# Patient Record
Sex: Female | Born: 1970
Health system: Southern US, Community
[De-identification: ages and names within clinical notes are randomized; demographics above are authoritative.]

## PROBLEM LIST (undated history)

## (undated) DIAGNOSIS — Z800 Family history of malignant neoplasm of digestive organs: Secondary | ICD-10-CM

## (undated) DIAGNOSIS — R0989 Other specified symptoms and signs involving the circulatory and respiratory systems: Secondary | ICD-10-CM

## (undated) DIAGNOSIS — J302 Other seasonal allergic rhinitis: Secondary | ICD-10-CM

## (undated) DIAGNOSIS — K2960 Other gastritis without bleeding: Secondary | ICD-10-CM

## (undated) DIAGNOSIS — Z923 Personal history of irradiation: Secondary | ICD-10-CM

## (undated) DIAGNOSIS — G40909 Epilepsy, unspecified, not intractable, without status epilepticus: Secondary | ICD-10-CM

## (undated) DIAGNOSIS — Z803 Family history of malignant neoplasm of breast: Secondary | ICD-10-CM

## (undated) DIAGNOSIS — D649 Anemia, unspecified: Secondary | ICD-10-CM

## (undated) DIAGNOSIS — Z808 Family history of malignant neoplasm of other organs or systems: Secondary | ICD-10-CM

## (undated) HISTORY — DX: Epilepsy, unspecified, not intractable, without status epilepticus: G40.909

## (undated) HISTORY — DX: Other seasonal allergic rhinitis: J30.2

## (undated) HISTORY — DX: Anemia, unspecified: D64.9

## (undated) HISTORY — DX: Personal history of irradiation: Z92.3

## (undated) HISTORY — DX: Family history of malignant neoplasm of other organs or systems: Z80.8

## (undated) HISTORY — DX: Family history of malignant neoplasm of digestive organs: Z80.0

## (undated) HISTORY — DX: Other specified symptoms and signs involving the circulatory and respiratory systems: R09.89

## (undated) HISTORY — PX: HERNIA REPAIR: SHX51

## (undated) HISTORY — PX: BREAST BIOPSY: SHX20

## (undated) HISTORY — DX: Family history of malignant neoplasm of breast: Z80.3

---

## 1998-01-05 HISTORY — PX: TUBAL LIGATION: SHX77

## 1999-01-06 HISTORY — PX: HAND SURGERY: SHX662

## 2006-01-05 HISTORY — PX: DILATION AND CURETTAGE OF UTERUS: SHX78

## 2007-02-09 ENCOUNTER — Emergency Department (HOSPITAL_COMMUNITY): Admission: EM | Admit: 2007-02-09 | Discharge: 2007-02-09 | Payer: Self-pay | Admitting: Emergency Medicine

## 2007-11-14 ENCOUNTER — Emergency Department (HOSPITAL_BASED_OUTPATIENT_CLINIC_OR_DEPARTMENT_OTHER): Admission: EM | Admit: 2007-11-14 | Discharge: 2007-11-14 | Payer: Self-pay | Admitting: Emergency Medicine

## 2010-07-16 ENCOUNTER — Emergency Department (HOSPITAL_COMMUNITY)
Admission: EM | Admit: 2010-07-16 | Discharge: 2010-07-17 | Disposition: A | Discharge status: Home / Self Care | Payer: Self-pay | Attending: Emergency Medicine | Admitting: Emergency Medicine

## 2010-07-16 DIAGNOSIS — R002 Palpitations: Secondary | ICD-10-CM | POA: Insufficient documentation

## 2010-07-16 DIAGNOSIS — R42 Dizziness and giddiness: Secondary | ICD-10-CM | POA: Insufficient documentation

## 2010-07-16 DIAGNOSIS — G40909 Epilepsy, unspecified, not intractable, without status epilepticus: Secondary | ICD-10-CM | POA: Insufficient documentation

## 2010-07-16 DIAGNOSIS — I4949 Other premature depolarization: Secondary | ICD-10-CM | POA: Insufficient documentation

## 2010-07-17 ENCOUNTER — Emergency Department (HOSPITAL_COMMUNITY)
Admission: EM | Admit: 2010-07-17 | Discharge: 2010-07-17 | Disposition: A | Discharge status: Home / Self Care | Payer: 59 | Attending: Emergency Medicine | Admitting: Emergency Medicine

## 2010-07-17 ENCOUNTER — Emergency Department (HOSPITAL_COMMUNITY): Payer: 59

## 2010-07-17 DIAGNOSIS — R002 Palpitations: Secondary | ICD-10-CM | POA: Insufficient documentation

## 2010-07-17 DIAGNOSIS — R42 Dizziness and giddiness: Secondary | ICD-10-CM | POA: Insufficient documentation

## 2010-07-17 DIAGNOSIS — I4949 Other premature depolarization: Secondary | ICD-10-CM | POA: Insufficient documentation

## 2010-07-17 DIAGNOSIS — G40909 Epilepsy, unspecified, not intractable, without status epilepticus: Secondary | ICD-10-CM | POA: Insufficient documentation

## 2010-07-17 LAB — BASIC METABOLIC PANEL
BUN: 12 mg/dL (ref 6–23)
CO2: 25 mEq/L (ref 19–32)
Calcium: 9.2 mg/dL (ref 8.4–10.5)
Chloride: 103 mEq/L (ref 96–112)
Creatinine, Ser: 0.57 mg/dL (ref 0.50–1.10)
GFR calc Af Amer: >60 mL/min (ref >60)
GFR calc non Af Amer: >60 mL/min (ref >60)
Glucose, Bld: 93 mg/dL (ref 70–99)
Potassium: 3.8 mEq/L (ref 3.5–5.1)
Sodium: 138 mEq/L (ref 135–145)

## 2010-07-17 LAB — COMPREHENSIVE METABOLIC PANEL
ALT: 20 U/L (ref 0–35)
AST: 18 U/L (ref 0–37)
Albumin: 3.8 g/dL (ref 3.5–5.2)
Alkaline Phosphatase: 49 U/L (ref 39–117)
BUN: 10 mg/dL (ref 6–23)
CO2: 24 mEq/L (ref 19–32)
Calcium: 9 mg/dL (ref 8.4–10.5)
Chloride: 105 mEq/L (ref 96–112)
Creatinine, Ser: 0.62 mg/dL (ref 0.50–1.10)
GFR calc Af Amer: >60 mL/min (ref >60)
GFR calc non Af Amer: >60 mL/min (ref >60)
Glucose, Bld: 104 mg/dL — ABNORMAL HIGH (ref 70–99)
Potassium: 3.5 mEq/L (ref 3.5–5.1)
Sodium: 139 mEq/L (ref 135–145)
Total Bilirubin: 0.4 mg/dL (ref 0.3–1.2)
Total Protein: 7.1 g/dL (ref 6.0–8.3)

## 2010-07-17 LAB — MAGNESIUM: Magnesium: 1.8 mg/dL (ref 1.5–2.5)

## 2010-07-17 LAB — TROPONIN I: Troponin I: <0.3 ng/mL (ref <0.30)

## 2010-07-23 ENCOUNTER — Encounter: Payer: Self-pay | Admitting: Internal Medicine

## 2010-07-23 ENCOUNTER — Ambulatory Visit (INDEPENDENT_AMBULATORY_CARE_PROVIDER_SITE_OTHER): Payer: 59 | Admitting: Internal Medicine

## 2010-07-23 VITALS — BP 113/68 | HR 74 | Ht 66.0 in | Wt 158.0 lb

## 2010-07-23 DIAGNOSIS — I4949 Other premature depolarization: Secondary | ICD-10-CM

## 2010-07-23 DIAGNOSIS — I493 Ventricular premature depolarization: Secondary | ICD-10-CM | POA: Insufficient documentation

## 2010-07-23 MED ORDER — VERAPAMIL HCL 120 MG PO TABS: 120.0000 mg | ORAL_TABLET | Freq: Three times a day (TID) | ORAL | Status: DC

## 2010-07-23 MED ORDER — METOPROLOL TARTRATE 25 MG PO TABS: 25.0000 mg | ORAL_TABLET | Freq: Two times a day (BID) | ORAL | Status: DC

## 2010-07-23 MED ORDER — ATENOLOL 25 MG PO TABS: 25.0000 mg | ORAL_TABLET | Freq: Every day | ORAL | Status: DC

## 2010-07-23 NOTE — Assessment & Plan Note (Signed)
The patient has been having quite symptomatic PVCs relatively abruptly in onset over the last week or so. Their morphology suggests a right bundle branch appearance consistent with a left ventricular origin. Clarification of the morphology would be helpful. As they've been somewhat responsive to exertion, stress testing will be undertaken to see if we can provoke them.  We'll also undertaken echo to look at left ventricular function to see if we can identify any problems that might have given rise to PVCs from that chamber which is somewhat unusual.  The relative infrequency would make catheter ablation difficult. In the event that she is intolerant of calcium blockers and/or beta blockers I would consider antiarrhythmic therapy once we know that ventricular function is normal. I told the patient that these are likely benign. Frequently they can wax and wane. Biofeedback may be part of a therapeutic strategy.

## 2010-07-23 NOTE — Progress Notes (Signed)
HPI: Jenny Hudson is a 40 y.o. female Seen at the request of the emergency room because of PVCs.  She first noted irregular palpitations 4 or 6 weeks ago they were transient. About a week ago they started returning. They're associated with shortness of breath lightheadedness and vague chest discomfort. He would come every 10-15 minutes or so. Occasionally he became as frequent as trigeminy. Because of symptoms that she presented herself to the emergency room where elective cardioversion was obtained according isolated beat. Laboratory evaluation included over 2 visits normal metabolic profile normal magnesium normal CBC and a normal chest x-ray and negative cardiac enzymes.  She denies significant change in stress or caffeine intake. She's had no problems with exercise tolerance. Current Outpatient Prescriptions  Medication Sig Dispense Refill  . metoprolol succinate (TOPROL-XL) 12.5 mg TB24 Take 12.5 mg by mouth 2 (two) times daily.          Allergies  Allergen Reactions  . Sulfa Antibiotics     Past Medical History  Diagnosis Date  . Chest pain   . Pulse irregularity   . Epilepsy   . Seasonal allergies   . Asthma   . Anemia     Past Surgical History  Procedure Date  . Dilation and curettage of uterus 2008  . Tubal ligation 2000  . Hand surgery 2001  . Hernia repair age 40    Family History  Problem Relation Age of Onset  . Breast cancer Mother     History   Social History  . Marital Status: Divorced    Spouse Name: N/A    Number of Children: 4  . Years of Education: N/A   Occupational History  . RN [employer]   Social History Main Topics  . Smoking status: Former Games developer  . Smokeless tobacco: Never Used   Comment: quit jan 2012  . Alcohol Use: Not on file  . Drug Use: Not on file  . Sexually Active: Not on file   Other Topics Concern  . Not on file   Social History Narrative  . No narrative on file    Fourteen point review of systems  was negative except as noted in HPI and PMH   PHYSICAL EXAMINATION  Blood pressure 113/68, pulse 74, height 5\' 6"  (1.676 m), weight 158 lb (71.668 kg).   Well developed and nourished Middle-aged Caucasian female in no acute distress HENT normal Neck supple with JVP-flat Carotids brisk and full without bruits Back without scoliosis or kyphosis Clear Regular rate and rhythm, no murmurs or gallops Abd-soft with active BS without hepatomegaly or midline pulsation Femoral pulses 2+ distal pulses intact No Clubbing cyanosis edema Skin-warm and dry LN-neg submandibular and supraclavicular A & Oriented CN 3-12 normal  Grossly normal sensory and motor function Affect engaging . Electrocardiogram dated 711 demonstrated sinus rhythm at 77 with intervals of 0.14/0.11/0.41 there is an RSR prime. PVC morphology was biphasic in lead V4 V5 and V6 with an increasing amplitude of the S-wave.

## 2010-07-23 NOTE — Patient Instructions (Signed)
Your physician recommends that you schedule a follow-up appointment in: 6 weeks  Your physician has requested that you have an echocardiogram. Echocardiography is a painless test that uses sound waves to create images of your heart. It provides your doctor with information about the size and shape of your heart and how well your heart's chambers and valves are working. This procedure takes approximately one hour. There are no restrictions for this procedure.  Your physician has requested that you have an exercise tolerance test. For further information please visit [email or web address] Please also follow instruction sheet, as given. With Tereso Newcomer or Dr Graciela Husbands  Your physician has recommended you make the following change in your medication: try Metoprolol Tartrate, Verapamil & Atenolol and let us know which one works best for you

## 2010-07-24 ENCOUNTER — Other Ambulatory Visit: Payer: 59 | Admitting: *Deleted

## 2010-07-28 ENCOUNTER — Other Ambulatory Visit (INDEPENDENT_AMBULATORY_CARE_PROVIDER_SITE_OTHER): Payer: 59 | Admitting: *Deleted

## 2010-07-28 DIAGNOSIS — I4949 Other premature depolarization: Secondary | ICD-10-CM

## 2010-07-28 DIAGNOSIS — I493 Ventricular premature depolarization: Secondary | ICD-10-CM

## 2010-07-28 LAB — TSH: TSH: 1.63 u[IU]/mL (ref 0.35–5.50)

## 2010-07-31 ENCOUNTER — Ambulatory Visit (HOSPITAL_COMMUNITY): Payer: 59 | Attending: Internal Medicine | Admitting: Radiology

## 2010-07-31 DIAGNOSIS — I079 Rheumatic tricuspid valve disease, unspecified: Secondary | ICD-10-CM | POA: Insufficient documentation

## 2010-07-31 DIAGNOSIS — I493 Ventricular premature depolarization: Secondary | ICD-10-CM

## 2010-07-31 DIAGNOSIS — I4949 Other premature depolarization: Secondary | ICD-10-CM | POA: Insufficient documentation

## 2010-08-06 ENCOUNTER — Institutional Professional Consult (permissible substitution): Payer: Self-pay | Admitting: Internal Medicine

## 2010-08-12 ENCOUNTER — Ambulatory Visit (INDEPENDENT_AMBULATORY_CARE_PROVIDER_SITE_OTHER): Payer: 59 | Admitting: Physician Assistant

## 2010-08-12 DIAGNOSIS — I493 Ventricular premature depolarization: Secondary | ICD-10-CM

## 2010-08-12 DIAGNOSIS — I4949 Other premature depolarization: Secondary | ICD-10-CM

## 2010-08-12 NOTE — Progress Notes (Signed)
Exercise Treadmill Test  Pre-Exercise Testing Evaluation Rhythm: normal sinus  Rate: 85   PR:  .13 QRS:  .10  QT:  .37 QTc: .44     Test  Exercise Tolerance Test Ordering MD: Sherryl Manges, MD  Interpreting MD:  Tereso Newcomer PA-C  Unique Test No: 1  Treadmill:  1  Indication for ETT: PALPS  Contraindication to ETT: No   Stress Modality: exercise - treadmill  Cardiac Imaging Performed: non   Protocol: standard Bruce - maximal  Max BP: 147/65  Max MPHR (bpm):  182 85% MPR (bpm):  154  MPHR obtained (bpm)144 % MPHR obtained: 90  Reached 85% MPHR (min:sec):  3:50 Total Exercise Time (min-sec): 6:00  Workload in METS:  10.1 Borg Scale: 15  Reason ETT Terminated:  patient's desire to stop    ST Segment Analysis At Rest: normal ST segments - no evidence of significant ST depression With Exercise: no evidence of significant ST depression  Other Information Arrhythmia:  Occasional PVCs Angina during ETT:  absent (0) Quality of ETT:  diagnostic  ETT Interpretation:  normal - no evidence of ischemia by ST analysis  Comments: Good exercise tolerance. Normal BP response to exercise. No chest pain. No ST-T changes to suggest ischemia. Occasional PVCs noted at rest and during exercise. No increase in frequency noted with exercise. No ventricular arrhythmias with exercise.  Recommendations: Follow up with Dr. Graciela Husbands as directed.

## 2010-09-26 LAB — POCT URINALYSIS DIP (DEVICE)
Glucose, UA: NEGATIVE
Nitrite: NEGATIVE
Operator id: 239701
Protein, ur: 30 — AB
Specific Gravity, Urine: >=1.03
Urobilinogen, UA: 1
pH: 5

## 2010-12-09 ENCOUNTER — Other Ambulatory Visit: Payer: Self-pay

## 2010-12-09 ENCOUNTER — Encounter (HOSPITAL_COMMUNITY): Payer: Self-pay | Admitting: *Deleted

## 2010-12-09 ENCOUNTER — Emergency Department (HOSPITAL_COMMUNITY): Payer: 59

## 2010-12-09 ENCOUNTER — Emergency Department (HOSPITAL_COMMUNITY)
Admission: EM | Admit: 2010-12-09 | Discharge: 2010-12-10 | Disposition: A | Discharge status: Home / Self Care | Payer: 59 | Attending: Emergency Medicine | Admitting: Emergency Medicine

## 2010-12-09 DIAGNOSIS — R002 Palpitations: Secondary | ICD-10-CM | POA: Insufficient documentation

## 2010-12-09 DIAGNOSIS — J45909 Unspecified asthma, uncomplicated: Secondary | ICD-10-CM | POA: Insufficient documentation

## 2010-12-09 DIAGNOSIS — R209 Unspecified disturbances of skin sensation: Secondary | ICD-10-CM | POA: Insufficient documentation

## 2010-12-09 DIAGNOSIS — R0789 Other chest pain: Secondary | ICD-10-CM

## 2010-12-09 DIAGNOSIS — Z79899 Other long term (current) drug therapy: Secondary | ICD-10-CM | POA: Insufficient documentation

## 2010-12-09 DIAGNOSIS — R42 Dizziness and giddiness: Secondary | ICD-10-CM | POA: Insufficient documentation

## 2010-12-09 DIAGNOSIS — G40909 Epilepsy, unspecified, not intractable, without status epilepticus: Secondary | ICD-10-CM | POA: Insufficient documentation

## 2010-12-09 LAB — COMPREHENSIVE METABOLIC PANEL
ALT: 14 U/L (ref 0–35)
AST: 15 U/L (ref 0–37)
Albumin: 4.2 g/dL (ref 3.5–5.2)
Alkaline Phosphatase: 47 U/L (ref 39–117)
BUN: 16 mg/dL (ref 6–23)
CO2: 22 mEq/L (ref 19–32)
Calcium: 9.2 mg/dL (ref 8.4–10.5)
Chloride: 104 mEq/L (ref 96–112)
Creatinine, Ser: 0.61 mg/dL (ref 0.50–1.10)
GFR calc Af Amer: >90 mL/min (ref >90)
GFR calc non Af Amer: >90 mL/min (ref >90)
Glucose, Bld: 88 mg/dL (ref 70–99)
Potassium: 3.7 mEq/L (ref 3.5–5.1)
Sodium: 136 mEq/L (ref 135–145)
Total Bilirubin: 0.2 mg/dL — ABNORMAL LOW (ref 0.3–1.2)
Total Protein: 7.4 g/dL (ref 6.0–8.3)

## 2010-12-09 LAB — CBC
HCT: 35.8 % — ABNORMAL LOW (ref 36.0–46.0)
Hemoglobin: 11.9 g/dL — ABNORMAL LOW (ref 12.0–15.0)
MCH: 27.3 pg (ref 26.0–34.0)
MCHC: 33.2 g/dL (ref 30.0–36.0)
MCV: 82.1 fL (ref 78.0–100.0)
Platelets: 215 10*3/uL (ref 150–400)
RBC: 4.36 MIL/uL (ref 3.87–5.11)
RDW: 14.9 % (ref 11.5–15.5)
WBC: 7.4 10*3/uL (ref 4.0–10.5)

## 2010-12-09 LAB — D-DIMER, QUANTITATIVE: D-Dimer, Quant: 0.48 ug/mL-FEU (ref 0.00–0.48)

## 2010-12-09 LAB — DIFFERENTIAL
Basophils Absolute: 0.1 10*3/uL (ref 0.0–0.1)
Basophils Relative: 1 % (ref 0–1)
Eosinophils Absolute: 0.4 10*3/uL (ref 0.0–0.7)
Eosinophils Relative: 5 % (ref 0–5)
Lymphocytes Relative: 45 % (ref 12–46)
Lymphs Abs: 3.3 10*3/uL (ref 0.7–4.0)
Monocytes Absolute: 0.7 10*3/uL (ref 0.1–1.0)
Monocytes Relative: 9 % (ref 3–12)
Neutro Abs: 3 10*3/uL (ref 1.7–7.7)
Neutrophils Relative %: 41 % — ABNORMAL LOW (ref 43–77)

## 2010-12-09 LAB — CARDIAC PANEL(CRET KIN+CKTOT+MB+TROPI)
CK, MB: 2 ng/mL (ref 0.3–4.0)
Relative Index: INVALID (ref 0.0–2.5)
Total CK: 75 U/L (ref 7–177)
Troponin I: <0.3 ng/mL (ref <0.30)

## 2010-12-09 MED ORDER — MORPHINE SULFATE 4 MG/ML IJ SOLN
4.0000 mg | Freq: Once | INTRAMUSCULAR | Status: AC
  Administered 2010-12-09: 2 mg via INTRAVENOUS
  Filled 2010-12-09: qty 1

## 2010-12-09 MED ORDER — MORPHINE SULFATE 2 MG/ML IJ SOLN
2.0000 mg | Freq: Once | INTRAMUSCULAR | Status: AC
  Administered 2010-12-09: 2 mg via INTRAVENOUS
  Filled 2010-12-09: qty 1

## 2010-12-09 MED ORDER — ONDANSETRON HCL 4 MG/2ML IJ SOLN
4.0000 mg | Freq: Once | INTRAMUSCULAR | Status: AC
  Administered 2010-12-09: 4 mg via INTRAVENOUS
  Filled 2010-12-09: qty 2

## 2010-12-09 MED ORDER — ASPIRIN 81 MG PO CHEW
324.0000 mg | CHEWABLE_TABLET | Freq: Once | ORAL | Status: AC
  Administered 2010-12-09: 324 mg via ORAL
  Filled 2010-12-09: qty 4

## 2010-12-09 NOTE — ED Provider Notes (Signed)
History     CSN: [id number] Arrival date & time: 12/09/2010  9:22 PM   First MD Initiated Contact with Patient 12/09/10 2125      Chief Complaint  Patient presents with  . Chest Pain    (Consider location/radiation/quality/duration/timing/severity/associated sxs/prior treatment) HPI  40 -year-old female previously healthy presents with chest pain. The patient is complaining of sudden onset left-sided sharp chest pain with radiation straight back to her scapula. She is experiencing intermittent left hand paresthesias.  She states that the pain "takes her breath away" she is not having any pleurisy. Describes the pain as 8/10 at this time. At the beginning of this episode she did have some lightheadedness. She states that she also feels like her heart is skipping a beat. She's had similar in the past and has been diagnosed with frequent PVCs has been prescribed metoprolol states that she has not taken it secondary to side effects. She denies history of hypertension, hyperlipidemia. There is no family history of heart attacks. Denies h/o VTE in self or family. No recent hosp/surg/immob. No h/o cancer. Denies exogenous hormone use, no leg pain or swelling.  Patient had a normal stress test in 08/2010 without induced ischemia. She had occasional PVCs noted at that time.  ED Notes, ED Provider Notes from 12/09/10 0000 to 12/09/10 21:26:18       Jillyn Ledger, RN 12/09/2010 21:25      Pt states that she was driving home and started to have chest pains and heart palpations. Pt states that she has a history of PVC's. Pt states that her pain radiated slightly to her left side of her chest and then her left hand was tingly. Pt states that the pain is an 8/10 and that she feels like it is taking her breath away.     Past Medical History  Diagnosis Date  . Chest pain   . Pulse irregularity   . Epilepsy   . Seasonal allergies   . Asthma   . Anemia     Past Surgical History  Procedure Date  .  Dilation and curettage of uterus 2008  . Tubal ligation 2000  . Hand surgery 2001  . Hernia repair age 28    Family History  Problem Relation Age of Onset  . Breast cancer Mother     History  Substance Use Topics  . Smoking status: Former Games developer  . Smokeless tobacco: Never Used   Comment: quit jan 2012  . Alcohol Use: Yes    OB History    Grav Para Term Preterm Abortions TAB SAB Ect Mult Living                  Review of Systems  All other systems reviewed and are negative.   except as noted HPI   Allergies  Sulfa antibiotics  Home Medications   Current Outpatient Rx  Name Route Sig Dispense Refill  . ATENOLOL 25 MG PO TABS Oral Take 25 mg by mouth daily.      Marland Kitchen METOPROLOL TARTRATE 25 MG PO TABS Oral Take 25 mg by mouth 2 (two) times daily.      Marland Kitchen VERAPAMIL HCL 120 MG PO TABS Oral Take 120 mg by mouth 3 (three) times daily.        BP 100/56  Pulse 79  Temp(Src) 98.2 F (36.8 C) (Oral)  Resp 12  SpO2 98% HR 74 by EKG  Physical Exam  Nursing note and vitals reviewed. Constitutional: She is oriented  to person, place, and time. She appears well-developed.  HENT:  Head: Atraumatic.  Mouth/Throat: Oropharynx is clear and moist.  Eyes: Conjunctivae and EOM are normal. Pupils are equal, round, and reactive to light.  Neck: Normal range of motion. Neck supple.  Cardiovascular: Normal rate, regular rhythm, normal heart sounds and intact distal pulses.   Pulmonary/Chest: Effort normal and breath sounds normal. No respiratory distress. She has no wheezes. She has no rales. She exhibits no tenderness.  Abdominal: Soft. She exhibits no distension. There is no tenderness. There is no rebound and no guarding.  Musculoskeletal: Normal range of motion.  Neurological: She is alert and oriented to person, place, and time.  Skin: Skin is warm and dry. No rash noted.  Psychiatric: She has a normal mood and affect.    Date: 12/09/2010  Rate: 74  Rhythm: normal sinus  rhythm  QRS Axis: normal  Intervals: normal  ST/T Wave abnormalities: normal  Conduction Disutrbances:none  Narrative Interpretation: incomplete RBBB, borderline inferior q waves, borderline short PR interval  Old EKG Reviewed: unchanged   ED Course  Procedures (including critical care time)  Labs Reviewed  CBC - Abnormal; Notable for the following:    Hemoglobin 11.9 (*)    HCT 35.8 (*)    All other components within normal limits  DIFFERENTIAL - Abnormal; Notable for the following:    Neutrophils Relative 41 (*)    All other components within normal limits  COMPREHENSIVE METABOLIC PANEL - Abnormal; Notable for the following:    Total Bilirubin 0.2 (*)    All other components within normal limits  CARDIAC PANEL(CRET KIN+CKTOT+MB+TROPI)  D-DIMER, QUANTITATIVE   Dg Chest 2 View  12/09/2010  *RADIOLOGY REPORT*  Clinical Data: Left-sided chest pain and pressure.  CHEST - 2 VIEW  Comparison: Chest radiograph performed 07/17/2010  Findings: The lungs are well-aerated and clear.  There is no evidence of focal opacification, pleural effusion or pneumothorax.  The heart is normal in size; the mediastinal contour is within normal limits.  No acute osseous abnormalities are seen.  IMPRESSION: No acute cardiopulmonary process seen.  Original Report Authenticated By: Tonia Ghent, M.D.    No diagnosis found.   MDM  Atypical chest pain with palpitations. CXR and EKG unremarkable. Dimer negative. Labs pending. Morphine, asa given. Reassess.  Pain 3/10 at this time. Repeat morphine. The patient has a TIMI 0 she had negative stress test in August of this year. She is also very lower for dissection, pulmonary embolism. She is a negative d-dimer here. There have been no episodes of arrhythmias noted here on the monitor. Plans for a second set of cardiac enzymes. Anticipate discharge home if her pain is controlled. F/U with Dr. Meredith Mody for PVCs  Signed out to Dr. Diamond Nickel,  MD 12/10/10 0002

## 2010-12-09 NOTE — ED Notes (Signed)
Pt states that she was driving home and started to have chest pains and heart palpations. Pt states that she has a history of PVC's. Pt states that her pain radiated slightly to her left side of her chest and then her left hand was tingly. Pt states that the pain is an 8/10 and that she feels like it is taking her breath away.

## 2010-12-09 NOTE — ED Notes (Signed)
New and old ECG to Dr Hyman Hopes

## 2010-12-09 NOTE — ED Notes (Signed)
Pt given 2mg  of morphine to complete dose of 4mg  ordered at 22:23. Pt states that the pain is a 3/10 still some achyness.

## 2010-12-10 LAB — TROPONIN I: Troponin I: <0.3 ng/mL (ref <0.30)

## 2012-03-10 ENCOUNTER — Encounter (HOSPITAL_COMMUNITY): Payer: Self-pay | Admitting: *Deleted

## 2012-03-10 ENCOUNTER — Emergency Department (HOSPITAL_COMMUNITY)
Admission: EM | Admit: 2012-03-10 | Discharge: 2012-03-10 | Disposition: A | Discharge status: Home Health | Payer: BC Managed Care – PPO | Attending: Emergency Medicine | Admitting: Emergency Medicine

## 2012-03-10 ENCOUNTER — Emergency Department (HOSPITAL_COMMUNITY): Payer: BC Managed Care – PPO

## 2012-03-10 DIAGNOSIS — J45909 Unspecified asthma, uncomplicated: Secondary | ICD-10-CM | POA: Insufficient documentation

## 2012-03-10 DIAGNOSIS — R002 Palpitations: Secondary | ICD-10-CM | POA: Insufficient documentation

## 2012-03-10 DIAGNOSIS — G40909 Epilepsy, unspecified, not intractable, without status epilepticus: Secondary | ICD-10-CM | POA: Insufficient documentation

## 2012-03-10 DIAGNOSIS — R5383 Other fatigue: Secondary | ICD-10-CM

## 2012-03-10 DIAGNOSIS — R079 Chest pain, unspecified: Secondary | ICD-10-CM | POA: Insufficient documentation

## 2012-03-10 DIAGNOSIS — Z87891 Personal history of nicotine dependence: Secondary | ICD-10-CM | POA: Insufficient documentation

## 2012-03-10 DIAGNOSIS — Z3202 Encounter for pregnancy test, result negative: Secondary | ICD-10-CM | POA: Insufficient documentation

## 2012-03-10 DIAGNOSIS — Z79899 Other long term (current) drug therapy: Secondary | ICD-10-CM | POA: Insufficient documentation

## 2012-03-10 DIAGNOSIS — R110 Nausea: Secondary | ICD-10-CM | POA: Insufficient documentation

## 2012-03-10 DIAGNOSIS — Z862 Personal history of diseases of the blood and blood-forming organs and certain disorders involving the immune mechanism: Secondary | ICD-10-CM | POA: Insufficient documentation

## 2012-03-10 DIAGNOSIS — R5381 Other malaise: Secondary | ICD-10-CM | POA: Insufficient documentation

## 2012-03-10 LAB — BASIC METABOLIC PANEL
BUN: 10 mg/dL (ref 6–23)
CO2: 23 mEq/L (ref 19–32)
Calcium: 9.3 mg/dL (ref 8.4–10.5)
Chloride: 104 mEq/L (ref 96–112)
Creatinine, Ser: 0.65 mg/dL (ref 0.50–1.10)
GFR calc Af Amer: >90 mL/min (ref >90)
GFR calc non Af Amer: >90 mL/min (ref >90)
Glucose, Bld: 90 mg/dL (ref 70–99)
Potassium: 4.2 mEq/L (ref 3.5–5.1)
Sodium: 139 mEq/L (ref 135–145)

## 2012-03-10 LAB — CBC
HCT: 34.8 % — ABNORMAL LOW (ref 36.0–46.0)
Hemoglobin: 11.7 g/dL — ABNORMAL LOW (ref 12.0–15.0)
MCH: 27.4 pg (ref 26.0–34.0)
MCHC: 33.6 g/dL (ref 30.0–36.0)
MCV: 81.5 fL (ref 78.0–100.0)
Platelets: 228 10*3/uL (ref 150–400)
RBC: 4.27 MIL/uL (ref 3.87–5.11)
RDW: 14.2 % (ref 11.5–15.5)
WBC: 5.3 10*3/uL (ref 4.0–10.5)

## 2012-03-10 LAB — HEPATIC FUNCTION PANEL
ALT: 17 U/L (ref 0–35)
AST: 18 U/L (ref 0–37)
Albumin: 3.9 g/dL (ref 3.5–5.2)
Alkaline Phosphatase: 43 U/L (ref 39–117)
Bilirubin, Direct: <0.1 mg/dL (ref 0.0–0.3)
Total Bilirubin: 0.4 mg/dL (ref 0.3–1.2)
Total Protein: 7.2 g/dL (ref 6.0–8.3)

## 2012-03-10 LAB — URINALYSIS, ROUTINE W REFLEX MICROSCOPIC
Bilirubin Urine: NEGATIVE
Glucose, UA: NEGATIVE mg/dL
Hgb urine dipstick: NEGATIVE
Ketones, ur: NEGATIVE mg/dL
Leukocytes, UA: NEGATIVE
Nitrite: NEGATIVE
Protein, ur: NEGATIVE mg/dL
Specific Gravity, Urine: 1.01 (ref 1.005–1.030)
Urobilinogen, UA: 1 mg/dL (ref 0.0–1.0)
pH: 7.5 (ref 5.0–8.0)

## 2012-03-10 LAB — POCT I-STAT TROPONIN I: Troponin i, poc: 0 ng/mL (ref 0.00–0.08)

## 2012-03-10 LAB — POCT PREGNANCY, URINE: Preg Test, Ur: NEGATIVE

## 2012-03-10 MED ORDER — ONDANSETRON HCL 4 MG/2ML IJ SOLN
4.0000 mg | Freq: Once | INTRAMUSCULAR | Status: AC
  Administered 2012-03-10: 4 mg via INTRAVENOUS
  Filled 2012-03-10: qty 2

## 2012-03-10 MED ORDER — NITROGLYCERIN 0.4 MG SL SUBL: 0.4000 mg | SUBLINGUAL_TABLET | SUBLINGUAL | Status: DC | PRN

## 2012-03-10 MED ORDER — KETOROLAC TROMETHAMINE 30 MG/ML IJ SOLN
30.0000 mg | Freq: Once | INTRAMUSCULAR | Status: AC
  Administered 2012-03-10: 30 mg via INTRAVENOUS
  Filled 2012-03-10: qty 1

## 2012-03-10 MED ORDER — SODIUM CHLORIDE 0.9 % IV BOLUS (SEPSIS)
1000.0000 mL | INTRAVENOUS | Status: AC
  Administered 2012-03-10: 1000 mL via INTRAVENOUS

## 2012-03-10 MED ORDER — NITROGLYCERIN 0.4 MG SL SUBL
0.4000 mg | SUBLINGUAL_TABLET | SUBLINGUAL | Status: DC | PRN
  Administered 2012-03-10: 0.4 mg via SUBLINGUAL
  Filled 2012-03-10 (×2): qty 25

## 2012-03-10 MED ORDER — ASPIRIN 325 MG PO TABS
325.0000 mg | ORAL_TABLET | ORAL | Status: AC
  Administered 2012-03-10: 325 mg via ORAL
  Filled 2012-03-10: qty 1

## 2012-03-10 MED ORDER — MELOXICAM 7.5 MG PO TABS: 7.5000 mg | ORAL_TABLET | Freq: Every day | ORAL | Status: DC

## 2012-03-10 NOTE — Discharge Instructions (Signed)
Cardiac Arrhythmia  Your heart is a muscle that works to pump blood through your body by regular contractions. The beating of your heart is controlled by a system of special pacemaker cells. These cells control the electrical activity of the heart. When the system controlling this regular beating is disturbed, a heart rhythm abnormality (arrhythmia) results.  WHEN YOUR HEART SKIPS A BEAT  One of the most common and least serious heart arrhythmias is called an ectopic or premature atrial heartbeat (PAC). This may be noticed as a small change in your regular pulse. A PAC originates from the top part (atrium) of the heart. Within the right atrium, the SA node is the area that normally controls the regularity of the heart. PACs occur in heart tissue outside of the SA node region. You may feel this as a skipped beat or heart flutter, especially if several occur in succession or occur frequently.   Another arrhythmia is ventricular premature complex (VCP or PVC). These extra beats start out in the bottom, more muscular chambers of the heart. In most cases a PVC is harmless. If there are underlying causes that are making the heart irritable such as an overactive thyroid or a prior heart attack PVCs may be of more concern. In a few cases, medications to control the heart rhythm may be prescribed.  Things to try at home:   Cut down or avoid alcohol, tobacco and caffeine.   Get enough sleep.   Reduce stress.   Exercise more.  WHEN THE HEART BEATS TOO FAST  Atrial tachycardia is a fast heart rate, which starts out in the atrium. It may last from minutes to much longer. Your heart may beat 140 to 240 times per minute instead of the normal 60 to 100.   Symptoms include a worried feeling (anxiety) and a sense that your heart is beating fast and hard.   You may be able to stop the fast rate by holding your breath or bearing down as if you were going to have a bowel movement.   This type of fast rate is usually not  dangerous.  Atrial fibrillation and atrial flutter are other fast rhythms that start in the atria. Both conditions keep the atria from filling with enough blood so the heart does not work well.   Symptoms include feeling light-headed or faint.   These fast rates may be the result of heart damage or disease. Too much thyroid hormone may play a role.   There may be no clear cause or it may be from heart disease or damage.   Medication or a special electrical treatment (cardioversion) may be needed to get the heart beating normally.  Ventricular tachycardia is a fast heart rate that starts in the lower muscular chambers (ventricles) This is a serious disorder that requires treatment as soon as possible. You need someone else to get and use a small defibrillator.   Symptoms include collapse, chest pain, or being short of breath.   Treatment may include medication, procedures to improve blood flow to the heart, or an implantable cardiac defibrillator (ICD).  DIAGNOSIS    A cardiogram (EKG or ECG) will be done to see the arrhythmia, as well as lab tests to check the underlying cause.   If the extra beats or fast rate come and go, you may wear a Holter monitor that records your heart rate for a longer period of time.  SEEK MEDICAL CARE IF:   You have irregular or fast heartbeats (palpitations).     you are already being treated. SEEK IMMEDIATE MEDICAL CARE IF:   You have severe chest pain, especially if the pain is crushing or pressure-like and spreads to the arms, back, neck, or jaw, or if you have sweating, feeling sick to your stomach (nausea), or shortness of breath. THIS IS AN EMERGENCY. Do not wait to see if the pain will go away. Get medical help at once. Call 911 or 0 (operator). DO NOT drive yourself to the hospital.  You feel dizzy or  faint.  You have episodes of previously documented atrial tachycardia that do not resolve with the techniques your caregiver has taught you.  Irregular or rapid heartbeats begin to occur more often than in the past, especially if they are associated with more pronounced symptoms or of longer duration. Document Released: 12/22/2004 Document Revised: 03/16/2011 Document Reviewed: 08/10/2007 [facility] Patient Information 2013 [place], [place].   Fatigue Fatigue is a feeling of tiredness, lack of energy, lack of motivation, or feeling tired all the time. Having enough rest, good nutrition, and reducing stress will normally reduce fatigue. Consult your caregiver if it persists. The nature of your fatigue will help your caregiver to find out its cause. The treatment is based on the cause.  CAUSES  There are many causes for fatigue. Most of the time, fatigue can be traced to one or more of your habits or routines. Most causes fit into one or more of three general areas. They are: Lifestyle problems  Sleep disturbances.  Overwork.  Physical exertion.  Unhealthy habits.  Poor eating habits or eating disorders.  Alcohol and/or drug use .  Lack of proper nutrition (malnutrition). Psychological problems  Stress and/or anxiety problems.  Depression.  Grief.  Boredom. Medical Problems or Conditions  Anemia.  Pregnancy.  Thyroid gland problems.  Recovery from major surgery.  Continuous pain.  Emphysema or asthma that is not well controlled  Allergic conditions.  Diabetes.  Infections (such as mononucleosis).  Obesity.  Sleep disorders, such as sleep apnea.  Heart failure or other heart-related problems.  Cancer.  Kidney disease.  Liver disease.  Effects of certain medicines such as antihistamines, cough and cold remedies, prescription pain medicines, heart and blood pressure medicines, drugs used for treatment of cancer, and some antidepressants. SYMPTOMS  The  symptoms of fatigue include:   Lack of energy.  Lack of drive (motivation).  Drowsiness.  Feeling of indifference to the surroundings. DIAGNOSIS  The details of how you feel help guide your caregiver in finding out what is causing the fatigue. You will be asked about your present and past health condition. It is important to review all medicines that you take, including prescription and non-prescription items. A thorough exam will be done. You will be questioned about your feelings, habits, and normal lifestyle. Your caregiver may suggest blood tests, urine tests, or other tests to look for common medical causes of fatigue.  TREATMENT  Fatigue is treated by correcting the underlying cause. For example, if you have continuous pain or depression, treating these causes will improve how you feel. Similarly, adjusting the dose of certain medicines will help in reducing fatigue.  HOME CARE INSTRUCTIONS   Try to get the required amount of good sleep every night.  Eat a healthy and nutritious diet, and drink enough water throughout the day.  Practice ways of relaxing (including yoga or meditation).  Exercise regularly.  Make plans to change situations that cause stress. Act on those plans so that stresses decrease over time. Keep your  work and personal routine reasonable.  Avoid street drugs and minimize use of alcohol.  Start taking a daily multivitamin after consulting your caregiver. SEEK MEDICAL CARE IF:   You have persistent tiredness, which cannot be accounted for.  You have fever.  You have unintentional weight loss.  You have headaches.  You have disturbed sleep throughout the night.  You are feeling sad.  You have constipation.  You have dry skin.  You have gained weight.  You are taking any new or different medicines that you suspect are causing fatigue.  You are unable to sleep at night.  You develop any unusual swelling of your legs or other parts of your  body. SEEK IMMEDIATE MEDICAL CARE IF:   You are feeling confused.  Your vision is blurred.  You feel faint or pass out.  You develop severe headache.  You develop severe abdominal, pelvic, or back pain.  You develop chest pain, shortness of breath, or an irregular or fast heartbeat.  You are unable to pass a normal amount of urine.  You develop abnormal bleeding such as bleeding from the rectum or you vomit blood.  You have thoughts about harming yourself or committing suicide.  You are worried that you might harm someone else. MAKE SURE YOU:   Understand these instructions.  Will watch your condition.  Will get help right away if you are not doing well or get worse. Document Released: 10/19/2006 Document Revised: 03/16/2011 Document Reviewed: 10/19/2006 [facility] Patient Information 2013 [place], [place].   Please take Mobic as directed for 7 days. Return to ED for new or worsening symptoms.

## 2012-03-10 NOTE — ED Notes (Signed)
Pt reports when she woke up this am, had dizziness before getting out of bed. Pt got in to shower and felt as if she fluttering in her chest. Boyfriend (EMS) reports radial pulses felt weak and thready. States palpable BP in 90's. Also reports excessive indigestion/belching for past 2 days. Reports shortness of breath, nausea.

## 2012-03-10 NOTE — ED Notes (Signed)
Pt resting and asking for pain medicine; RN notified; family at beside

## 2012-03-10 NOTE — ED Provider Notes (Signed)
History     CSN: [id number]  Arrival date & time 03/10/12  1191   First MD Initiated Contact with Patient 03/10/12 [phone]      Chief Complaint  Patient presents with  . Dizziness  . Chest Pain    (Consider location/radiation/quality/duration/timing/severity/associated sxs/prior treatment) HPI Comments: 42 yo female with history of BTL, childhood epilepsy not currently being treated, former smoker, presents with chest discomfort, palpitations, and generalized weakness. Had similar episode two years ago, evaluated by [facility]iology with negative treadmill stress test etc. Patient states that current episode began two days ago with indigestion. Yesterday and today with generalized weakness, intermittent palpitations, and vague chest discomfort. No hx of PE/DVT, and no risk factors. No history of CAD, negative stress 2 years ago.  Patient is a 42 y.o. female presenting with chest pain. The history is provided by the patient and a relative. No language interpreter was used.  Chest Pain Associated symptoms: nausea and weakness   Associated symptoms: no abdominal pain, no back pain, no cough, no fever, no headache, no numbness, no shortness of breath and not vomiting     Past Medical History  Diagnosis Date  . Chest pain   . Pulse irregularity   . Epilepsy   . Seasonal allergies   . Asthma   . Anemia     Past Surgical History  Procedure Laterality Date  . Dilation and curettage of uterus  2008  . Tubal ligation  2000  . Hand surgery  2001  . Hernia repair  age 63    Family History  Problem Relation Age of Onset  . Breast cancer Mother     History  Substance Use Topics  . Smoking status: Former Games developer  . Smokeless tobacco: Never Used     Comment: quit jan 2012  . Alcohol Use: Yes    OB History   Grav Para Term Preterm Abortions TAB SAB Ect Mult Living                  Review of Systems  Constitutional: Negative for fever and chills.  HENT: Negative for sore  throat and neck pain.   Eyes: Negative for visual disturbance.  Respiratory: Negative for cough and shortness of breath.   Cardiovascular: Positive for chest pain.  Gastrointestinal: Positive for nausea. Negative for vomiting, abdominal pain and diarrhea.  Genitourinary: Negative for dysuria and frequency.  Musculoskeletal: Negative for back pain.  Skin: Negative for rash.  Neurological: Positive for weakness and light-headedness. Negative for numbness and headaches.  Hematological: Negative for adenopathy.  Psychiatric/Behavioral: Negative for behavioral problems.    Allergies  Sulfa antibiotics  Home Medications   Current Outpatient Rx  Name  Route  Sig  Dispense  Refill  . Nutritional Supplements (JUICE PLUS FIBRE PO)   Oral   Take 6 capsules by mouth daily.         . meloxicam (MOBIC) 7.5 MG tablet   Oral   Take 1 tablet (7.5 mg total) by mouth daily.   7 tablet   0     BP 106/56  Pulse 65  Temp(Src) 97.6 F (36.4 C) (Oral)  Resp 12  SpO2 98%  LMP 02/29/2012  Physical Exam  Nursing note and vitals reviewed. Constitutional: She appears well-developed and well-nourished. No distress.  ---------------------------              03/10/12  0952        ---------------------------  BP:          114/75        Pulse:         75          Temp:   97.6 F (36.4 C)  Resp:          14         --------------------------- Patient is not distressed. She appears to feel tired.  HENT:  Head: Normocephalic and atraumatic.  Mouth/Throat: Oropharynx is clear and moist. No oropharyngeal exudate.  Eyes: Conjunctivae and EOM are normal. Pupils are equal, round, and reactive to light. Right eye exhibits no discharge. Left eye exhibits no discharge. No scleral icterus.  Neck: Normal range of motion. Neck supple. No JVD present. No thyromegaly present.  Cardiovascular: Normal rate, regular rhythm, normal heart sounds and intact distal  pulses.  Exam reveals no gallop and no friction rub.   No murmur heard. Pulmonary/Chest: Effort normal and breath sounds normal. No respiratory distress. She has no wheezes. She has no rales.  Abdominal: Soft. Bowel sounds are normal. She exhibits no distension and no mass. There is no tenderness.  Musculoskeletal: Normal range of motion. She exhibits no edema and no tenderness.  Lymphadenopathy:    She has no cervical adenopathy.  Neurological: She is alert. Coordination normal.  Skin: Skin is warm and dry. No rash noted. No erythema.  Psychiatric: She has a normal mood and affect. Her behavior is normal.    ED Course  Procedures (including critical care time)  Labs Reviewed  CBC - Abnormal; Notable for the following:    Hemoglobin 11.7 (*)    HCT 34.8 (*)    All other components within normal limits  URINALYSIS, ROUTINE W REFLEX MICROSCOPIC - Abnormal; Notable for the following:    APPearance HAZY (*)    All other components within normal limits  BASIC METABOLIC PANEL  HEPATIC FUNCTION PANEL  POCT I-STAT TROPONIN I  POCT PREGNANCY, URINE   Dg Chest 2 View  03/10/2012  *RADIOLOGY REPORT*  Clinical Data: Chest pain shortness of breath.  Dizziness.  CHEST - 2 VIEW  Comparison: Chest x-ray with 12/09/2010.  Findings: Lung volumes are normal.  No consolidative airspace disease.  No pleural effusions.  No pneumothorax.  No pulmonary nodule or mass noted.  Pulmonary vasculature and the cardiomediastinal silhouette are within normal limits.  IMPRESSION: 1. No radiographic evidence of acute cardiopulmonary disease.   Original Report Authenticated By: Trudie Reed, M.D.      1. Fatigue   2. Palpitations       MDM  42 yo female with non specific symptoms. DDx: dehydration, UTI, pneumonia, ACS, electrolyte abnormality, liver dysfunction. EKG NSR, no ischemic changes. Labs, fluids, Zofran, CXR, UA. Will reassess patient and discuss admission with patient if labs are within normal  limits.  Ms Pernell Dupre has some generalized weakness but has normal VS, is afebrile, cardiac monitoring without arrhythmia, ectopy or other abnormality and labs are normal and reassuring without evidence of dehydration, electrolyte abnormality or anemia.  Unsure of etiology - pt is amenable to return should her sx worsen or change.  I have personally interpreted the 2 view chest with PA and lateral views showing no signs of acute infiltrate, pulmonary edema, abnormal mediastinum or pneumothorax.       Vida Roller, MD 03/11/12 [phone]

## 2012-03-13 ENCOUNTER — Inpatient Hospital Stay (HOSPITAL_COMMUNITY)
Admission: EM | Admit: 2012-03-13 | Discharge: 2012-03-16 | DRG: 183 | Disposition: A | Discharge status: Home / Self Care | Payer: BC Managed Care – PPO | Attending: Internal Medicine | Admitting: Internal Medicine

## 2012-03-13 ENCOUNTER — Encounter (HOSPITAL_COMMUNITY): Payer: Self-pay | Admitting: Emergency Medicine

## 2012-03-13 DIAGNOSIS — Z882 Allergy status to sulfonamides status: Secondary | ICD-10-CM

## 2012-03-13 DIAGNOSIS — K219 Gastro-esophageal reflux disease without esophagitis: Secondary | ICD-10-CM | POA: Diagnosis present

## 2012-03-13 DIAGNOSIS — K5900 Constipation, unspecified: Secondary | ICD-10-CM | POA: Diagnosis present

## 2012-03-13 DIAGNOSIS — N760 Acute vaginitis: Secondary | ICD-10-CM | POA: Diagnosis present

## 2012-03-13 DIAGNOSIS — A499 Bacterial infection, unspecified: Secondary | ICD-10-CM | POA: Diagnosis present

## 2012-03-13 DIAGNOSIS — G40909 Epilepsy, unspecified, not intractable, without status epilepticus: Secondary | ICD-10-CM | POA: Diagnosis present

## 2012-03-13 DIAGNOSIS — I4949 Other premature depolarization: Secondary | ICD-10-CM | POA: Diagnosis present

## 2012-03-13 DIAGNOSIS — T3995XA Adverse effect of unspecified nonopioid analgesic, antipyretic and antirheumatic, initial encounter: Secondary | ICD-10-CM | POA: Diagnosis present

## 2012-03-13 DIAGNOSIS — Z87891 Personal history of nicotine dependence: Secondary | ICD-10-CM

## 2012-03-13 DIAGNOSIS — Y92009 Unspecified place in unspecified non-institutional (private) residence as the place of occurrence of the external cause: Secondary | ICD-10-CM

## 2012-03-13 DIAGNOSIS — Z79899 Other long term (current) drug therapy: Secondary | ICD-10-CM

## 2012-03-13 DIAGNOSIS — E860 Dehydration: Secondary | ICD-10-CM | POA: Diagnosis present

## 2012-03-13 DIAGNOSIS — R112 Nausea with vomiting, unspecified: Secondary | ICD-10-CM | POA: Diagnosis present

## 2012-03-13 DIAGNOSIS — J45909 Unspecified asthma, uncomplicated: Secondary | ICD-10-CM | POA: Diagnosis present

## 2012-03-13 DIAGNOSIS — R1110 Vomiting, unspecified: Secondary | ICD-10-CM

## 2012-03-13 DIAGNOSIS — I493 Ventricular premature depolarization: Secondary | ICD-10-CM

## 2012-03-13 DIAGNOSIS — R1013 Epigastric pain: Secondary | ICD-10-CM

## 2012-03-13 DIAGNOSIS — B9689 Other specified bacterial agents as the cause of diseases classified elsewhere: Secondary | ICD-10-CM | POA: Diagnosis present

## 2012-03-13 DIAGNOSIS — K2960 Other gastritis without bleeding: Secondary | ICD-10-CM | POA: Diagnosis present

## 2012-03-13 HISTORY — DX: Other gastritis without bleeding: K29.60

## 2012-03-13 LAB — URINALYSIS, MICROSCOPIC ONLY
Bilirubin Urine: NEGATIVE
Glucose, UA: NEGATIVE mg/dL
Hgb urine dipstick: NEGATIVE
Ketones, ur: 15 mg/dL — AB
Nitrite: NEGATIVE
Protein, ur: NEGATIVE mg/dL
Specific Gravity, Urine: 1.019 (ref 1.005–1.030)
Urobilinogen, UA: 1 mg/dL (ref 0.0–1.0)
pH: 5.5 (ref 5.0–8.0)

## 2012-03-13 LAB — CBC WITH DIFFERENTIAL/PLATELET
Basophils Absolute: 0.1 10*3/uL (ref 0.0–0.1)
Basophils Relative: 1 % (ref 0–1)
Eosinophils Absolute: 0.4 10*3/uL (ref 0.0–0.7)
Eosinophils Relative: 5 % (ref 0–5)
HCT: 35.1 % — ABNORMAL LOW (ref 36.0–46.0)
Hemoglobin: 12 g/dL (ref 12.0–15.0)
Lymphocytes Relative: 38 % (ref 12–46)
Lymphs Abs: 2.9 10*3/uL (ref 0.7–4.0)
MCH: 28.1 pg (ref 26.0–34.0)
MCHC: 34.2 g/dL (ref 30.0–36.0)
MCV: 82.2 fL (ref 78.0–100.0)
Monocytes Absolute: 0.5 10*3/uL (ref 0.1–1.0)
Monocytes Relative: 7 % (ref 3–12)
Neutro Abs: 3.9 10*3/uL (ref 1.7–7.7)
Neutrophils Relative %: 51 % (ref 43–77)
Platelets: 228 10*3/uL (ref 150–400)
RBC: 4.27 MIL/uL (ref 3.87–5.11)
RDW: 13.9 % (ref 11.5–15.5)
WBC: 7.8 10*3/uL (ref 4.0–10.5)

## 2012-03-13 LAB — COMPREHENSIVE METABOLIC PANEL
ALT: 16 U/L (ref 0–35)
AST: 17 U/L (ref 0–37)
Albumin: 4.4 g/dL (ref 3.5–5.2)
Alkaline Phosphatase: 45 U/L (ref 39–117)
BUN: 14 mg/dL (ref 6–23)
CO2: 26 mEq/L (ref 19–32)
Calcium: 9.4 mg/dL (ref 8.4–10.5)
Chloride: 103 mEq/L (ref 96–112)
Creatinine, Ser: 0.69 mg/dL (ref 0.50–1.10)
GFR calc Af Amer: >90 mL/min (ref >90)
GFR calc non Af Amer: >90 mL/min (ref >90)
Glucose, Bld: 81 mg/dL (ref 70–99)
Potassium: 4 mEq/L (ref 3.5–5.1)
Sodium: 140 mEq/L (ref 135–145)
Total Bilirubin: 0.4 mg/dL (ref 0.3–1.2)
Total Protein: 7.8 g/dL (ref 6.0–8.3)

## 2012-03-13 LAB — LIPASE, BLOOD: Lipase: 32 U/L (ref 11–59)

## 2012-03-13 LAB — POCT I-STAT TROPONIN I: Troponin i, poc: 0.01 ng/mL (ref 0.00–0.08)

## 2012-03-13 MED ORDER — GI COCKTAIL ~~LOC~~
30.0000 mL | Freq: Once | ORAL | Status: AC
  Administered 2012-03-13: 30 mL via ORAL
  Filled 2012-03-13: qty 30

## 2012-03-13 MED ORDER — ONDANSETRON HCL 4 MG/2ML IJ SOLN
4.0000 mg | Freq: Once | INTRAMUSCULAR | Status: AC
  Administered 2012-03-13: 4 mg via INTRAVENOUS
  Filled 2012-03-13: qty 2

## 2012-03-13 MED ORDER — METOCLOPRAMIDE HCL 5 MG/ML IJ SOLN
5.0000 mg | Freq: Once | INTRAMUSCULAR | Status: AC
  Administered 2012-03-13: 5 mg via INTRAVENOUS
  Filled 2012-03-13: qty 2

## 2012-03-13 MED ORDER — MORPHINE SULFATE 4 MG/ML IJ SOLN
4.0000 mg | Freq: Once | INTRAMUSCULAR | Status: AC
  Administered 2012-03-13: 4 mg via INTRAVENOUS
  Filled 2012-03-13: qty 1

## 2012-03-13 MED ORDER — IOHEXOL 300 MG/ML  SOLN
50.0000 mL | Freq: Once | INTRAMUSCULAR | Status: AC | PRN
  Administered 2012-03-13: 50 mL via ORAL

## 2012-03-13 NOTE — ED Notes (Signed)
Pt c/o mid-epigastric pain and n/v. Pt was here on Thursday. On Friday it has become more GI related. Pt denies diarrhea. Pt reports she is unable to keep food or fluids down. Rates pain at 8/10 with sometimes more intense pain.

## 2012-03-13 NOTE — ED Notes (Addendum)
C/o epigastric pain, nausea, and vomiting since yesterday. Seen in ED 3/6 and diagnosed with fatigue and palpitations.

## 2012-03-13 NOTE — ED Provider Notes (Signed)
History     CSN: [id number]  Arrival date & time 03/13/12  2122   First MD Initiated Contact with Patient 03/13/12 2157      Chief Complaint  Patient presents with  . Abdominal Pain  . Emesis    (Consider location/radiation/quality/duration/timing/severity/associated sxs/prior treatment) HPI Comments: This is a 42 year old female, who presents emergency department with chief complaint of abdominal pain. She also complains of associated nausea and vomiting. She states the symptoms began on Friday, and have progressively worsened. She states that she is unable to keep any fluids down. She rates her pain at 8/10. It is worsened with eating. She denies any fever, chills, diarrhea, constipation, dysuria, or vaginal discharge. She has not tried taking anything to alleviate her symptoms. She states the pain is worse on the right side of her abdomen.  The history is provided by the patient. No language interpreter was used.    Past Medical History  Diagnosis Date  . Chest pain   . Pulse irregularity   . Epilepsy   . Seasonal allergies   . Asthma   . Anemia     Past Surgical History  Procedure Laterality Date  . Dilation and curettage of uterus  2008  . Tubal ligation  2000  . Hand surgery  2001  . Hernia repair  age 83    Family History  Problem Relation Age of Onset  . Breast cancer Mother     History  Substance Use Topics  . Smoking status: Former Games developer  . Smokeless tobacco: Never Used     Comment: quit jan 2012  . Alcohol Use: Yes    OB History   Grav Para Term Preterm Abortions TAB SAB Ect Mult Living                  Review of Systems  All other systems reviewed and are negative.    Allergies  Sulfa antibiotics  Home Medications   Current Outpatient Rx  Name  Route  Sig  Dispense  Refill  . meloxicam (MOBIC) 7.5 MG tablet   Oral   Take 1 tablet (7.5 mg total) by mouth daily.   7 tablet   0   . Nutritional Supplements (JUICE PLUS FIBRE PO)  Oral   Take 6 capsules by mouth daily.         . verapamil (CALAN) 120 MG tablet   Oral   Take 120 mg by mouth daily as needed (for PVC's).           BP 110/64  Pulse 65  Temp(Src) 98.3 F (36.8 C) (Oral)  Resp 16  SpO2 100%  LMP 02/29/2012  Physical Exam  Nursing note and vitals reviewed. Constitutional: She is oriented to person, place, and time. She appears well-developed and well-nourished.  HENT:  Head: Normocephalic and atraumatic.  Eyes: Conjunctivae and EOM are normal. Pupils are equal, round, and reactive to light.  Neck: Normal range of motion. Neck supple.  Cardiovascular: Normal rate and regular rhythm.  Exam reveals no gallop and no friction rub.   No murmur heard. Pulmonary/Chest: Effort normal and breath sounds normal. No respiratory distress. She has no wheezes. She has no rales. She exhibits no tenderness.  Abdominal: Soft. Bowel sounds are normal. She exhibits no distension and no mass. There is tenderness. There is no rebound and no guarding. Hernia confirmed negative in the right inguinal area and confirmed negative in the left inguinal area.  Patient is moderately tender in the right  lower quadrant, no rebound tenderness, no right upper quadrant tenderness or Murphy's sign, no left lower quadrant tenderness, no signs of peritonitis.  Genitourinary: No labial fusion. There is no rash, tenderness, lesion or injury on the right labia. There is no rash, tenderness, lesion or injury on the left labia. Cervix exhibits no motion tenderness, no discharge and no friability. Right adnexum displays no mass, no tenderness and no fullness. Left adnexum displays no mass, no tenderness and no fullness. No erythema, tenderness or bleeding around the vagina. No foreign body around the vagina. No signs of injury around the vagina. No vaginal discharge found.  Musculoskeletal: Normal range of motion. She exhibits no edema and no tenderness.  Lymphadenopathy:       Right: No  inguinal adenopathy present.       Left: No inguinal adenopathy present.  Neurological: She is alert and oriented to person, place, and time.  Skin: Skin is warm and dry.  Psychiatric: She has a normal mood and affect. Her behavior is normal. Judgment and thought content normal.    ED Course  Procedures (including critical care time)  Labs Reviewed  CBC WITH DIFFERENTIAL - Abnormal; Notable for the following:    HCT 35.1 (*)    All other components within normal limits  URINALYSIS, MICROSCOPIC ONLY - Abnormal; Notable for the following:    APPearance TURBID (*)    Ketones, ur 15 (*)    Leukocytes, UA SMALL (*)    Bacteria, UA MANY (*)    Squamous Epithelial / LPF MANY (*)    All other components within normal limits  URINE CULTURE  COMPREHENSIVE METABOLIC PANEL  LIPASE, BLOOD  POCT I-STAT TROPONIN I   Results for orders placed during the hospital encounter of 03/13/12  CBC WITH DIFFERENTIAL      Result Value Range   WBC 7.8  4.0 - 10.5 K/uL   RBC 4.27  3.87 - 5.11 MIL/uL   Hemoglobin 12.0  12.0 - 15.0 g/dL   HCT 14.7 (*) 82.9 - 56.2 %   MCV 82.2  78.0 - 100.0 fL   MCH 28.1  26.0 - 34.0 pg   MCHC 34.2  30.0 - 36.0 g/dL   RDW 13.0  86.5 - 78.4 %   Platelets 228  150 - 400 K/uL   Neutrophils Relative 51  43 - 77 %   Neutro Abs 3.9  1.7 - 7.7 K/uL   Lymphocytes Relative 38  12 - 46 %   Lymphs Abs 2.9  0.7 - 4.0 K/uL   Monocytes Relative 7  3 - 12 %   Monocytes Absolute 0.5  0.1 - 1.0 K/uL   Eosinophils Relative 5  0 - 5 %   Eosinophils Absolute 0.4  0.0 - 0.7 K/uL   Basophils Relative 1  0 - 1 %   Basophils Absolute 0.1  0.0 - 0.1 K/uL  COMPREHENSIVE METABOLIC PANEL      Result Value Range   Sodium 140  135 - 145 mEq/L   Potassium 4.0  3.5 - 5.1 mEq/L   Chloride 103  96 - 112 mEq/L   CO2 26  19 - 32 mEq/L   Glucose, Bld 81  70 - 99 mg/dL   BUN 14  6 - 23 mg/dL   Creatinine, Ser 6.96  0.50 - 1.10 mg/dL   Calcium 9.4  8.4 - 29.5 mg/dL   Total Protein 7.8  6.0 - 8.3  g/dL   Albumin 4.4  3.5 - 5.2  g/dL   AST 17  0 - 37 U/L   ALT 16  0 - 35 U/L   Alkaline Phosphatase 45  39 - 117 U/L   Total Bilirubin 0.4  0.3 - 1.2 mg/dL   GFR calc non Af Amer >90  >90 mL/min   GFR calc Af Amer >90  >90 mL/min  LIPASE, BLOOD      Result Value Range   Lipase 32  11 - 59 U/L  URINALYSIS, MICROSCOPIC ONLY      Result Value Range   Color, Urine YELLOW  YELLOW   APPearance TURBID (*) CLEAR   Specific Gravity, Urine 1.019  1.005 - 1.030   pH 5.5  5.0 - 8.0   Glucose, UA NEGATIVE  NEGATIVE mg/dL   Hgb urine dipstick NEGATIVE  NEGATIVE   Bilirubin Urine NEGATIVE  NEGATIVE   Ketones, ur 15 (*) NEGATIVE mg/dL   Protein, ur NEGATIVE  NEGATIVE mg/dL   Urobilinogen, UA 1.0  0.0 - 1.0 mg/dL   Nitrite NEGATIVE  NEGATIVE   Leukocytes, UA SMALL (*) NEGATIVE   WBC, UA 3-6  <3 WBC/hpf   RBC / HPF 0-2  <3 RBC/hpf   Bacteria, UA MANY (*) RARE   Squamous Epithelial / LPF MANY (*) RARE  PREGNANCY, URINE      Result Value Range   Preg Test, Ur NEGATIVE  NEGATIVE  POCT I-STAT TROPONIN I      Result Value Range   Troponin i, poc 0.01  0.00 - 0.08 ng/mL   Comment 3            Dg Chest 2 View  03/10/2012  *RADIOLOGY REPORT*  Clinical Data: Chest pain shortness of breath.  Dizziness.  CHEST - 2 VIEW  Comparison: Chest x-ray with 12/09/2010.  Findings: Lung volumes are normal.  No consolidative airspace disease.  No pleural effusions.  No pneumothorax.  No pulmonary nodule or mass noted.  Pulmonary vasculature and the cardiomediastinal silhouette are within normal limits.  IMPRESSION: 1. No radiographic evidence of acute cardiopulmonary disease.   Original Report Authenticated By: Trudie Reed, M.D.    Ct Abdomen Pelvis W Contrast  03/14/2012  *RADIOLOGY REPORT*  Clinical Data: Epigastric and right lower quadrant pain, nausea, and vomiting since yesterday  CT ABDOMEN AND PELVIS WITH CONTRAST  Technique:  Multidetector CT imaging of the abdomen and pelvis was performed following the  standard protocol during bolus administration of intravenous contrast. Sagittal and coronal MPR images reconstructed from axial data set.  Contrast: OMNIPAQUE IOHEXOL 300 MG/ML  SOLN Dilute oral contrast.  Comparison: None  Findings: Minimal atelectasis at lung bases. Probable 5 mm cyst right lobe liver image 25. Liver, spleen, pancreas, kidneys, and adrenal glands otherwise normal. Normal appendix. Probable small cyst left ovary 2.4 x 1.7 cm image 63. Bladder, ureters, uterus and adnexae otherwise unremarkable. Increased stool throughout colon. Stomach and bowel loops otherwise grossly normal appearance. No mass, adenopathy, free fluid or inflammatory process. Potential small hemangioma within L4 vertebral body. No acute osseous lesions.  IMPRESSION: No definite acute intra-abdominal or intrapelvic abnormalities. Increased stool throughout colon. Probable small left ovarian cyst.   Original Report Authenticated By: Ulyses Southward, M.D.      ED ECG REPORT  I personally interpreted this EKG   Date: 03/14/2012   Rate: 76  Rhythm: normal sinus rhythm  QRS Axis: normal  Intervals: normal  ST/T Wave abnormalities: normal  Conduction Disutrbances:none  Narrative Interpretation:   Old EKG Reviewed: none available  No diagnosis found.    MDM    Patient seen by and discussed with Dr. Ethelda Chick.  Believe this to be GERD or possibly and ulcer.    Dr. Ethelda Chick will resume care at this time.  Plan is fluid challenge.        Roxy Horseman, PA-C 03/14/12 [phone]

## 2012-03-13 NOTE — ED Notes (Signed)
Pt denies tenderness to palpation of abd, pt denies vaginal d/c. Pt reports she is unsure when her LBM was, thinks about 4 days ago. No diarrhea. Pt denies urinary symptoms.

## 2012-03-14 ENCOUNTER — Observation Stay (HOSPITAL_COMMUNITY): Payer: BC Managed Care – PPO

## 2012-03-14 ENCOUNTER — Emergency Department (HOSPITAL_COMMUNITY): Payer: BC Managed Care – PPO

## 2012-03-14 ENCOUNTER — Encounter (HOSPITAL_COMMUNITY): Payer: Self-pay | Admitting: Radiology

## 2012-03-14 DIAGNOSIS — K219 Gastro-esophageal reflux disease without esophagitis: Secondary | ICD-10-CM | POA: Diagnosis present

## 2012-03-14 DIAGNOSIS — N760 Acute vaginitis: Secondary | ICD-10-CM

## 2012-03-14 DIAGNOSIS — R1115 Cyclical vomiting syndrome unrelated to migraine: Secondary | ICD-10-CM

## 2012-03-14 DIAGNOSIS — E860 Dehydration: Secondary | ICD-10-CM

## 2012-03-14 DIAGNOSIS — R52 Pain, unspecified: Secondary | ICD-10-CM

## 2012-03-14 DIAGNOSIS — R112 Nausea with vomiting, unspecified: Secondary | ICD-10-CM | POA: Diagnosis present

## 2012-03-14 DIAGNOSIS — B9689 Other specified bacterial agents as the cause of diseases classified elsewhere: Secondary | ICD-10-CM | POA: Diagnosis present

## 2012-03-14 DIAGNOSIS — I4949 Other premature depolarization: Secondary | ICD-10-CM

## 2012-03-14 DIAGNOSIS — R1013 Epigastric pain: Secondary | ICD-10-CM | POA: Diagnosis present

## 2012-03-14 LAB — WET PREP, GENITAL
Trich, Wet Prep: NONE SEEN
Yeast Wet Prep HPF POC: NONE SEEN

## 2012-03-14 LAB — URINALYSIS, ROUTINE W REFLEX MICROSCOPIC
Bilirubin Urine: NEGATIVE
Glucose, UA: NEGATIVE mg/dL
Hgb urine dipstick: NEGATIVE
Ketones, ur: NEGATIVE mg/dL
Leukocytes, UA: NEGATIVE
Nitrite: NEGATIVE
Protein, ur: NEGATIVE mg/dL
Specific Gravity, Urine: 1.012 (ref 1.005–1.030)
Urobilinogen, UA: 0.2 mg/dL (ref 0.0–1.0)
pH: 5 (ref 5.0–8.0)

## 2012-03-14 LAB — PREGNANCY, URINE: Preg Test, Ur: NEGATIVE

## 2012-03-14 MED ORDER — ALUM & MAG HYDROXIDE-SIMETH 200-200-20 MG/5ML PO SUSP: 30.0000 mL | Freq: Four times a day (QID) | ORAL | Status: DC | PRN

## 2012-03-14 MED ORDER — ONDANSETRON HCL 4 MG/2ML IJ SOLN
4.0000 mg | Freq: Once | INTRAMUSCULAR | Status: AC
  Administered 2012-03-14: 4 mg via INTRAVENOUS
  Filled 2012-03-14: qty 2

## 2012-03-14 MED ORDER — METRONIDAZOLE IN NACL 5-0.79 MG/ML-% IV SOLN
500.0000 mg | Freq: Two times a day (BID) | INTRAVENOUS | Status: DC
  Administered 2012-03-14 – 2012-03-16 (×4): 500 mg via INTRAVENOUS
  Filled 2012-03-14 (×6): qty 100

## 2012-03-14 MED ORDER — POTASSIUM CHLORIDE IN NACL 20-0.9 MEQ/L-% IV SOLN
INTRAVENOUS | Status: DC
  Administered 2012-03-14: 05:00:00 via INTRAVENOUS
  Filled 2012-03-14 (×2): qty 1000

## 2012-03-14 MED ORDER — SODIUM CHLORIDE 0.9 % IV SOLN: INTRAVENOUS | Status: DC

## 2012-03-14 MED ORDER — BIOTENE DRY MOUTH MT LIQD
15.0000 mL | Freq: Two times a day (BID) | OROMUCOSAL | Status: DC
  Administered 2012-03-14 – 2012-03-16 (×3): 15 mL via OROMUCOSAL

## 2012-03-14 MED ORDER — PANTOPRAZOLE SODIUM 40 MG IV SOLR
40.0000 mg | INTRAVENOUS | Status: DC
  Administered 2012-03-14 – 2012-03-16 (×3): 40 mg via INTRAVENOUS
  Filled 2012-03-14 (×3): qty 40

## 2012-03-14 MED ORDER — POTASSIUM CHLORIDE IN NACL 20-0.9 MEQ/L-% IV SOLN: INTRAVENOUS | Status: DC

## 2012-03-14 MED ORDER — CHLORHEXIDINE GLUCONATE 0.12 % MT SOLN
15.0000 mL | Freq: Two times a day (BID) | OROMUCOSAL | Status: DC
  Administered 2012-03-14 – 2012-03-16 (×5): 15 mL via OROMUCOSAL
  Filled 2012-03-14 (×5): qty 15

## 2012-03-14 MED ORDER — PANTOPRAZOLE SODIUM 40 MG IV SOLR
40.0000 mg | Freq: Once | INTRAVENOUS | Status: AC
  Administered 2012-03-14: 40 mg via INTRAVENOUS
  Filled 2012-03-14: qty 40

## 2012-03-14 MED ORDER — PROMETHAZINE HCL 25 MG PO TABS
25.0000 mg | ORAL_TABLET | Freq: Four times a day (QID) | ORAL | Status: DC | PRN
  Administered 2012-03-14 – 2012-03-15 (×4): 25 mg via ORAL
  Filled 2012-03-14 (×4): qty 1

## 2012-03-14 MED ORDER — IOHEXOL 300 MG/ML  SOLN
100.0000 mL | Freq: Once | INTRAMUSCULAR | Status: AC | PRN
  Administered 2012-03-14: 100 mL via INTRAVENOUS

## 2012-03-14 MED ORDER — POTASSIUM CHLORIDE IN NACL 20-0.9 MEQ/L-% IV SOLN
INTRAVENOUS | Status: DC
  Administered 2012-03-14 – 2012-03-15 (×2): via INTRAVENOUS
  Filled 2012-03-14 (×2): qty 1000

## 2012-03-14 MED ORDER — HYDROMORPHONE HCL PF 1 MG/ML IJ SOLN
1.0000 mg | INTRAMUSCULAR | Status: DC | PRN
  Administered 2012-03-14 – 2012-03-16 (×9): 1 mg via INTRAVENOUS
  Filled 2012-03-14 (×9): qty 1

## 2012-03-14 MED ORDER — DEXTROSE 5 % IV SOLN
1.0000 g | INTRAVENOUS | Status: DC
  Administered 2012-03-14: 1 g via INTRAVENOUS
  Filled 2012-03-14 (×2): qty 10

## 2012-03-14 MED ORDER — POLYETHYLENE GLYCOL 3350 17 G PO PACK
17.0000 g | PACK | Freq: Every day | ORAL | Status: DC
  Administered 2012-03-14 – 2012-03-16 (×3): 17 g via ORAL
  Filled 2012-03-14 (×3): qty 1

## 2012-03-14 NOTE — ED Notes (Signed)
First cup of CT contrast completed and CT notified. Pt reports some nausea after drinking first cup and requesting something for nausea. Dahlia Client, PA updated and verbal order rec'd for Zofran 4mg  IV x1 now

## 2012-03-14 NOTE — ED Provider Notes (Signed)
Seen by me complains of severe epigastric pain and multiple episodes of vomiting onset yesterday. Pain is worse with vomiting. She's vomited no blood. No fever no other complaint nothing makes symptoms better or worse. Patient has been treated with multiple doses of antiemetics, GI cocktail, without relief. at 2:15 AM she continues to vomit after sipping water. Suspect peptic ulcer disease versus GERD Spoke with Dr. Onalee Hua plan 23 hour observation medical surgical floor .diagnoses #1 intractable vomiting , #2 abdominal pain   Doug Sou, MD 03/14/12 [phone]

## 2012-03-14 NOTE — H&P (Signed)
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  She is not able to hold anything down.  Pain is worse with eating.  No fevers.  No diarrhea.  No sob or cp.  She actually has this same problem about once every 6 months or so for the past 2 years and is usually relieved with taking otc prilosec for a couple of days.  But this time, nothing was relieving the pain.  She still has her gallbladder.  Review of Systems:  Positive and negative as per HPI otherwise all other systems are negative  Past Medical History: Past Medical History  Diagnosis Date  . Chest pain   . Pulse irregularity   . Epilepsy   . Seasonal allergies   . Asthma   . Anemia    Past Surgical History  Procedure Laterality Date  . Dilation and curettage of uterus  2008  . Tubal ligation  2000  . Hand surgery  2001  . Hernia repair  age 47    Medications: Prior to Admission medications   Medication Sig Start Date End Date Taking? Authorizing Provider  meloxicam (MOBIC) 7.5 MG tablet Take 1 tablet (7.5 mg total) by mouth daily. 03/10/12  Yes Vida Roller, MD  Nutritional Supplements (JUICE PLUS FIBRE PO) Take 6 capsules by mouth daily.   Yes Historical Provider, MD  verapamil (CALAN) 120 MG tablet Take 120 mg by mouth daily as needed (for PVC's).   Yes Historical Provider, MD    Allergies:   Allergies  Allergen Reactions  . Sulfa Antibiotics Rash    Social History:  reports that she has quit smoking. She has never used smokeless tobacco. She reports that  drinks alcohol. She reports that she does not use illicit drugs.  Family History: Family History  Problem Relation Age of Onset  . Breast cancer Mother     Physical Exam: Filed Vitals:   03/13/12 2315 03/14/12 0000 03/14/12 0200 03/14/12 0217  BP: 107/71 99/55 107/63   Pulse: 68 71 71   Temp:    98.1 F (36.7 C)  TempSrc:    Oral   Resp: 14 16 18    SpO2: 99% 100% 100%    General appearance: alert, cooperative and no distress Eyes: negative Nose: Nares normal. Septum midline. Mucosa normal. No drainage or sinus tenderness. Lungs: clear to auscultation bilaterally Heart: regular rate and rhythm, S1, S2 normal, no murmur, click, rub or gallop Abdomen: soft, non-tender; bowel sounds normal; no masses,  no organomegaly Extremities: extremities normal, atraumatic, no cyanosis or edema Pulses: 2+ and symmetric Skin: Skin color, texture, turgor normal. No rashes or lesions Neurologic: Grossly normal   Labs on Admission:   Recent Labs  03/13/12 2138  NA 140  K 4.0  CL 103  CO2 26  GLUCOSE 81  BUN 14  CREATININE 0.69  CALCIUM 9.4    Recent Labs  03/13/12 2138  AST 17  ALT 16  ALKPHOS 45  BILITOT 0.4  PROT 7.8  ALBUMIN 4.4    Recent Labs  03/13/12 2138  LIPASE 32    Recent Labs  03/13/12 2138  WBC 7.8  NEUTROABS 3.9  HGB 12.0  HCT 35.1*  MCV 82.2  PLT 228   Radiological Exams on Admission:  Ct Abdomen Pelvis W Contrast  03/14/2012  *RADIOLOGY REPORT*  Clinical Data: Epigastric and right lower  quadrant pain, nausea, and vomiting since yesterday  CT ABDOMEN AND PELVIS WITH CONTRAST  Technique:  Multidetector CT imaging of the abdomen and pelvis was performed following the standard protocol during bolus administration of intravenous contrast. Sagittal and coronal MPR images reconstructed from axial data set.  Contrast: OMNIPAQUE IOHEXOL 300 MG/ML  SOLN Dilute oral contrast.  Comparison: None  Findings: Minimal atelectasis at lung bases. Probable 5 mm cyst right lobe liver image 25. Liver, spleen, pancreas, kidneys, and adrenal glands otherwise normal. Normal appendix. Probable small cyst left ovary 2.4 x 1.7 cm image 63. Bladder, ureters, uterus and adnexae otherwise unremarkable. Increased stool throughout colon. Stomach and bowel loops otherwise grossly normal appearance. No mass,  adenopathy, free fluid or inflammatory process. Potential small hemangioma within L4 vertebral body. No acute osseous lesions.  IMPRESSION: No definite acute intra-abdominal or intrapelvic abnormalities. Increased stool throughout colon. Probable small left ovarian cyst.   Original Report Authenticated By: Ulyses Southward, M.D.     Assessment/Plan 42 yo female with epi abd pain/n/v probably gastritis/pud vs biliary colic  Principal Problem:   Nausea and vomiting Active Problems:   PVC (premature ventricular contraction)   Abdominal pain, acute, epigastric   Dehydration  Place on iv protonix.  lfts and lipase nml.  Provide prn phenergan (zofran not helping much) and dilaudid.  Ck abd u/s.  May need HIDA scan to assess gallbladder function.  abd exam is benign.  ivf overnight.  If not relieved with ppi, and gallbladder w/u normal, may need consult with GI for possible EGD.  DAVID,RACHAL A 03/14/2012, 4:25 AM

## 2012-03-14 NOTE — ED Notes (Addendum)
Patient transported to CT Drinking second cup of contrast

## 2012-03-14 NOTE — Consult Note (Signed)
[facility] Consult: 3:44 PM 03/14/2012   Referring Provider: Dr Isidoro Donning  Primary Care Physician:  none Primary Gastroenterologist:  None, unassigned   Reason for Consultation:  Epigastric pain.   HPI: Jenny Hudson is a 42 y.o. female.  Hx PVCs for which she takes prn Verapamil.   Starting 3/6 had mid sternal SSCP and hypotension after taking a hot shower.  EMS boyfriend noted diminished radial pulse.  Came to ED and had cardiac focused exam, ruled out for MI.  Given Rx for Mobic.  Took one of these at home but did not really help.  Pain diminished, but did not resolve, and went to work on 3/7 and 3/8.  In AM of 3/9 awakened with intense burning pain in epigastrium.  Felt nauseated but was unable to vomit. Pain is worse with po intake and feels like it can not go down.  Has even been spitting up phlegm but no emesis.   GI ROS unremarkable: infrequent heartburn, no dysphagia, no anorexia. Normally has daily BMs, but with recent events has not had BM for 4 days.   Plain abd films show constipation. LFTs and CBC normal.  Pain improved with Dilaudid, not much relief with antacids.  Urine suggestive of UTI and started on Rocephin.  Has a few Clue cells on Wet Prep No new meds, no use of NSAIDs, just one dose of Mobic. ETOH of one beer or glass of wine every month or so.     Past Medical History  Diagnosis Date  . Chest pain   . Pulse irregularity   . Epilepsy   . Seasonal allergies   . Asthma   . Anemia     Past Surgical History  Procedure Laterality Date  . Dilation and curettage of uterus  2008  . Tubal ligation  2000  . Hand surgery  2001    repair of tendons, nerves after glass laceerated her hand.   Marland Kitchen Hernia repair  age 64    right inguinal.     Prior to Admission medications   Medication Sig Start Date End Date Taking? Authorizing Provider  meloxicam (MOBIC) 7.5 MG tablet Take 1 tablet (7.5 mg total) by mouth daily. 03/10/12   Took only one time.  Yes Vida Roller, MD  Nutritional Supplements (JUICE PLUS FIBRE PO) Take 6 capsules by mouth daily.   Yes Historical Provider, MD  verapamil (CALAN) 120 MG tablet Take 120 mg by mouth daily as needed (for PVC's).   Yes Historical Provider, MD    Scheduled Meds: . antiseptic oral rinse  15 mL Mouth Rinse q12n4p  . cefTRIAXone (ROCEPHIN)  IV  1 g Intravenous Q24H  . chlorhexidine  15 mL Mouth Rinse BID  . pantoprazole (PROTONIX) IV  40 mg Intravenous Q24H   Infusions: . 0.9 % NaCl with KCl 20 mEq / L 75 mL/hr at 03/14/12 0518   PRN Meds: alum & mag hydroxide-simeth, HYDROmorphone (DILAUDID) injection, promethazine   Allergies as of 03/13/2012 - Review Complete 03/13/2012  Allergen Reaction Noted  . Sulfa antibiotics Rash     Family History  Problem Relation Age of Onset  . Breast cancer Mother     History   Social History  . Marital Status: Divorced    Spouse Name: N/A    Number of Children: 4  . Years of Education: N/A   Occupational History  . 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      Social History  Main Topics  . Smoking status: Former Games developer  . Smokeless tobacco: Never Used     Comment: quit jan 2012  . Alcohol Use: Yes  . Drug Use: No     REVIEW OF SYSTEMS: Constitutional:  No weight fluctuation.  No fatigue or activity limits ENT:  No nose bleeds.  Very congested with seasonal allergies Pulm:  No cough or SOB CV:  Last Verapamil for PVCs was 3/8/ GU:  No dysuria, frequency or cloudy urine GI:  Per HPI Heme:  Anemia with pregnancies.  This was treated with po Iron .    Transfusions:  none Neuro:  No dizziness, some headache currently Derm:  No rash no sores.  No itching Endocrine:  No sweats, chills, excessive thirst. Immunization: flu shot current.  Travel:  None    PHYSICAL EXAM: Vital signs in last 24 hours: Temp:  [97.7 F (36.5 C)-98.4 F (36.9 C)] 97.7 F (36.5 C) (03/10 1028) Pulse  Rate:  [61-96] 66 (03/10 1028) Resp:  [13-24] 20 (03/10 1028) BP: (99-129)/(51-104) 105/69 mmHg (03/10 1028) SpO2:  [97 %-100 %] 100 % (03/10 1028) Weight:  [73.755 kg (162 lb 9.6 oz)] 73.755 kg (162 lb 9.6 oz) (03/10 0440)  General: looks uncomfortable and moderately unwell but not acutely ill.  Head:  No asymmetry or facial edema  Eyes:  No icterus or pallor Ears:  No HOH  Nose:  Congested but no drainage Mouth:  Clear, moist MM.  Good teeth Neck:  No JVD or mass.  No bruits Lungs:  Clear B Heart: RRR Abdomen:  Soft, NT, ND , no HSM , no bruits or masses.   Rectal: not done   Musc/Skeltl: no joint swelling Extremities:  No pedal edema  Neurologic:  Pleasant, no confusion or tremor.  Limb strength full x 4.  Skin:  No rash or sores Tattoos:  On left lateral lower leg Nodes:  No cervical adenopathy   Psych:  Seems depressed, flat affect.   LAB RESULTS:  Recent Labs  03/13/12 2138  WBC 7.8  HGB 12.0  HCT 35.1*  PLT 228   BMET Lab Results  Component Value Date   NA 140 03/13/2012   NA 139 03/10/2012   NA 136 12/09/2010   K 4.0 03/13/2012   K 4.2 03/10/2012   K 3.7 12/09/2010   CL 103 03/13/2012   CL 104 03/10/2012   CL 104 12/09/2010   CO2 26 03/13/2012   CO2 23 03/10/2012   CO2 22 12/09/2010   GLUCOSE 81 03/13/2012   GLUCOSE 90 03/10/2012   GLUCOSE 88 12/09/2010   BUN 14 03/13/2012   BUN 10 03/10/2012   BUN 16 12/09/2010   CREATININE 0.69 03/13/2012   CREATININE 0.65 03/10/2012   CREATININE 0.61 12/09/2010   CALCIUM 9.4 03/13/2012   CALCIUM 9.3 03/10/2012   CALCIUM 9.2 12/09/2010   LFT  Recent Labs  03/13/12 2138  PROT 7.8  ALBUMIN 4.4  AST 17  ALT 16  ALKPHOS 45  BILITOT 0.4      RADIOLOGY STUDIES: US Abdomen Complete 03/14/2012    Findings:  Gallbladder:  No gallstones, gallbladder wall thickening, or pericholecystic fluid. Negative sonographic Murphy's sign.  Common bile duct:  Normal.  3 mm in diameter.  Liver:  Normal.  IVC:  Normal.  Pancreas:  Normal.  Spleen:  Normal.   6.3 cm in length.  Right Kidney:  Normal.  10.7 cm in length.  Left Kidney:  Normal.  10.4 cm in length.  Abdominal aorta:  Normal.  2.2 cm maximum diameter.  IMPRESSION: Negative abdominal ultrasound.   Original Report Authenticated By: Francene Boyers, M.D.    Ct Abdomen Pelvis W Contrast 03/14/2012  Findings: Minimal atelectasis at lung bases. Probable 5 mm cyst right lobe liver image 25. Liver, spleen, pancreas, kidneys, and adrenal glands otherwise normal. Normal appendix. Probable small cyst left ovary 2.4 x 1.7 cm image 63. Bladder, ureters, uterus and adnexae otherwise unremarkable. Increased stool throughout colon. Stomach and bowel loops otherwise grossly normal appearance. No mass, adenopathy, free fluid or inflammatory process. Potential small hemangioma within L4 vertebral body. No acute osseous lesions.  IMPRESSION: No definite acute intra-abdominal or intrapelvic abnormalities. Increased stool throughout colon. Probable small left ovarian cyst.   Original Report Authenticated By: Ulyses Southward, M.D.     ENDOSCOPIC STUDIES: None ever  IMPRESSION: *  Epigastric pain.  Rule out reflux disease, rule out candida esophagitis.  *  Constipation.  This is not the norm.  *  ? UTI, the urine is contaminated, has multiple Squams, awaiting culture, stains. In meantime on Rocephin *  Clue cells on wet prep, should she start Metronidazole for BV?  PLAN: *  Set up for EGD for tomorrow. Pt aware.  *  Give Dulcolax for constipation.  *  Ok to eat tonight, clears.    LOS: 1 day   Jennye Moccasin  03/14/2012, 3:44 PM Pager: [phone]     [facility] Attending  I have also seen and assessed the patient and agree with the above note. Epigastric pain w/ nausea and vomiting requiring admission. Biliary evaluation and pancreatic evaluation negative. EGD tomorrow The risks and benefits as well as alternatives of endoscopic procedure(s) have been discussed and reviewed. All questions answered. The patient  agrees to proceed.

## 2012-03-14 NOTE — ED Notes (Signed)
Pelvic done

## 2012-03-14 NOTE — Progress Notes (Signed)
Patient ID: Jenny Hudson  female  ZOX:[id number]    DOB: Aug 28, 1970    DOA: 03/13/2012  PCP: No primary provider on file.  Assessment/Plan: Principal Problem:   Nausea and vomiting with epigastric pain, GERD - Currently on clear liquid diet, IV fluids, IV Protonix - GI consult obtained this morning, appreciate recommendations, EGD tomorrow   Active Problems:   PVC (premature ventricular contraction): Follows Dr. Clide Cliff - Per patient, Dr. Graciela Husbands had recommended verapamil PRN for palpitations    Dehydration: Continue IV fluid hydration    BV (bacterial vaginosis): - Patient does have clue cells on the wet prep, will place on Flagyl 500mg  BID x 7days.      GERD (gastroesophageal reflux disease) - On PPI  ?UTI: Possibly also contaminant, repeat UA and culture, for now I placed her on Rocephin which can be discontinued if repeat UA is negative   DVT Prophylaxis:  Code Status:Full code  Disposition:    Subjective: Complaining of heartburn, nausea, vomiting, today somewhat feels better on the clear liquid diet  Objective: Weight change:   Intake/Output Summary (Last 24 hours) at 03/14/12 1623 Last data filed at 03/14/12 1400  Gross per 24 hour  Intake   1300 ml  Output      0 ml  Net   1300 ml   Blood pressure 105/69, pulse 66, temperature 97.7 F (36.5 C), temperature source Oral, resp. rate 20, height 5\' 7"  (1.702 m), weight 73.755 kg (162 lb 9.6 oz), last menstrual period 02/29/2012, SpO2 100.00%.  Physical Exam: General: Alert and awake, oriented x3, not in any acute distress. HEENT: anicteric sclera, pupils reactive to light and accommodation, EOMI CVS: S1-S2 clear, no murmur rubs or gallops Chest: clear to auscultation bilaterally, no wheezing, rales or rhonchi Abdomen: soft nontender, nondistended, normal bowel sounds, no organomegaly Extremities: no cyanosis, clubbing or edema noted bilaterally Neuro: Cranial nerves II-XII intact, no focal neurological  deficits  Lab Results: Basic Metabolic Panel:  Recent Labs Lab 03/10/12 0947 03/13/12 2138  NA 139 140  K 4.2 4.0  CL 104 103  CO2 23 26  GLUCOSE 90 81  BUN 10 14  CREATININE 0.65 0.69  CALCIUM 9.3 9.4   Liver Function Tests:  Recent Labs Lab 03/10/12 1013 03/13/12 2138  AST 18 17  ALT 17 16  ALKPHOS 43 45  BILITOT 0.4 0.4  PROT 7.2 7.8  ALBUMIN 3.9 4.4    Recent Labs Lab 03/13/12 2138  LIPASE 32   No results found for this basename: AMMONIA,  in the last 168 hours CBC:  Recent Labs Lab 03/10/12 0947 03/13/12 2138  WBC 5.3 7.8  NEUTROABS  --  3.9  HGB 11.7* 12.0  HCT 34.8* 35.1*  MCV 81.5 82.2  PLT 228 228   Cardiac Enzymes: No results found for this basename: CKTOTAL, CKMB, CKMBINDEX, TROPONINI,  in the last 168 hours BNP: No components found with this basename: POCBNP,  CBG: No results found for this basename: GLUCAP,  in the last 168 hours   Micro Results: Recent Results (from the past 240 hour(s))  WET PREP, GENITAL     Status: Abnormal   Collection Time    03/14/12  1:38 AM      Result Value Range Status   Yeast Wet Prep HPF POC NONE SEEN  NONE SEEN Final   Trich, Wet Prep NONE SEEN  NONE SEEN Final   Clue Cells Wet Prep HPF POC FEW (*) NONE SEEN Final   WBC, Wet  Prep HPF POC FEW (*) NONE SEEN Final    Studies/Results: Dg Chest 2 View  03/10/2012  *RADIOLOGY REPORT*  Clinical Data: Chest pain shortness of breath.  Dizziness.  CHEST - 2 VIEW  Comparison: Chest x-ray with 12/09/2010.  Findings: Lung volumes are normal.  No consolidative airspace disease.  No pleural effusions.  No pneumothorax.  No pulmonary nodule or mass noted.  Pulmonary vasculature and the cardiomediastinal silhouette are within normal limits.  IMPRESSION: 1. No radiographic evidence of acute cardiopulmonary disease.   Original Report Authenticated By: Trudie Reed, M.D.    US Abdomen Complete  03/14/2012  *RADIOLOGY REPORT*  Clinical Data:  Abdominal pain.   COMPLETE ABDOMINAL ULTRASOUND  Comparison:  CT scan dated 03/14/2012  Findings:  Gallbladder:  No gallstones, gallbladder wall thickening, or pericholecystic fluid. Negative sonographic Murphy's sign.  Common bile duct:  Normal.  3 mm in diameter.  Liver:  Normal.  IVC:  Normal.  Pancreas:  Normal.  Spleen:  Normal.  6.3 cm in length.  Right Kidney:  Normal.  10.7 cm in length.  Left Kidney:  Normal.  10.4 cm in length.  Abdominal aorta:  Normal.  2.2 cm maximum diameter.  IMPRESSION: Negative abdominal ultrasound.   Original Report Authenticated By: Francene Boyers, M.D.    Ct Abdomen Pelvis W Contrast  03/14/2012  *RADIOLOGY REPORT*  Clinical Data: Epigastric and right lower quadrant pain, nausea, and vomiting since yesterday  CT ABDOMEN AND PELVIS WITH CONTRAST  Technique:  Multidetector CT imaging of the abdomen and pelvis was performed following the standard protocol during bolus administration of intravenous contrast. Sagittal and coronal MPR images reconstructed from axial data set.  Contrast: OMNIPAQUE IOHEXOL 300 MG/ML  SOLN Dilute oral contrast.  Comparison: None  Findings: Minimal atelectasis at lung bases. Probable 5 mm cyst right lobe liver image 25. Liver, spleen, pancreas, kidneys, and adrenal glands otherwise normal. Normal appendix. Probable small cyst left ovary 2.4 x 1.7 cm image 63. Bladder, ureters, uterus and adnexae otherwise unremarkable. Increased stool throughout colon. Stomach and bowel loops otherwise grossly normal appearance. No mass, adenopathy, free fluid or inflammatory process. Potential small hemangioma within L4 vertebral body. No acute osseous lesions.  IMPRESSION: No definite acute intra-abdominal or intrapelvic abnormalities. Increased stool throughout colon. Probable small left ovarian cyst.   Original Report Authenticated By: Ulyses Southward, M.D.     Medications: Scheduled Meds: . antiseptic oral rinse  15 mL Mouth Rinse q12n4p  . cefTRIAXone (ROCEPHIN)  IV  1 g  Intravenous Q24H  . chlorhexidine  15 mL Mouth Rinse BID  . pantoprazole (PROTONIX) IV  40 mg Intravenous Q24H      LOS: 1 day   RAI,RIPUDEEP M.D. [facility] 03/14/2012, 4:23 PM Pager: [phone]  If 7PM-7AM, please contact night-coverage [email or web address] Password [email or web address]

## 2012-03-14 NOTE — ED Notes (Signed)
Returned from to CT

## 2012-03-14 NOTE — ED Notes (Signed)
Did not tolerate pos. Nausea and increased pain. EDP updated.

## 2012-03-15 ENCOUNTER — Encounter (HOSPITAL_COMMUNITY): Payer: Self-pay

## 2012-03-15 ENCOUNTER — Encounter (HOSPITAL_COMMUNITY)
Admission: EM | Disposition: A | Discharge status: Home / Self Care | Payer: Self-pay | Source: Home / Self Care | Attending: Internal Medicine

## 2012-03-15 DIAGNOSIS — A499 Bacterial infection, unspecified: Secondary | ICD-10-CM

## 2012-03-15 DIAGNOSIS — R112 Nausea with vomiting, unspecified: Secondary | ICD-10-CM

## 2012-03-15 DIAGNOSIS — K2960 Other gastritis without bleeding: Secondary | ICD-10-CM | POA: Diagnosis present

## 2012-03-15 DIAGNOSIS — K219 Gastro-esophageal reflux disease without esophagitis: Secondary | ICD-10-CM

## 2012-03-15 HISTORY — PX: ESOPHAGOGASTRODUODENOSCOPY: SHX5428

## 2012-03-15 HISTORY — DX: Other gastritis without bleeding: K29.60

## 2012-03-15 LAB — GC/CHLAMYDIA PROBE AMP
CT Probe RNA: NEGATIVE
GC Probe RNA: NEGATIVE

## 2012-03-15 LAB — URINE CULTURE
Colony Count: 50000
Colony Count: NO GROWTH
Culture: NO GROWTH
Special Requests: NORMAL

## 2012-03-15 LAB — TSH: TSH: 3.221 u[IU]/mL (ref 0.350–4.500)

## 2012-03-15 SURGERY — EGD (ESOPHAGOGASTRODUODENOSCOPY)
Anesthesia: Moderate Sedation

## 2012-03-15 MED ORDER — SODIUM CHLORIDE 0.9 % IV SOLN
INTRAVENOUS | Status: DC
  Administered 2012-03-15 – 2012-03-16 (×2): via INTRAVENOUS

## 2012-03-15 MED ORDER — DIPHENHYDRAMINE HCL 50 MG/ML IJ SOLN
INTRAMUSCULAR | Status: AC
  Filled 2012-03-15: qty 1

## 2012-03-15 MED ORDER — MIDAZOLAM HCL 5 MG/ML IJ SOLN
INTRAMUSCULAR | Status: AC
  Filled 2012-03-15: qty 3

## 2012-03-15 MED ORDER — BUTAMBEN-TETRACAINE-BENZOCAINE 2-2-14 % EX AERO
INHALATION_SPRAY | CUTANEOUS | Status: DC | PRN
  Administered 2012-03-15: 2 via TOPICAL

## 2012-03-15 MED ORDER — SODIUM CHLORIDE 0.9 % IV SOLN
INTRAVENOUS | Status: DC
  Administered 2012-03-15: 500 mL via INTRAVENOUS

## 2012-03-15 MED ORDER — MIDAZOLAM HCL 10 MG/2ML IJ SOLN
INTRAMUSCULAR | Status: DC | PRN
  Administered 2012-03-15: 1 mg via INTRAVENOUS
  Administered 2012-03-15 (×2): 2 mg via INTRAVENOUS

## 2012-03-15 MED ORDER — BISACODYL 10 MG RE SUPP
10.0000 mg | Freq: Once | RECTAL | Status: AC
  Administered 2012-03-15: 10 mg via RECTAL
  Filled 2012-03-15: qty 1

## 2012-03-15 MED ORDER — DOCUSATE SODIUM 100 MG PO CAPS
100.0000 mg | ORAL_CAPSULE | Freq: Every day | ORAL | Status: DC
  Administered 2012-03-15 – 2012-03-16 (×2): 100 mg via ORAL
  Filled 2012-03-15 (×2): qty 1

## 2012-03-15 MED ORDER — DICYCLOMINE HCL 20 MG PO TABS
20.0000 mg | ORAL_TABLET | Freq: Three times a day (TID) | ORAL | Status: DC
  Administered 2012-03-15 – 2012-03-16 (×3): 20 mg via ORAL
  Filled 2012-03-15 (×5): qty 1

## 2012-03-15 MED ORDER — FENTANYL CITRATE 0.05 MG/ML IJ SOLN
INTRAMUSCULAR | Status: DC | PRN
  Administered 2012-03-15 (×2): 25 ug via INTRAVENOUS

## 2012-03-15 MED ORDER — FENTANYL CITRATE 0.05 MG/ML IJ SOLN
INTRAMUSCULAR | Status: AC
  Filled 2012-03-15: qty 4

## 2012-03-15 MED ORDER — POLYETHYLENE GLYCOL 3350 17 G PO PACK: 17.0000 g | PACK | Freq: Every day | ORAL | Status: DC

## 2012-03-15 NOTE — Progress Notes (Signed)
Patient ID: Jenny Hudson  female  [id number]    DOB: [birth year]    DOA: 03/13/2012  PCP: No primary provider on file.  Assessment/Plan: Principal Problem:   Nausea and vomiting with epigastric pain, GERD - Currently on IV fluids, IV Protonix, EGD today   Active Problems:   PVC (premature ventricular contraction): Follows Dr. Clide Cliff - Per patient, Dr. Graciela Husbands had recommended verapamil PRN for palpitations    Dehydration: Continue IV fluid hydration    BV (bacterial vaginosis): - Patient does have clue cells on the wet prep, placed on Flagyl 500mg  BID x 7days.      GERD (gastroesophageal reflux disease) - On PPI  ?UTI: Possibly also contaminant, repeat UA and nausea any UTI, DC'd Rocephin   DVT Prophylaxis:  Code Status:Full code  Disposition: We'll place her on liquid diet once she returns from EGD and advance as tolerated.  hopefully DC home in a.m. if tolerating solids    Subjective: N.p.o. this morning awaiting EGD at the time of my encounter. Complaining of mild epigastric pain had been taking NSAIDs at home for headache  Objective: Weight change:   Intake/Output Summary (Last 24 hours) at 03/15/12 1331 Last data filed at 03/15/12 0604  Gross per 24 hour  Intake   1720 ml  Output    450 ml  Net   1270 ml   Blood pressure 135/78, pulse 69, temperature 99 F (37.2 C), temperature source Oral, resp. rate 16, height 5\' 7"  (1.702 m), weight 73.755 kg (162 lb 9.6 oz), last menstrual period 02/29/2012, SpO2 97.00%.  Physical Exam: General: Alert and awake, oriented x3, not in any acute distress. CVS: S1-S2 clear, no murmur rubs or gallops Chest: clear to auscultation bilaterally, no wheezing, rales or rhonchi Abdomen: soft NT, ND, NBS Extremities: no cyanosis, clubbing or edema noted bilaterally   Lab Results: Basic Metabolic Panel:  Recent Labs Lab 03/10/12 0947 03/13/12 2138  NA 139 140  K 4.2 4.0  CL 104 103  CO2 23 26  GLUCOSE 90 81  BUN 10 14   CREATININE 0.65 0.69  CALCIUM 9.3 9.4   Liver Function Tests:  Recent Labs Lab 03/10/12 1013 03/13/12 2138  AST 18 17  ALT 17 16  ALKPHOS 43 45  BILITOT 0.4 0.4  PROT 7.2 7.8  ALBUMIN 3.9 4.4    Recent Labs Lab 03/13/12 2138  LIPASE 32   No results found for this basename: AMMONIA,  in the last 168 hours CBC:  Recent Labs Lab 03/10/12 0947 03/13/12 2138  WBC 5.3 7.8  NEUTROABS  --  3.9  HGB 11.7* 12.0  HCT 34.8* 35.1*  MCV 81.5 82.2  PLT 228 228   Cardiac Enzymes: No results found for this basename: CKTOTAL, CKMB, CKMBINDEX, TROPONINI,  in the last 168 hours BNP: No components found with this basename: POCBNP,  CBG: No results found for this basename: GLUCAP,  in the last 168 hours   Micro Results: Recent Results (from the past 240 hour(s))  URINE CULTURE     Status: None   Collection Time    03/13/12 11:05 PM      Result Value Range Status   Specimen Description URINE, RANDOM   Final   Special Requests ADDED 2333   Final   Culture  Setup Time 03/14/2012 04:10   Final   Colony Count 50,000 COLONIES/ML   Final   Culture     Final   Value: Multiple bacterial morphotypes present, none predominant. Suggest appropriate  recollection if clinically indicated.   Report Status 03/15/2012 FINAL   Final  GC/CHLAMYDIA PROBE AMP     Status: None   Collection Time    03/14/12  1:38 AM      Result Value Range Status   CT Probe RNA NEGATIVE  NEGATIVE Final   GC Probe RNA NEGATIVE  NEGATIVE Final   Comment: (NOTE)                                                                                                   Assay performed using the Gen-Probe APTIMA COMBO2 (R) Assay.     Acceptable specimen types for this assay include APTIMA Swabs (Unisex,     endocervical, urethral, or vaginal), first void urine, and ThinPrep     liquid based cytology samples.  WET PREP, GENITAL     Status: Abnormal   Collection Time    03/14/12  1:38 AM      Result Value Range Status    Yeast Wet Prep HPF POC NONE SEEN  NONE SEEN Final   Trich, Wet Prep NONE SEEN  NONE SEEN Final   Clue Cells Wet Prep HPF POC FEW (*) NONE SEEN Final   WBC, Wet Prep HPF POC FEW (*) NONE SEEN Final    Studies/Results: Dg Chest 2 View  03/10/2012  *RADIOLOGY REPORT*  Clinical Data: Chest pain shortness of breath.  Dizziness.  CHEST - 2 VIEW  Comparison: Chest x-ray with 12/09/2010.  Findings: Lung volumes are normal.  No consolidative airspace disease.  No pleural effusions.  No pneumothorax.  No pulmonary nodule or mass noted.  Pulmonary vasculature and the cardiomediastinal silhouette are within normal limits.  IMPRESSION: 1. No radiographic evidence of acute cardiopulmonary disease.   Original Report Authenticated By: Trudie Reed, M.D.    US Abdomen Complete  03/14/2012  *RADIOLOGY REPORT*  Clinical Data:  Abdominal pain.  COMPLETE ABDOMINAL ULTRASOUND  Comparison:  CT scan dated 03/14/2012  Findings:  Gallbladder:  No gallstones, gallbladder wall thickening, or pericholecystic fluid. Negative sonographic Murphy's sign.  Common bile duct:  Normal.  3 mm in diameter.  Liver:  Normal.  IVC:  Normal.  Pancreas:  Normal.  Spleen:  Normal.  6.3 cm in length.  Right Kidney:  Normal.  10.7 cm in length.  Left Kidney:  Normal.  10.4 cm in length.  Abdominal aorta:  Normal.  2.2 cm maximum diameter.  IMPRESSION: Negative abdominal ultrasound.   Original Report Authenticated By: Francene Boyers, M.D.    Ct Abdomen Pelvis W Contrast  03/14/2012  *RADIOLOGY REPORT*  Clinical Data: Epigastric and right lower quadrant pain, nausea, and vomiting since yesterday  CT ABDOMEN AND PELVIS WITH CONTRAST  Technique:  Multidetector CT imaging of the abdomen and pelvis was performed following the standard protocol during bolus administration of intravenous contrast. Sagittal and coronal MPR images reconstructed from axial data set.  Contrast: OMNIPAQUE IOHEXOL 300 MG/ML  SOLN Dilute oral contrast.  Comparison: None   Findings: Minimal atelectasis at lung bases. Probable 5 mm cyst right lobe liver image 25. Liver, spleen, pancreas, kidneys,  and adrenal glands otherwise normal. Normal appendix. Probable small cyst left ovary 2.4 x 1.7 cm image 63. Bladder, ureters, uterus and adnexae otherwise unremarkable. Increased stool throughout colon. Stomach and bowel loops otherwise grossly normal appearance. No mass, adenopathy, free fluid or inflammatory process. Potential small hemangioma within L4 vertebral body. No acute osseous lesions.  IMPRESSION: No definite acute intra-abdominal or intrapelvic abnormalities. Increased stool throughout colon. Probable small left ovarian cyst.   Original Report Authenticated By: Ulyses Southward, M.D.     Medications: Scheduled Meds: . [facility] HOLD] antiseptic oral rinse  15 mL Mouth Rinse q12n4p  . [facility] HOLD] bisacodyl  10 mg Rectal Once  . [facility] HOLD] chlorhexidine  15 mL Mouth Rinse BID  . [facility] HOLD] docusate sodium  100 mg Oral Daily  . [facility] HOLD] metronidazole  500 mg Intravenous Q12H  . [[facility] HOLD] pantoprazole (PROTONIX) IV  40 mg Intravenous Q24H  . [[facility] HOLD] polyethylene glycol  17 g Oral Daily      LOS: 2 days   RAI,RIPUDEEP M.D. [facility] 03/15/2012, 1:31 PM Pager: [phone]  If 7PM-7AM, please contact night-coverage [email or web address] Password [email or web address]

## 2012-03-15 NOTE — Op Note (Signed)
[facility] [address] [place], [address]   ENDOSCOPY PROCEDURE REPORT  PATIENT: Jenny Hudson, Jenny Hudson  MR#: [id number] BIRTHDATE: 1970-04-25 , 41  yrs. old GENDER: Female ENDOSCOPIST: Iva Boop, MD, [facility] REFERRED BY:  [facility] PROCEDURE DATE:  03/15/2012 PROCEDURE:  EGD w/ biopsy ASA CLASS:     Class II INDICATIONS:  Epigastric pain.   Vomiting. MEDICATIONS: Fentanyl 50 mcg IV and Versed-Detailed 5 mg IV TOPICAL ANESTHETIC: Cetacaine Spray  DESCRIPTION OF PROCEDURE: After the risks benefits and alternatives of the procedure were thoroughly explained, informed consent was obtained.  The Pentax Gastroscope [id number] endoscope was introduced through the mouth and advanced to the second portion of the duodenum. Without limitations.  The instrument was slowly withdrawn as the mucosa was fully examined.        STOMACH: Moderate erosive gastritis (inflammation) was found in the gastric antrum.  Multiple biopsies were performed using cold forceps.  Sample sent for histology.  The remainder of the upper endoscopy exam was otherwise normal. Retroflexed views revealed no abnormalities.     The scope was then withdrawn from the patient and the procedure completed.  COMPLICATIONS: There were no complications. ENDOSCOPIC IMPRESSION: 1.   Erosive gastritis (inflammation) was found in the gastric antrum; multiple biopsies - ? from meloxicam 2.   The remainder of the upper endoscopy exam was otherwise normal  RECOMMENDATIONS: Continue PPI, add dicyclomine for pain Avoid NSAID I will let her know re: bxs   eSigned:  Iva Boop, MD, [facility] 03/15/2012 1:24 PM CC:The Patient

## 2012-03-16 ENCOUNTER — Encounter (HOSPITAL_COMMUNITY): Payer: Self-pay | Admitting: Internal Medicine

## 2012-03-16 MED ORDER — METHOCARBAMOL 500 MG PO TABS
500.0000 mg | ORAL_TABLET | Freq: Once | ORAL | Status: AC
  Administered 2012-03-16: 500 mg via ORAL
  Filled 2012-03-16: qty 1

## 2012-03-16 MED ORDER — DICYCLOMINE HCL 20 MG PO TABS: 20.0000 mg | ORAL_TABLET | Freq: Three times a day (TID) | ORAL | Status: DC

## 2012-03-16 MED ORDER — METRONIDAZOLE 500 MG PO TABS: 500.0000 mg | ORAL_TABLET | Freq: Two times a day (BID) | ORAL | Status: DC

## 2012-03-16 MED ORDER — PROMETHAZINE HCL 25 MG PO TABS: 25.0000 mg | ORAL_TABLET | Freq: Four times a day (QID) | ORAL | Status: DC | PRN

## 2012-03-16 MED ORDER — POLYETHYLENE GLYCOL 3350 17 G PO PACK: 17.0000 g | PACK | ORAL | Status: DC | PRN

## 2012-03-16 MED ORDER — PANTOPRAZOLE SODIUM 40 MG PO TBEC: 40.0000 mg | DELAYED_RELEASE_TABLET | Freq: Every day | ORAL | Status: DC

## 2012-03-16 MED ORDER — METHOCARBAMOL 500 MG PO TABS: 500.0000 mg | ORAL_TABLET | Freq: Three times a day (TID) | ORAL | Status: DC | PRN

## 2012-03-16 NOTE — Discharge Summary (Signed)
Physician Discharge Summary  Patient ID: Jenny Hudson MRN: [id number] DOB/AGE: 09/01/1970 42 y.o.  Admit date: 03/13/2012 Discharge date: 03/16/2012  Primary Care Physician:  No primary provider on file, patient wants to follow-up with Dr Valentina Lucks.  Discharge Diagnoses:    . Abdominal pain, acute, epigastric . Nausea and vomiting . Dehydration . PVC (premature ventricular contraction) . BV (bacterial vaginosis) . GERD (gastroesophageal reflux disease) . Erosive gastritis  Consults:  Gastroenterology, Dr. Leone Payor   Discharge Medications:   Medication List    STOP taking these medications       meloxicam 7.5 MG tablet  Commonly known as:  MOBIC      TAKE these medications       dicyclomine 20 MG tablet  Commonly known as:  BENTYL  Take 1 tablet (20 mg total) by mouth 3 (three) times daily before meals.     JUICE PLUS FIBRE PO  Take 6 capsules by mouth daily.     metroNIDAZOLE 500 MG tablet  Commonly known as:  FLAGYL  Take 1 tablet (500 mg total) by mouth 2 (two) times daily. X 5 more days     pantoprazole 40 MG tablet  Commonly known as:  PROTONIX  Take 1 tablet (40 mg total) by mouth daily.     polyethylene glycol packet  Commonly known as:  MIRALAX / GLYCOLAX  Take 17 g by mouth as needed (constipation).     promethazine 25 MG tablet  Commonly known as:  PHENERGAN  Take 1 tablet (25 mg total) by mouth every 6 (six) hours as needed for nausea.     verapamil 120 MG tablet  Commonly known as:  CALAN  Take 120 mg by mouth daily as needed (for PVC's).         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  She is not able to hold anything down. Pain is worse with eating. No fevers. No diarrhea. No sob or cp. She actually has this same problem about once every 6 months or so for the  past 2 years and is usually relieved with taking otc prilosec for a couple of days. But this time, nothing was relieving the pain.   Hospital Course:  Nausea and vomiting with epigastric pain: Secondary to acute erosive gastritis. Patient was placed on clear liquid diet, IV fluids and IV Protonix. Gastroenterology was consulted and patient underwent endoscopy which showed erosive gastritis and gastric antrum. Possibly due to NSAIDs and meloxicam. Patient is currently tolerating soft solids without any difficulty. Patient was advised to take dicyclomine for pain and proton pump inhibitor.   PVC (premature ventricular contraction): Follows Dr. Graciela Husbands outpatient, had recommended verapamil PRN for palpitations   Dehydration: resolved with IV fluid hydration   BV (bacterial vaginosis):  Patient does have clue cells on the wet prep, placed on Flagyl 500mg  BID x 7days.   GERD (gastroesophageal reflux disease)  - On PPI  PROCEDURE: ENDOSCOPIC IMPRESSION:  1. Erosive gastritis (inflammation) was found in the gastric  antrum; multiple biopsies - ? from meloxicam  2. The remainder of the upper endoscopy exam was otherwise normal  RECOMMENDATIONS:  Continue PPI, add dicyclomine for pain  Avoid NSAID  Day of Discharge BP 104/58  Pulse 66  Temp(Src) 98 F (36.7 C) (Oral)  Resp 16  Ht 5\' 7"  (1.702 m)  Wt 73.755 kg (162 lb 9.6 oz)  BMI 25.46 kg/m2  SpO2 96%  LMP 02/29/2012  Physical Exam: General: Alert and awake oriented x3 not in any acute distress. HEENT: anicteric sclera, pupils reactive to light and accommodation CVS: S1-S2 clear no murmur rubs or gallops Chest: clear to auscultation bilaterally, no wheezing rales or rhonchi Abdomen: soft nontender, nondistended, normal bowel sounds, no organomegaly Extremities: no cyanosis, clubbing or edema noted bilaterally Neuro: Cranial nerves II-XII intact, no focal neurological deficits   The results of significant diagnostics from this  hospitalization (including imaging, microbiology, ancillary and laboratory) are listed below for reference.    LAB RESULTS: Basic Metabolic Panel:  Recent Labs Lab 03/10/12 0947 03/13/12 2138  NA 139 140  K 4.2 4.0  CL 104 103  CO2 23 26  GLUCOSE 90 81  BUN 10 14  CREATININE 0.65 0.69  CALCIUM 9.3 9.4   Liver Function Tests:  Recent Labs Lab 03/10/12 1013 03/13/12 2138  AST 18 17  ALT 17 16  ALKPHOS 43 45  BILITOT 0.4 0.4  PROT 7.2 7.8  ALBUMIN 3.9 4.4    Recent Labs Lab 03/13/12 2138  LIPASE 32   No results found for this basename: AMMONIA,  in the last 168 hours CBC:  Recent Labs Lab 03/10/12 0947 03/13/12 2138  WBC 5.3 7.8  NEUTROABS  --  3.9  HGB 11.7* 12.0  HCT 34.8* 35.1*  MCV 81.5 82.2  PLT 228 228    Significant Diagnostic Studies:  US Abdomen Complete  03/14/2012  *RADIOLOGY REPORT*  Clinical Data:  Abdominal pain.  COMPLETE ABDOMINAL ULTRASOUND  Comparison:  CT scan dated 03/14/2012  Findings:  Gallbladder:  No gallstones, gallbladder wall thickening, or pericholecystic fluid. Negative sonographic Murphy's sign.  Common bile duct:  Normal.  3 mm in diameter.  Liver:  Normal.  IVC:  Normal.  Pancreas:  Normal.  Spleen:  Normal.  6.3 cm in length.  Right Kidney:  Normal.  10.7 cm in length.  Left Kidney:  Normal.  10.4 cm in length.  Abdominal aorta:  Normal.  2.2 cm maximum diameter.  IMPRESSION: Negative abdominal ultrasound.   Original Report Authenticated By: Francene Boyers, M.D.    Ct Abdomen Pelvis W Contrast  03/14/2012  *RADIOLOGY REPORT*  Clinical Data: Epigastric and right lower quadrant pain, nausea, and vomiting since yesterday  CT ABDOMEN AND PELVIS WITH CONTRAST  Technique:  Multidetector CT imaging of the abdomen and pelvis was performed following the standard protocol during bolus administration of intravenous contrast. Sagittal and coronal MPR images reconstructed from axial data set.  Contrast: OMNIPAQUE IOHEXOL 300 MG/ML  SOLN  Dilute oral contrast.  Comparison: None  Findings: Minimal atelectasis at lung bases. Probable 5 mm cyst right lobe liver image 25. Liver, spleen, pancreas, kidneys, and adrenal glands otherwise normal. Normal appendix. Probable small cyst left ovary 2.4 x 1.7 cm image 63. Bladder, ureters, uterus and adnexae otherwise unremarkable. Increased stool throughout colon. Stomach and bowel loops otherwise grossly normal appearance. No mass, adenopathy, free fluid or inflammatory process. Potential small hemangioma within L4 vertebral body. No acute osseous lesions.  IMPRESSION: No definite acute intra-abdominal or intrapelvic abnormalities. Increased stool throughout colon. Probable small left ovarian cyst.   Original Report Authenticated By: Ulyses Southward, M.D.    Disposition and Follow-up:     Discharge Orders   Future Orders Complete By Expires     Diet - low sodium heart healthy  As  directed     Discharge instructions  As directed     Comments:      Discharge diet: soft diet. Avoid spicy foods, NSAIDS    Increase activity slowly  As directed         DISPOSITION: home  DIET: soft solids  ACTIVITY: as tolerated  TESTS THAT NEED FOLLOW-UP Biopsy done during EGD- Dr Leone Payor to follow-up  DISCHARGE FOLLOW-UP Follow-up Information   Follow up with Stan Head, MD. Schedule an appointment as soon as possible for a visit in 2 weeks. (in 2-3 weeks as needed)    Contact information:   [address] [place] [address] [phone]       Follow up with Lillia Mountain, MD. Schedule an appointment as soon as possible for a visit in 2 weeks. (for follow-up)    Contact information:   [address], [address] [place] [place] [address] [phone]       Time spent on Discharge: 33 mins  Signed:   RAI,RIPUDEEP M.D. [facility] 03/16/2012, 10:58 AM Pager: [phone]

## 2012-03-16 NOTE — Progress Notes (Signed)
Quick Note:  Let her know biopsies show inflammation - gastritis - no H. Pylori She should take PPI x 2 months and then stop Follow-up GI prn ______

## 2012-03-16 NOTE — Progress Notes (Signed)
Pt discharged to home

## 2012-03-17 NOTE — ED Provider Notes (Signed)
Medical screening examination/treatment/procedure(s) were conducted as a shared visit with non-physician practitioner(s) and myself.  I personally evaluated the patient during the encounter  Doug Sou, MD 03/17/12 [phone]

## 2012-11-16 ENCOUNTER — Encounter (HOSPITAL_COMMUNITY): Payer: Self-pay | Admitting: Emergency Medicine

## 2012-11-16 ENCOUNTER — Inpatient Hospital Stay (HOSPITAL_COMMUNITY)
Admission: EM | Admit: 2012-11-16 | Discharge: 2012-11-18 | DRG: 392 | Disposition: A | Discharge status: Home / Self Care | Payer: 59 | Attending: Internal Medicine | Admitting: Internal Medicine

## 2012-11-16 DIAGNOSIS — R112 Nausea with vomiting, unspecified: Secondary | ICD-10-CM

## 2012-11-16 DIAGNOSIS — R8271 Bacteriuria: Secondary | ICD-10-CM | POA: Diagnosis present

## 2012-11-16 DIAGNOSIS — K2960 Other gastritis without bleeding: Principal | ICD-10-CM | POA: Diagnosis present

## 2012-11-16 DIAGNOSIS — K219 Gastro-esophageal reflux disease without esophagitis: Secondary | ICD-10-CM

## 2012-11-16 DIAGNOSIS — R1013 Epigastric pain: Secondary | ICD-10-CM | POA: Diagnosis present

## 2012-11-16 DIAGNOSIS — R82998 Other abnormal findings in urine: Secondary | ICD-10-CM | POA: Diagnosis present

## 2012-11-16 DIAGNOSIS — R1115 Cyclical vomiting syndrome unrelated to migraine: Secondary | ICD-10-CM | POA: Diagnosis present

## 2012-11-16 DIAGNOSIS — N390 Urinary tract infection, site not specified: Secondary | ICD-10-CM

## 2012-11-16 DIAGNOSIS — Z87891 Personal history of nicotine dependence: Secondary | ICD-10-CM

## 2012-11-16 DIAGNOSIS — R1110 Vomiting, unspecified: Secondary | ICD-10-CM | POA: Diagnosis present

## 2012-11-16 LAB — CBC WITH DIFFERENTIAL/PLATELET
Basophils Absolute: 0 10*3/uL (ref 0.0–0.1)
Basophils Relative: 1 % (ref 0–1)
Eosinophils Absolute: 0.4 10*3/uL (ref 0.0–0.7)
Eosinophils Relative: 5 % (ref 0–5)
HCT: 34.9 % — ABNORMAL LOW (ref 36.0–46.0)
Hemoglobin: 11.9 g/dL — ABNORMAL LOW (ref 12.0–15.0)
Lymphocytes Relative: 25 % (ref 12–46)
Lymphs Abs: 2.1 10*3/uL (ref 0.7–4.0)
MCH: 26.7 pg (ref 26.0–34.0)
MCHC: 34.1 g/dL (ref 30.0–36.0)
MCV: 78.4 fL (ref 78.0–100.0)
Monocytes Absolute: 0.6 10*3/uL (ref 0.1–1.0)
Monocytes Relative: 7 % (ref 3–12)
Neutro Abs: 5.1 10*3/uL (ref 1.7–7.7)
Neutrophils Relative %: 62 % (ref 43–77)
Platelets: 336 10*3/uL (ref 150–400)
RBC: 4.45 MIL/uL (ref 3.87–5.11)
RDW: 14.7 % (ref 11.5–15.5)
WBC: 8.3 10*3/uL (ref 4.0–10.5)

## 2012-11-16 LAB — URINE MICROSCOPIC-ADD ON

## 2012-11-16 LAB — COMPREHENSIVE METABOLIC PANEL
ALT: 17 U/L (ref 0–35)
AST: 21 U/L (ref 0–37)
Albumin: 4.2 g/dL (ref 3.5–5.2)
Alkaline Phosphatase: 58 U/L (ref 39–117)
BUN: 9 mg/dL (ref 6–23)
CO2: 23 mEq/L (ref 19–32)
Calcium: 9.6 mg/dL (ref 8.4–10.5)
Chloride: 100 mEq/L (ref 96–112)
Creatinine, Ser: 0.75 mg/dL (ref 0.50–1.10)
GFR calc Af Amer: >90 mL/min (ref >90)
GFR calc non Af Amer: >90 mL/min (ref >90)
Glucose, Bld: 88 mg/dL (ref 70–99)
Potassium: 3.9 mEq/L (ref 3.5–5.1)
Sodium: 137 mEq/L (ref 135–145)
Total Bilirubin: 0.4 mg/dL (ref 0.3–1.2)
Total Protein: 7.9 g/dL (ref 6.0–8.3)

## 2012-11-16 LAB — URINALYSIS, ROUTINE W REFLEX MICROSCOPIC
Bilirubin Urine: NEGATIVE
Glucose, UA: NEGATIVE mg/dL
Hgb urine dipstick: NEGATIVE
Ketones, ur: NEGATIVE mg/dL
Nitrite: POSITIVE — AB
Protein, ur: NEGATIVE mg/dL
Specific Gravity, Urine: 1.012 (ref 1.005–1.030)
Urobilinogen, UA: 0.2 mg/dL (ref 0.0–1.0)
pH: 6 (ref 5.0–8.0)

## 2012-11-16 LAB — LIPASE, BLOOD: Lipase: 26 U/L (ref 11–59)

## 2012-11-16 LAB — TROPONIN I: Troponin I: <0.3 ng/mL (ref <0.30)

## 2012-11-16 LAB — POCT PREGNANCY, URINE: Preg Test, Ur: NEGATIVE

## 2012-11-16 MED ORDER — ONDANSETRON HCL 4 MG/2ML IJ SOLN
4.0000 mg | Freq: Once | INTRAMUSCULAR | Status: AC
  Administered 2012-11-16: 4 mg via INTRAVENOUS
  Filled 2012-11-16: qty 2

## 2012-11-16 MED ORDER — ONDANSETRON HCL 4 MG/2ML IJ SOLN: 4.0000 mg | Freq: Four times a day (QID) | INTRAMUSCULAR | Status: DC | PRN

## 2012-11-16 MED ORDER — PROMETHAZINE HCL 25 MG PO TABS
12.5000 mg | ORAL_TABLET | Freq: Four times a day (QID) | ORAL | Status: DC | PRN
  Administered 2012-11-17: 12.5 mg via ORAL
  Filled 2012-11-16: qty 1

## 2012-11-16 MED ORDER — INFLUENZA VAC SPLIT QUAD 0.5 ML IM SUSP
0.5000 mL | INTRAMUSCULAR | Status: AC
  Administered 2012-11-17: 0.5 mL via INTRAMUSCULAR
  Filled 2012-11-16: qty 0.5

## 2012-11-16 MED ORDER — CEFTRIAXONE SODIUM 1 G IJ SOLR
1.0000 g | Freq: Once | INTRAMUSCULAR | Status: AC
  Administered 2012-11-16: 1 g via INTRAVENOUS
  Filled 2012-11-16: qty 10

## 2012-11-16 MED ORDER — PANTOPRAZOLE SODIUM 40 MG IV SOLR
40.0000 mg | Freq: Once | INTRAVENOUS | Status: AC
  Administered 2012-11-16: 40 mg via INTRAVENOUS
  Filled 2012-11-16: qty 40

## 2012-11-16 MED ORDER — MORPHINE SULFATE 4 MG/ML IJ SOLN
4.0000 mg | Freq: Once | INTRAMUSCULAR | Status: AC
  Administered 2012-11-16: 4 mg via INTRAVENOUS
  Filled 2012-11-16: qty 1

## 2012-11-16 MED ORDER — PROMETHAZINE HCL 25 MG/ML IJ SOLN
12.5000 mg | Freq: Four times a day (QID) | INTRAMUSCULAR | Status: DC | PRN
  Administered 2012-11-17: 12.5 mg via INTRAVENOUS
  Filled 2012-11-16: qty 1

## 2012-11-16 MED ORDER — ENOXAPARIN SODIUM 40 MG/0.4ML ~~LOC~~ SOLN
40.0000 mg | SUBCUTANEOUS | Status: DC
  Administered 2012-11-16 – 2012-11-17 (×2): 40 mg via SUBCUTANEOUS
  Filled 2012-11-16 (×3): qty 0.4

## 2012-11-16 MED ORDER — PANTOPRAZOLE SODIUM 40 MG IV SOLR
40.0000 mg | Freq: Two times a day (BID) | INTRAVENOUS | Status: DC
  Administered 2012-11-17 – 2012-11-18 (×3): 40 mg via INTRAVENOUS
  Filled 2012-11-16 (×5): qty 40

## 2012-11-16 MED ORDER — DICYCLOMINE HCL 20 MG PO TABS
20.0000 mg | ORAL_TABLET | Freq: Three times a day (TID) | ORAL | Status: DC
  Administered 2012-11-17 – 2012-11-18 (×5): 20 mg via ORAL
  Filled 2012-11-16 (×7): qty 1

## 2012-11-16 MED ORDER — SODIUM CHLORIDE 0.9 % IV SOLN
INTRAVENOUS | Status: DC
  Administered 2012-11-16 – 2012-11-18 (×3): via INTRAVENOUS

## 2012-11-16 MED ORDER — PROMETHAZINE HCL 25 MG/ML IJ SOLN
12.5000 mg | Freq: Once | INTRAMUSCULAR | Status: AC
  Administered 2012-11-16: 12.5 mg via INTRAVENOUS
  Filled 2012-11-16: qty 1

## 2012-11-16 MED ORDER — SODIUM CHLORIDE 0.9 % IV SOLN
8.0000 mg/h | INTRAVENOUS | Status: DC
  Administered 2012-11-16: 8 mg/h via INTRAVENOUS
  Filled 2012-11-16 (×3): qty 80

## 2012-11-16 MED ORDER — MORPHINE SULFATE 2 MG/ML IJ SOLN
2.0000 mg | INTRAMUSCULAR | Status: DC | PRN
  Administered 2012-11-16 – 2012-11-18 (×7): 2 mg via INTRAVENOUS
  Filled 2012-11-16 (×7): qty 1

## 2012-11-16 MED ORDER — ONDANSETRON HCL 4 MG PO TABS: 4.0000 mg | ORAL_TABLET | Freq: Four times a day (QID) | ORAL | Status: DC | PRN

## 2012-11-16 MED ORDER — SODIUM CHLORIDE 0.9 % IV BOLUS (SEPSIS)
1000.0000 mL | Freq: Once | INTRAVENOUS | Status: AC
  Administered 2012-11-16: 1000 mL via INTRAVENOUS

## 2012-11-16 NOTE — ED Notes (Signed)
EKG presented to Dr. Romeo Apple, EDP at 2024.

## 2012-11-16 NOTE — ED Notes (Signed)
Pt states she started having upper abd pain last night, took "alkaseltzer heartburn gas relief chews," and was relieved enough to go to sleep. Woke up this morning "ate toast and drank coffee" and pain started back, took prescribed protonix and dycyclamine with no relief. Tried to drink water, vomited twice. Pt states she was here for the same thing in March, was admitted and found that she had a "severe eroded gastritis of the Antrum"

## 2012-11-16 NOTE — H&P (Signed)
Date: 11/16/2012               Patient Name:  Jenny Hudson MRN: [id number]  DOB: 1970-04-16 Age / Sex: 42 y.o., female   PCP: No primary provider on file.         Medical Service: Internal Medicine Teaching Service         Attending Physician: Dr. Inez Catalina, MD    First Contact: Dr. Yetta Barre Pager: [phone]  Second Contact: Dr. Shirlee Latch Pager: [phone]       After Hours (After 5p/  First Contact Pager: [phone]  weekends / holidays): Second Contact Pager: [phone]   Chief Complaint: N/V/Epigastric Pain  History of Present Illness: Jenny Hudson is a 42 year old female with a PMH of Erosive gastritis, epilepsy, seasonal allergies with asthma, occasional palpitations.  Patient reports 4 days PTA she went to a BBQ where she consumed bufflo wings, sangria, and other spicey foods.  Since that time she has developed worsening epigastric abdominal pain.  On the morning of Nov 12th, she reported this pain became severe, did not respond to her daily regimen of Protonix and Bentyl, or to treatment with alka-seltzer, almond milk, peppermint gel caps.  She reports 4 episodes of NBNB emesis prior to arrival in the ED.  She reports she has not been able to keep any food or liquid down all day. She was previous admitted and treated for similar compliants in March of this year.  At that time an EGD was completed which showed Erosive gastritis, biopsy for H.Pylori was negative.  This was thought likely due to recent NSAIDs and Mobic use.  She was started on Protonix 40mg  daily, Bentyl 20mg  TID, and instructed to avoid spicy foods and alcohol.  She reports she has mostly made diet changes since that time but cannot always follow a strict diet.  She does take her medications as prescribed, and was well controlled up until this point.  She admits little alcohol intake other than at the [facility].  She is a former smoker (quit 2 years ago).  And avoids NSAIDs.  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  She reports she does not like being ideal at home.  She does not have a PCP and does not seek medical care frequently.  She had a negative cardiac stress test in Aug 2012. Patient reports a history of Epilepsy, she reports as a child she had 2 febrile and 2 nonfebrile seizures she was started on anti seizure medications, these were later discontinued and then restarted after a seizure after her first daughter at around age 78.  After a few years an EEG was reassuring and her medication were stopped again, she has been seizure free since that time.  Meds: Prescriptions prior to admission  Medication Sig Dispense Refill  . dicyclomine (BENTYL) 20 MG tablet Take 1 tablet (20 mg total) by mouth 3 (three) times daily before meals.  90 tablet  3  . OREG-PEPPERMINT-THYME-GLDNSEAL PO Take 1 each by mouth 3 (three) times daily.      . pantoprazole (PROTONIX) 40 MG tablet Take 1 tablet (40 mg total) by mouth daily.  30 tablet  3  . promethazine (PHENERGAN) 25 MG tablet Take 1 tablet (25 mg total) by mouth every 6 (six) hours as needed for nausea.  30 tablet  0  . Sodium Bicarbonate-Citric Acid (ALKA-SELTZER HEARTBURN PO) Take 2 each by mouth daily  as needed (for nausea).      . verapamil (CALAN) 120 MG tablet Take 120 mg by mouth daily as needed (for PVC's).        Allergies: Allergies as of 11/16/2012 - Review Complete 11/16/2012  Allergen Reaction Noted  . Sulfa antibiotics Rash    Past Medical History  Diagnosis Date  . Pulse irregularity   . Epilepsy     as child; has been on phenobarbital and tegretal in the past, at around 42 yo, had repeat EEG which was normal, and was tapered off seizure medications  . Seasonal allergies   . Asthma     allergy induced, allergic to long haired animals  . Anemia   . Erosive gastritis 03/15/2012   Past Surgical History  Procedure Laterality Date  . Dilation and curettage of uterus  05/29/06  . Tubal ligation  2000  .  Hand surgery  05-29-1999    repair of tendons, nerves after glass laceerated her hand.   Marland Kitchen Hernia repair  age 49    right inguinal.   . Esophagogastroduodenoscopy N/A 03/15/2012    Procedure: ESOPHAGOGASTRODUODENOSCOPY (EGD);  Surgeon: Iva Boop, MD;  Location: [facility] ENDOSCOPY;  Service: Endoscopy;  Laterality: N/A;   Family History  Problem Relation Age of Onset  . Breast cancer Mother 78    passed away in 2003/05/29  . Hypertension Father   . Hyperlipidemia Father   . Asthma Father     heavy smoker  . GER disease Father    History   Social History  . Marital Status: Married    Spouse Name: Dewey Neukam    Number of Children: 4  . Years of Education: RN   Occupational History  . RN [employer]  .     Social History Main Topics  . Smoking status: Former Smoker -- 1.00 packs/day for 20 years    Types: Cigarettes  . Smokeless tobacco: Never Used     Comment: quit jan 2012  . Alcohol Use: Yes     Comment: occasionally   . Drug Use: No  . Sexual Activity: Not on file   Other Topics Concern  . Not on file   Social History Narrative  . No narrative on file    Review of Systems: Review of Systems  Constitutional: Positive for diaphoresis (1 week ago). Negative for fever, chills and weight loss.  HENT: Negative for congestion and sore throat.   Eyes: Negative for blurred vision, double vision and discharge.  Respiratory: Negative for cough, shortness of breath and wheezing.   Cardiovascular: Negative for chest pain, palpitations and leg swelling.  Gastrointestinal: Positive for heartburn, nausea, vomiting and abdominal pain. Negative for diarrhea, constipation, blood in stool and melena.  Genitourinary: Negative for dysuria, frequency, hematuria and flank pain.  Musculoskeletal: Negative for back pain and myalgias.  Neurological: Negative for dizziness, focal weakness and headaches.  Psychiatric/Behavioral: Positive for depression. Negative for hallucinations and  substance abuse. The patient is nervous/anxious.      Physical Exam: Blood pressure 104/64, pulse 79, temperature 97.6 F (36.4 C), temperature source Oral, resp. rate 14, height 5\' 7"  (1.702 m), weight 169 lb 12.1 oz (77 kg), last menstrual period 11/09/2012, SpO2 95.00%. on RA Physical Exam  Nursing note and vitals reviewed. Constitutional: She is oriented to person, place, and time and well-developed, well-nourished, and in no distress. No distress.  HENT:  Head: Normocephalic and atraumatic.  Eyes: Conjunctivae and EOM are normal.  Neck:  Normal range of motion. Neck supple.  Cardiovascular: Normal rate, regular rhythm, normal heart sounds and intact distal pulses.   No murmur heard. Pulmonary/Chest: Effort normal and breath sounds normal. No respiratory distress. She has no wheezes. She has no rales.  Abdominal: Soft. Bowel sounds are normal. She exhibits no distension and no mass. There is tenderness (mild RLQ pain with deep palpation, minimal epigastric tenderness, no suprapubic tenderness). There is no rebound and no guarding.  Musculoskeletal: She exhibits no edema.  Neurological: She is alert and oriented to person, place, and time.  Skin: Skin is warm and dry. She is not diaphoretic.  Psychiatric: Affect normal.    Lab results: Basic Metabolic Panel:  Recent Labs  16/10/96 1530  NA 137  K 3.9  CL 100  CO2 23  GLUCOSE 88  BUN 9  CREATININE 0.75  CALCIUM 9.6  AG: 14 Liver Function Tests:  Recent Labs  11/16/12 1530  AST 21  ALT 17  ALKPHOS 58  BILITOT 0.4  PROT 7.9  ALBUMIN 4.2    Recent Labs  11/16/12 1530  LIPASE 26   CBC:  Recent Labs  11/16/12 1530  WBC 8.3  NEUTROABS 5.1  HGB 11.9*  HCT 34.9*  MCV 78.4  PLT 336   Urinalysis:  Recent Labs  11/16/12 1604  COLORURINE YELLOW  LABSPEC 1.012  PHURINE 6.0  GLUCOSEU NEGATIVE  HGBUR NEGATIVE  BILIRUBINUR NEGATIVE  KETONESUR NEGATIVE  PROTEINUR NEGATIVE  UROBILINOGEN 0.2  NITRITE  POSITIVE*  LEUKOCYTESUR MODERATE*    Imaging results:  No results found.  Other results: EKG: NSR, Rate 73, Normal Axis, No ST or T wave changes, unchanged from previous, QTc 424  Assessment & Plan by Problem:   Abdominal pain, acute, epigastric with Nausea and  Intractable vomiting   - Most likely due to patient's known history of erosive gastritis.  DDx includes Appendicitis (however patient only has very minimal pain in RLQ on exam), PUD (no ulcer on previous EGD in March, H/H stable), Neoplastic disease (MALToma related to H. Pylori, however H. Pylori was negative on previous EGD biopsy), Pancreatitis (unlikely given lipase of 26), Acute cholecystitis (no RUQ pain), UTI (U/A suggestive however patient denies symptoms and no suprapubic pain), Pregnancy (S/P tubal ligation, Upreg neg). -Admit to Observation for IV fluids and PPI treatment. - Continue Protonix drip, once bag finished switch to IV BID -  IVF NS at 100cc/hr - Phenergan for Nausea - Morphine for pain 2mg  IV q4prn - Continue dicyclomine 20mg  TID - Check H. Pylori stool - Given onset of pain >24 hours ago and No EKG changes will rule out ACS with single Troponin. - In Abdominal pain, N/V has not improved, or if hemoglobin drops in AM will consider repeat EGD and GI consult. -  Consider increasing PPI to BID on discharge.  Asymptomatic Bacteruria - U/A had many squamous cells, patient has no urinary symptoms.  Abdominal pain is not suprapubic. Has received 1 dose of ceftriaxone in ED. - D/C Abx Diet: Clears, NPO after midnight in case EGD needed. DVTPPx: Lovenox Dispo: Disposition is deferred at this time, awaiting improvement of current medical problems. Anticipated discharge in approximately 1 day(s).   The patient does not have a current PCP (No primary provider on file.) and does need an [facility] hospital follow-up appointment after discharge.  The patient does not have transportation limitations that hinder transportation  to clinic appointments.  Signed: Carlynn Purl, DO 11/16/2012, 9:28 PM

## 2012-11-16 NOTE — ED Notes (Signed)
Patient ambulatory to restroom with spouse. Gait steady

## 2012-11-16 NOTE — Progress Notes (Signed)
Pt admitted to the unit. Pt is alert and oriented. Pt oriented to room, staff, and call bell. Bed in lowest position. Full assessment to Epic. Call bell with in reach. Told to call for assists. Will continue to monitor.  Jenny Hudson, Jenny Hudson  

## 2012-11-16 NOTE — ED Provider Notes (Signed)
CSN: [id number]     Arrival date & time 11/16/12  1515 History   First MD Initiated Contact with Patient 11/16/12 1520     Chief Complaint  Patient presents with  . Abdominal Pain  . Nausea   Patient is a 42 y.o. female presenting with abdominal pain. The history is provided by the patient.  Abdominal Pain Pain location:  Epigastric Pain quality: burning   Pain severity:  Moderate Onset quality:  Gradual Duration:  1 day Timing:  Intermittent Progression:  Worsening Chronicity:  Recurrent Relieved by:  Nothing Worsened by:  Vomiting Associated symptoms: vomiting   Associated symptoms: no chest pain, no dysuria, no fever, no hematemesis, no hematochezia, no shortness of breath and no vaginal bleeding     Past Medical History  Diagnosis Date  . Chest pain   . Pulse irregularity   . Epilepsy     as child  . Seasonal allergies   . Asthma   . Anemia   . Erosive gastritis 03/15/2012   Past Surgical History  Procedure Laterality Date  . Dilation and curettage of uterus  2008  . Tubal ligation  2000  . Hand surgery  2001    repair of tendons, nerves after glass laceerated her hand.   Marland Kitchen Hernia repair  age 61    right inguinal.   . Esophagogastroduodenoscopy N/A 03/15/2012    Procedure: ESOPHAGOGASTRODUODENOSCOPY (EGD);  Surgeon: Iva Boop, MD;  Location: [facility] ENDOSCOPY;  Service: Endoscopy;  Laterality: N/A;   Family History  Problem Relation Age of Onset  . Breast cancer Mother    History  Substance Use Topics  . Smoking status: Former Games developer  . Smokeless tobacco: Never Used     Comment: quit jan 2012  . Alcohol Use: Yes   OB History   Grav Para Term Preterm Abortions TAB SAB Ect Mult Living                 Review of Systems  Constitutional: Negative for fever.  Respiratory: Negative for shortness of breath.   Cardiovascular: Negative for chest pain.  Gastrointestinal: Positive for vomiting and abdominal pain. Negative for hematochezia and hematemesis.   Genitourinary: Negative for dysuria and vaginal bleeding.  All other systems reviewed and are negative.    Allergies  Sulfa antibiotics  Home Medications   Current Outpatient Rx  Name  Route  Sig  Dispense  Refill  . dicyclomine (BENTYL) 20 MG tablet   Oral   Take 1 tablet (20 mg total) by mouth 3 (three) times daily before meals.   90 tablet   3   . methocarbamol (ROBAXIN) 500 MG tablet   Oral   Take 1 tablet (500 mg total) by mouth 3 (three) times daily as needed (muscle spasms).   90 tablet   2   . metroNIDAZOLE (FLAGYL) 500 MG tablet   Oral   Take 1 tablet (500 mg total) by mouth 2 (two) times daily. X 5 more days   10 tablet   0   . Nutritional Supplements (JUICE PLUS FIBRE PO)   Oral   Take 6 capsules by mouth daily.         . pantoprazole (PROTONIX) 40 MG tablet   Oral   Take 1 tablet (40 mg total) by mouth daily.   30 tablet   3   . polyethylene glycol (MIRALAX / GLYCOLAX) packet   Oral   Take 17 g by mouth as needed (constipation).   30 each  3   . promethazine (PHENERGAN) 25 MG tablet   Oral   Take 1 tablet (25 mg total) by mouth every 6 (six) hours as needed for nausea.   30 tablet   0   . verapamil (CALAN) 120 MG tablet   Oral   Take 120 mg by mouth daily as needed (for PVC's).          BP 110/73  Pulse 87  SpO2 99% Physical Exam CONSTITUTIONAL: Well developed/well nourished, actively vomiting HEAD: Normocephalic/atraumatic EYES: EOMI/PERRL, no icterus ENMT: Mucous membranes dry NECK: supple no meningeal signs SPINE:entire spine nontender CV: S1/S2 noted, no murmurs/rubs/gallops noted LUNGS: Lungs are clear to auscultation bilaterally, no apparent distress ABDOMEN: soft, mild epigastric tenderness, no rebound or guarding GU:no cva tenderness NEURO: Pt is awake/alert, moves all extremitiesx4 EXTREMITIES: pulses normal, full ROM SKIN: warm, color normal PSYCH: no abnormalities of mood noted  ED Course  Procedures  3:32  PM Pt reports h/o gastritis, usually will respond to meds but this episode did not improve Will start IV fluids/meds and reassess 6:02 PM Pt still with intractable nausea/vomiting Also noted to have UTI She will be unable to take PO meds Will admit for IV hydration, protonix (this helped symptoms last time) and IV antibiotics D/w internal medicine will admit   Labs Review Labs Reviewed  COMPREHENSIVE METABOLIC PANEL  LIPASE, BLOOD  URINALYSIS, ROUTINE W REFLEX MICROSCOPIC  CBC WITH DIFFERENTIAL     EKG Interpretation   None       MDM  No diagnosis found. Nursing notes including past medical history and social history reviewed and considered in documentation Previous records reviewed and considered - h/o gastritis noted per chart Labs/vital reviewed and considered     Joya Gaskins, MD 11/16/12 [phone]

## 2012-11-17 DIAGNOSIS — R112 Nausea with vomiting, unspecified: Secondary | ICD-10-CM

## 2012-11-17 DIAGNOSIS — R52 Pain, unspecified: Secondary | ICD-10-CM

## 2012-11-17 DIAGNOSIS — R1013 Epigastric pain: Secondary | ICD-10-CM

## 2012-11-17 DIAGNOSIS — R1115 Cyclical vomiting syndrome unrelated to migraine: Secondary | ICD-10-CM

## 2012-11-17 LAB — CBC
HCT: 29.3 % — ABNORMAL LOW (ref 36.0–46.0)
HCT: 31.5 % — ABNORMAL LOW (ref 36.0–46.0)
Hemoglobin: 9.7 g/dL — ABNORMAL LOW (ref 12.0–15.0)
Hemoglobin: 10.2 g/dL — ABNORMAL LOW (ref 12.0–15.0)
MCH: 26.7 pg (ref 26.0–34.0)
MCH: 26.3 pg (ref 26.0–34.0)
MCHC: 33.1 g/dL (ref 30.0–36.0)
MCHC: 32.4 g/dL (ref 30.0–36.0)
MCV: 80.7 fL (ref 78.0–100.0)
MCV: 81.2 fL (ref 78.0–100.0)
Platelets: 238 10*3/uL (ref 150–400)
Platelets: 250 10*3/uL (ref 150–400)
RBC: 3.63 MIL/uL — ABNORMAL LOW (ref 3.87–5.11)
RBC: 3.88 MIL/uL (ref 3.87–5.11)
RDW: 15.1 % (ref 11.5–15.5)
RDW: 15.1 % (ref 11.5–15.5)
WBC: 6.7 10*3/uL (ref 4.0–10.5)
WBC: 6.8 10*3/uL (ref 4.0–10.5)

## 2012-11-17 LAB — BASIC METABOLIC PANEL
BUN: 12 mg/dL (ref 6–23)
CO2: 21 mEq/L (ref 19–32)
Calcium: 8.3 mg/dL — ABNORMAL LOW (ref 8.4–10.5)
Chloride: 108 mEq/L (ref 96–112)
Creatinine, Ser: 0.84 mg/dL (ref 0.50–1.10)
GFR calc Af Amer: >90 mL/min (ref >90)
GFR calc non Af Amer: 86 mL/min — ABNORMAL LOW (ref >90)
Glucose, Bld: 70 mg/dL (ref 70–99)
Potassium: 3.9 mEq/L (ref 3.5–5.1)
Sodium: 141 mEq/L (ref 135–145)

## 2012-11-17 NOTE — H&P (Signed)
  Date: 11/17/2012  Patient name: Jenny Hudson [facility]  Medical record number: [id number]  Date of birth: 22-May-1970   I have seen and evaluated Jenny Hudson and discussed their care with the Residency Team.   Assessment and Plan: I have seen and evaluated the patient as outlined above. I agree with the formulated Assessment and Plan as detailed in the residents' admission note, with the following changes:   1. Epigastric Abdominal pain with nausea/vomiting - IV PPI and IVF with NS at 100cc/hr - Phenergan for nausea, morphine for pain - Continue home bentyl - GI consult for severe pain and burning pain in the chest  2. Asymptomatic bactiuria - Agree with holding therapy in this setting - Would recheck UA  Other issues per resident note.   Inez Catalina, MD 11/13/20143:48 PM

## 2012-11-17 NOTE — Progress Notes (Signed)
UR Completed.  Patient changed to inpatient status r/t being NPO and requiring IVF @ 100cc/hr and IV protonix gtt.

## 2012-11-17 NOTE — Progress Notes (Signed)
Subjective: [name] is a 42 y.o. female w/ PMHx of erosive gastritis (seen on EGD in 03/2012), epilepsy, seasonal allergies, presented to the ED on 11/16/12 w/ complaints of severe epigastric pain.   Patient seen at bedside this AM. Still in significant pain, tearful during examination. Claims the pain is only epigastric and radiates up chest towards throat. She has had this feeling before but never as severe. EGD was performed in 03/2012, showed erosive gastritis (antral). Complains of nausea, but with no vomiting at this time. Denies any other complaints at time. No SOB, diarrhea, melena, hematemesis, or dysuria.   Objective: Vital signs in last 24 hours: Filed Vitals:   11/16/12 1800 11/16/12 2050 11/17/12 0540 11/17/12 1436  BP: 107/56 104/64 99/61 102/65  Pulse: 77 79 73 83  Temp:  97.6 F (36.4 C) 98.2 F (36.8 C) 98 F (36.7 C)  TempSrc:  Oral Oral Oral  Resp: 14 14 14 16   Height:  5\' 7"  (1.702 m)    Weight:  169 lb 12.1 oz (77 kg)    SpO2: 100% 95% 95% 96%   Physical Exam: General: Alert, cooperative, and in mild distress 2/2 pain HEENT: Vision grossly intact, oropharynx clear and non-erythematous  Neck: Full range of motion without pain, supple, no lymphadenopathy or carotid bruits Lungs: Clear to ascultation bilaterally, normal work of respiration, no wheezes, rales, ronchi Heart: Regular rate and rhythm, no murmurs, gallops, or rubs Abdomen: Soft, tender in epigastric region, non-distended, normal bowel sounds. No guarding or rebound. McBurney's and Murphy's -ve.  Extremities: No cyanosis, clubbing, or edema Neurologic: Alert & oriented X3, cranial nerves II-XII intact, strength grossly intact, sensation intact to light touch  Lab Results: Basic Metabolic Panel:  Recent Labs Lab 11/16/12 1530 11/17/12 0920  NA 137 141  K 3.9 3.9  CL 100 108  CO2 23 21  GLUCOSE 88 70  BUN 9 12  CREATININE 0.75 0.84  CALCIUM 9.6 8.3*   Liver Function  Tests:  Recent Labs Lab 11/16/12 1530  AST 21  ALT 17  ALKPHOS 58  BILITOT 0.4  PROT 7.9  ALBUMIN 4.2    Recent Labs Lab 11/16/12 1530  LIPASE 26   No results found for this basename: AMMONIA,  in the last 168 hours CBC:  Recent Labs Lab 11/16/12 1530 11/17/12 0431 11/17/12 0920  WBC 8.3 6.7 6.8  NEUTROABS 5.1  --   --   HGB 11.9* 9.7* 10.2*  HCT 34.9* 29.3* 31.5*  MCV 78.4 80.7 81.2  PLT 336 238 250   Cardiac Enzymes:  Recent Labs Lab 11/16/12 2225  TROPONINI <0.30    Urinalysis:  Recent Labs Lab 11/16/12 1604  COLORURINE YELLOW  LABSPEC 1.012  PHURINE 6.0  GLUCOSEU NEGATIVE  HGBUR NEGATIVE  BILIRUBINUR NEGATIVE  KETONESUR NEGATIVE  PROTEINUR NEGATIVE  UROBILINOGEN 0.2  NITRITE POSITIVE*  LEUKOCYTESUR MODERATE*   Micro Results: Recent Results (from the past 240 hour(s))  URINE CULTURE     Status: None   Collection Time    11/16/12  4:04 PM      Result Value Range Status   Specimen Description URINE, CLEAN CATCH   Final   Special Requests NONE   Final   Culture  Setup Time     Final   Value: 11/16/2012 20:16     Performed at [facility]   Culture     Final   Value: >=100,000 COLONIES/mL GRAM NEGATIVE RODS     Performed at [facility]   Report Status PENDING   Incomplete   Medications: I have reviewed the patient's current medications. Scheduled Meds: . dicyclomine  20 mg Oral TID AC  . enoxaparin (LOVENOX) injection  40 mg Subcutaneous Q24H  . pantoprazole (PROTONIX) IV  40 mg Intravenous Q12H   Continuous Infusions: . sodium chloride 100 mL/hr at 11/17/12 1623   PRN Meds:.morphine injection, promethazine, promethazine  Assessment/Plan: [name] is a 42 y.o. female w/ PMHx of erosive gastritis (seen on EGD in 03/2012), epilepsy, seasonal allergies, presented to the ED on 11/16/12 w/ complaints of severe epigastric pain.   Epigastric pain w/ nausea/vomiting- Most likely due to patient's known  history of erosive gastritis. DDx still may include PUD (no ulcer on previous EGD in March, H/H stable), Neoplastic disease (MALToma related to H. Pylori, however H. Pylori was negative on previous EGD biopsy) - GI consulted, feel taht symptoms are most likely 2/2 functional GI problem at this time. If patient improves with supportive care, will arrange for GI follow up as outpatient. FOBT and H. Pylori Ag cancelled at this time as they will not change management. Suggests po and rectal antiemetic on discharge.  - Hb trend as follows:  Recent Labs Lab 11/16/12 1530 11/17/12 0431 11/17/12 0920  HGB 11.9* 9.7* 10.2*  HCT 34.9* 29.3* 31.5*  WBC 8.3 6.7 6.8  PLT 336 238 250  No concern of bleeding at this time. B12 and ferritin pending. - Continue Protonix IV BID  - IVF NS at 100cc/hr  - Phenergan for Nausea  - Morphine for pain 2mg  IV q4prn  - Continue dicyclomine 20mg  TID  - Check H. Pylori stool (most likely -ve given previous -ve result in 03/2012) - Troponin -ve x1 - Consider increasing PPI to BID on discharge.   Asymptomatic Bacteruria - U/A had many squamous cells, patient has no urinary symptoms. Abdominal pain is not suprapubic. Received 1 dose of ceftriaxone in ED.  - No treatment at this time.  Dispo: Disposition is deferred at this time, awaiting improvement of current medical problems.  Anticipated discharge in approximately 1-2 day(s).   The patient does have a current PCP (No primary provider on file.) and does not need an [facility] hospital follow-up appointment after discharge.  The patient does not have transportation limitations that hinder transportation to clinic appointments.  .Services Needed at time of discharge: Y = Yes, Blank = No PT:   OT:   RN:   Equipment:   Other:     LOS: 1 day   Courtney Paris, MD 11/17/2012, 5:52 PM Pager: [phone]
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
## 2012-11-18 LAB — FERRITIN: Ferritin: 13 ng/mL (ref 10–291)

## 2012-11-18 LAB — CBC
HCT: 30.5 % — ABNORMAL LOW (ref 36.0–46.0)
Hemoglobin: 9.8 g/dL — ABNORMAL LOW (ref 12.0–15.0)
MCH: 25.9 pg — ABNORMAL LOW (ref 26.0–34.0)
MCHC: 32.1 g/dL (ref 30.0–36.0)
MCV: 80.7 fL (ref 78.0–100.0)
Platelets: 236 10*3/uL (ref 150–400)
RBC: 3.78 MIL/uL — ABNORMAL LOW (ref 3.87–5.11)
RDW: 15.2 % (ref 11.5–15.5)
WBC: 6 10*3/uL (ref 4.0–10.5)

## 2012-11-18 LAB — URINE CULTURE: Culture: >=100000

## 2012-11-18 LAB — HIV ANTIBODY (ROUTINE TESTING W REFLEX): HIV: NONREACTIVE

## 2012-11-18 LAB — VITAMIN B12: Vitamin B-12: 534 pg/mL (ref 211–911)

## 2012-11-18 MED ORDER — PANTOPRAZOLE SODIUM 40 MG PO TBEC: 40.0000 mg | DELAYED_RELEASE_TABLET | Freq: Two times a day (BID) | ORAL | Status: DC

## 2012-11-18 NOTE — Progress Notes (Signed)
  Jenny Hudson to be D/C'd Home per MD order.  Discussed with the patient and all questions fully answered.    Medication List    STOP taking these medications       OREG-PEPPERMINT-THYME-GLDNSEAL PO      TAKE these medications       ALKA-SELTZER HEARTBURN PO  Take 2 each by mouth daily as needed (for nausea).     dicyclomine 20 MG tablet  Commonly known as:  BENTYL  Take 1 tablet (20 mg total) by mouth 3 (three) times daily before meals.     pantoprazole 40 MG tablet  Commonly known as:  PROTONIX  Take 1 tablet (40 mg total) by mouth 2 (two) times daily.     promethazine 25 MG tablet  Commonly known as:  PHENERGAN  Take 1 tablet (25 mg total) by mouth every 6 (six) hours as needed for nausea.     verapamil 120 MG tablet  Commonly known as:  CALAN  Take 120 mg by mouth daily as needed (for PVC's).        VVS, Skin clean, dry and intact without evidence of skin break down, no evidence of skin tears noted. IV catheter discontinued intact. Site without signs and symptoms of complications. Dressing and pressure applied.  An After Visit Summary was printed and given to the patient. Patient escorted via WC, and D/C home via private auto.  Laurel Dimmer 11/18/2012 6:45 PM

## 2012-11-18 NOTE — Progress Notes (Signed)
Subjective: [name] is a 42 y.o. female w/ PMHx of erosive gastritis (seen on EGD in 03/2012), epilepsy, seasonal allergies, presented to the ED on 11/16/12 w/ complaints of severe epigastric pain.   Patient seen at bedside this AM. Still with abdominal pain, no vomiting. Says she is feeling better today.  Objective: Vital signs in last 24 hours: Filed Vitals:   11/17/12 0540 11/17/12 1436 11/17/12 2100 11/18/12 0535  BP: 99/61 102/65 92/57 106/49  Pulse: 73 83 76 83  Temp: 98.2 F (36.8 C) 98 F (36.7 C) 98.7 F (37.1 C) 99.6 F (37.6 C)  TempSrc: Oral Oral Tympanic Oral  Resp: 14 16 16 16   Height:      Weight:      SpO2: 95% 96% 96% 94%   Physical Exam: General: Alert, cooperative, and in no acute distress. HEENT: Vision grossly intact, oropharynx clear and non-erythematous  Neck: Full range of motion without pain, supple, no lymphadenopathy or carotid bruits Lungs: Clear to ascultation bilaterally, normal work of respiration, no wheezes, rales, ronchi Heart: Regular rate and rhythm, no murmurs, gallops, or rubs Abdomen: Soft, tender in epigastric region, non-distended, normal bowel sounds. No guarding or rebound. McBurney's and Murphy's -ve.  Extremities: No cyanosis, clubbing, or edema Neurologic: Alert & oriented X3, cranial nerves II-XII intact, strength grossly intact, sensation intact to light touch  Lab Results: Basic Metabolic Panel:  Recent Labs Lab 11/16/12 1530 11/17/12 0920  NA 137 141  K 3.9 3.9  CL 100 108  CO2 23 21  GLUCOSE 88 70  BUN 9 12  CREATININE 0.75 0.84  CALCIUM 9.6 8.3*   Liver Function Tests:  Recent Labs Lab 11/16/12 1530  AST 21  ALT 17  ALKPHOS 58  BILITOT 0.4  PROT 7.9  ALBUMIN 4.2    Recent Labs Lab 11/16/12 1530  LIPASE 26   No results found for this basename: AMMONIA,  in the last 168 hours CBC:  Recent Labs Lab 11/16/12 1530  11/17/12 0920 11/18/12 0622  WBC 8.3  < > 6.8 6.0  NEUTROABS  5.1  --   --   --   HGB 11.9*  < > 10.2* 9.8*  HCT 34.9*  < > 31.5* 30.5*  MCV 78.4  < > 81.2 80.7  PLT 336  < > 250 236  < > = values in this interval not displayed. Cardiac Enzymes:  Recent Labs Lab 11/16/12 2225  TROPONINI <0.30    Urinalysis:  Recent Labs Lab 11/16/12 1604  COLORURINE YELLOW  LABSPEC 1.012  PHURINE 6.0  GLUCOSEU NEGATIVE  HGBUR NEGATIVE  BILIRUBINUR NEGATIVE  KETONESUR NEGATIVE  PROTEINUR NEGATIVE  UROBILINOGEN 0.2  NITRITE POSITIVE*  LEUKOCYTESUR MODERATE*   Micro Results: Recent Results (from the past 240 hour(s))  URINE CULTURE     Status: None   Collection Time    11/16/12  4:04 PM      Result Value Range Status   Specimen Description URINE, CLEAN CATCH   Final   Special Requests NONE   Final   Culture  Setup Time     Final   Value: 11/16/2012 20:16     Performed at [facility]   Culture     Final   Value: >=100,000 COLONIES/mL GRAM NEGATIVE RODS     Performed at [facility]   Report Status PENDING   Incomplete   Medications: I have reviewed the patient's current medications. Scheduled Meds: . dicyclomine  20 mg Oral TID  AC  . enoxaparin (LOVENOX) injection  40 mg Subcutaneous Q24H  . pantoprazole (PROTONIX) IV  40 mg Intravenous Q12H   Continuous Infusions: . sodium chloride 100 mL/hr at 11/17/12 1623   PRN Meds:.morphine injection, promethazine, promethazine  Assessment/Plan: [name] is a 42 y.o. female w/ PMHx of erosive gastritis (seen on EGD in 03/2012), epilepsy, seasonal allergies, presented to the ED on 11/16/12 w/ complaints of severe epigastric pain.   Epigastric pain w/ nausea/vomiting- Most likely due to patient's known history of erosive gastritis. DDx still may include PUD (no ulcer on previous EGD in March, H/H stable), Neoplastic disease (MALToma related to H. Pylori, however H. Pylori was negative on previous EGD biopsy) - GI consulted, feel that symptoms are most likely 2/2  functional GI problem at this time. If patient improves with supportive care, will arrange for GI follow up as outpatient. FOBT and H. Pylori Ag cancelled at this time as they will not change management. Suggests po and rectal antiemetic on discharge.  - Hb trend as follows:  Recent Labs Lab 11/17/12 0431 11/17/12 0920 11/18/12 0622  HGB 9.7* 10.2* 9.8*  HCT 29.3* 31.5* 30.5*  WBC 6.7 6.8 6.0  PLT 238 250 236  No concern of bleeding at this time. B12 and ferritin pending. - Continue Protonix IV BID  - IVF NS at 100cc/hr  - Phenergan for Nausea  - Morphine for pain 2mg  IV q4prn  - Continue dicyclomine 20mg  TID  - Check H. Pylori stool (most likely -ve given previous -ve result in 03/2012) - Troponin -ve x1 - Consider increasing PPI to BID on discharge.   Asymptomatic Bacteruria - U/A had many squamous cells, patient has no urinary symptoms. Abdominal pain is not suprapubic. Received 1 dose of ceftriaxone in ED.  - No treatment at this time.  Dispo: Disposition is deferred at this time, awaiting improvement of current medical problems.  Anticipated discharge today.  The patient does have a current PCP (No primary provider on file.) and does not need an [facility] hospital follow-up appointment after discharge.  The patient does not have transportation limitations that hinder transportation to clinic appointments.  .Services Needed at time of discharge: Y = Yes, Blank = No PT:   OT:   RN:   Equipment:   Other:     LOS: 2 days   Courtney Paris, MD 11/18/2012, 11:48 AM Pager: [phone]

## 2012-11-18 NOTE — Discharge Summary (Signed)
Name: Jenny Hudson MRN: [id number] DOB: Feb 07, 1970 42 y.o. PCP: No primary provider on file.  Date of Admission: 11/16/2012  3:16 PM Date of Discharge: 11/18/2012 Attending Physician: Inez Catalina, MD  Discharge Diagnosis: 1. Erosive gastritis-  2. Asymptomatic bacteriuria-  Discharge Medications:   Medication List    STOP taking these medications       OREG-PEPPERMINT-THYME-GLDNSEAL PO      TAKE these medications       ALKA-SELTZER HEARTBURN PO  Take 2 each by mouth daily as needed (for nausea).     dicyclomine 20 MG tablet  Commonly known as:  BENTYL  Take 1 tablet (20 mg total) by mouth 3 (three) times daily before meals.     pantoprazole 40 MG tablet  Commonly known as:  PROTONIX  Take 1 tablet (40 mg total) by mouth 2 (two) times daily.     promethazine 25 MG tablet  Commonly known as:  PHENERGAN  Take 1 tablet (25 mg total) by mouth every 6 (six) hours as needed for nausea.     verapamil 120 MG tablet  Commonly known as:  CALAN  Take 120 mg by mouth daily as needed (for PVC's).        Disposition and follow-up:   Ms.Jenny Hudson was discharged from [facility] in Good condition.  At the hospital follow up visit please address:  1.  Abdominal pain, nausea, vomiting. Improved after starting Protonix 40 mg bid?  2.  Labs / imaging needed at time of follow-up: CBC  3.  Pending labs/ test needing follow-up: none  Follow-up Appointments: Follow-up Information   Follow up with [facility] @ [facility]. (Will call you with follow up appointment.)    Contact information:   [address] [place] [address] [phone]      Discharge Instructions: Discharge Orders   Future Orders Complete By Expires   Call MD for:  persistant nausea and vomiting  As directed    Call MD for:  severe uncontrolled pain  As directed       Consultations: Treatment Team:  Iva Boop, MD  Admission  HPI:  Jenny Hudson is a 42 year old female with a PMH of Erosive gastritis, epilepsy, seasonal allergies with asthma, occasional palpitations. Patient reports 4 days PTA she went to a BBQ where she consumed buffalo wings, sangria, and other spicey foods. Since that time she has developed worsening epigastric abdominal pain. On the morning of Nov 12th, she reported this pain became severe, did not respond to her daily regimen of Protonix and Bentyl, or to treatment with alka-seltzer, almond milk, peppermint gel caps. She reports 4 episodes of NBNB emesis prior to arrival in the ED. She reports she has not been able to keep any food or liquid down all day.  She was previous admitted and treated for similar compliants in March of this year. At that time an EGD was completed which showed Erosive gastritis, biopsy for H.Pylori was negative. This was thought likely due to recent NSAIDs and Mobic use. She was started on Protonix 40mg  daily, Bentyl 20mg  TID, and instructed to avoid spicy foods and alcohol. She reports she has mostly made diet changes since that time but cannot always follow a strict diet. She does take her medications as prescribed, and was well controlled up until this point. She admits little alcohol intake other than at the [facility]. She is a former smoker (quit 2  years ago). And avoids NSAIDs. 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 She reports she does not like being ideal at home. She does not have a PCP and does not seek medical care frequently. She had a negative cardiac stress test in Aug 2012.  Patient reports a history of Epilepsy, she reports as a child she had 2 febrile and 2 nonfebrile seizures she was started on anti seizure medications, these were later discontinued and then restarted after a seizure after her first daughter at around age 42. After a few years an EEG was reassuring and her medication were stopped again, she has been seizure  free since that time.  Hospital Course by problem list:  1. Erosive gastritis- Patient presented w/ abdominal pain, nausea, vomiting after eating at a cookout and drinking sangria. Did not resolve for 2-3 days. Patient has had symptoms like this in the past, but not as severe. Patient has had EGD in 03/2012 which showed erosive antral gastritis, -ve for H. pylori. On admission, patient was started on Protonix drip and IVF, given phenergan for nausea, morphine for pain. ACS ruled out as epigastric pain could have been atypical presentation. Seen by Dr. Leone Payor, did not find it necessary to perform another EGD and felt that stool H. Pylori ag and FOBT were necessary as they would not change management. No significant blood loss. See trend as follows:  Recent Labs Lab 11/17/12 0431 11/17/12 0920 11/18/12 0622  HGB 9.7* 10.2* 9.8*  HCT 29.3* 31.5* 30.5*  WBC 6.7 6.8 6.0  PLT 238 250 236  Patient discharged on Protonix BID and she will arrange her own GI follow up . No further intervention was deemed necessary.  2. Asymptomatic bacteriuria- U/A had many squamous cells, patient had no urinary symptoms. Abdominal pain was not suprapubic. Received 1 dose of ceftriaxone in ED. No treatment continued.  Discharge Vitals:   BP 106/49  Pulse 83  Temp(Src) 99.6 F (37.6 C) (Oral)  Resp 16  Ht 5\' 7"  (1.702 m)  Wt 169 lb 12.1 oz (77 kg)  BMI 26.58 kg/m2  SpO2 94%  LMP 11/09/2012  Discharge Labs:  Results for orders placed during the hospital encounter of 11/16/12 (from the past 24 hour(s))  FERRITIN     Status: None   Collection Time    11/18/12  6:22 AM      Result Value Range   Ferritin 13  10 - 291 ng/mL  VITAMIN B12     Status: None   Collection Time    11/18/12  6:22 AM      Result Value Range   Vitamin B-12 534  211 - 911 pg/mL  CBC     Status: Abnormal   Collection Time    11/18/12  6:22 AM      Result Value Range   WBC 6.0  4.0 - 10.5 K/uL   RBC 3.78 (*) 3.87 - 5.11 MIL/uL    Hemoglobin 9.8 (*) 12.0 - 15.0 g/dL   HCT 16.1 (*) 09.6 - 04.5 %   MCV 80.7  78.0 - 100.0 fL   MCH 25.9 (*) 26.0 - 34.0 pg   MCHC 32.1  30.0 - 36.0 g/dL   RDW 40.9  81.1 - 91.4 %   Platelets 236  150 - 400 K/uL    Signed: Courtney Paris, MD 11/18/2012, 1:43 PM   Time Spent on Discharge: 35 minutes Services Ordered on Discharge: none Equipment Ordered  on Discharge: none

## 2012-11-18 NOTE — Progress Notes (Signed)
  Date: 11/18/2012  Patient name: Jenny Hudson  Medical record number: [id number]  Date of birth: Apr 19, 1970   This patient has been seen and the plan of care was discussed with the house staff. Please see their note for complete details. I concur with their findings with the following additions/corrections: Seen at bedside with team today. States she still has mild abdominal pain, but overall feels better. We discussed triggers for gastritis along with her husband at bedside.  Discussed BID PPI therapy. She is interested in a second opinion from another GI physician, we will try to have this done inpatient, but if it is not possible today, she agrees with outpatient follow up for further evaluation.  She denies any NSAID use and states she only "causually drinks". I don't think she is actively bleeding. Previous EGD and path report reviewed. No hx of H pylori or malignancy. She denies weight loss, dysphagia, odynophagia, or fever.  She is eating without difficulty. Medically stable for D/C if GI does not feel she needs another inpatient consult.  Jonah Blue, DO, FACP Faculty [facility] Internal Medicine Residency Program 11/18/2012, 12:53 PM

## 2012-11-18 NOTE — Care Management Note (Signed)
    Page 1 of 1   11/18/2012     4:53:25 PM   CARE MANAGEMENT NOTE 11/18/2012  Patient:  Jenny Hudson, Jenny Hudson   Account Number:  [id number]  Date Initiated:  11/18/2012  Documentation initiated by:  Letha Cape  Subjective/Objective Assessment:   dx abd pain  admit     Action/Plan:   Anticipated DC Date:  11/18/2012   Anticipated DC Plan:  HOME/SELF CARE      DC Planning Services  CM consult      Choice offered to / List presented to:             Status of service:  Completed, signed off Medicare Important Message given?   (If response is "NO", the following Medicare IM given date fields will be blank) Date Medicare IM given:   Date Additional Medicare IM given:    Discharge Disposition:  HOME/SELF CARE  Per UR Regulation:  Reviewed for med. necessity/level of care/duration of stay  If discussed at Long Length of Stay Meetings, dates discussed:    Comments:

## 2012-11-24 NOTE — Discharge Summary (Signed)
  Date: 11/24/2012  Patient name: Jenny Hudson [facility]  Medical record number: [id number]  Date of birth: 1970/02/06   This patient has been seen and the plan of care was discussed with the house staff. Please see their note for complete details. I concur with their findings and plan.  Jonah Blue, DO, FACP Faculty [facility] Internal Medicine Residency Program 11/24/2012, 11:10 AM

## 2012-12-20 ENCOUNTER — Other Ambulatory Visit (HOSPITAL_COMMUNITY)
Admission: RE | Admit: 2012-12-20 | Discharge: 2012-12-20 | Disposition: A | Discharge status: Home / Self Care | Payer: 59 | Source: Ambulatory Visit | Attending: Nurse Practitioner | Admitting: Nurse Practitioner

## 2012-12-20 ENCOUNTER — Other Ambulatory Visit: Payer: Self-pay | Admitting: Nurse Practitioner

## 2012-12-20 DIAGNOSIS — Z1151 Encounter for screening for human papillomavirus (HPV): Secondary | ICD-10-CM | POA: Insufficient documentation

## 2012-12-20 DIAGNOSIS — Z124 Encounter for screening for malignant neoplasm of cervix: Secondary | ICD-10-CM | POA: Insufficient documentation

## 2012-12-20 DIAGNOSIS — R87810 Cervical high risk human papillomavirus (HPV) DNA test positive: Secondary | ICD-10-CM | POA: Insufficient documentation

## 2012-12-22 ENCOUNTER — Other Ambulatory Visit: Payer: Self-pay

## 2012-12-22 DIAGNOSIS — Z803 Family history of malignant neoplasm of breast: Secondary | ICD-10-CM

## 2012-12-22 DIAGNOSIS — Z1231 Encounter for screening mammogram for malignant neoplasm of breast: Secondary | ICD-10-CM

## 2013-01-03 ENCOUNTER — Ambulatory Visit
Admission: RE | Admit: 2013-01-03 | Discharge: 2013-01-03 | Disposition: A | Discharge status: Home / Self Care | Payer: 59 | Source: Ambulatory Visit

## 2013-01-03 DIAGNOSIS — Z803 Family history of malignant neoplasm of breast: Secondary | ICD-10-CM

## 2013-01-03 DIAGNOSIS — Z1231 Encounter for screening mammogram for malignant neoplasm of breast: Secondary | ICD-10-CM

## 2013-01-26 ENCOUNTER — Other Ambulatory Visit: Payer: Self-pay | Admitting: Nurse Practitioner

## 2013-12-05 ENCOUNTER — Other Ambulatory Visit (HOSPITAL_COMMUNITY)
Admission: RE | Admit: 2013-12-05 | Discharge: 2013-12-05 | Disposition: A | Discharge status: Home / Self Care | Payer: 59 | Source: Ambulatory Visit | Attending: Nurse Practitioner | Admitting: Nurse Practitioner

## 2013-12-05 ENCOUNTER — Other Ambulatory Visit: Payer: Self-pay | Admitting: Nurse Practitioner

## 2013-12-05 DIAGNOSIS — Z01411 Encounter for gynecological examination (general) (routine) with abnormal findings: Secondary | ICD-10-CM | POA: Diagnosis not present

## 2013-12-05 DIAGNOSIS — Z1151 Encounter for screening for human papillomavirus (HPV): Secondary | ICD-10-CM | POA: Insufficient documentation

## 2013-12-06 LAB — CYTOLOGY - PAP

## 2014-01-16 ENCOUNTER — Other Ambulatory Visit: Payer: Self-pay

## 2014-01-16 DIAGNOSIS — Z1231 Encounter for screening mammogram for malignant neoplasm of breast: Secondary | ICD-10-CM

## 2014-01-25 ENCOUNTER — Ambulatory Visit: Payer: Self-pay

## 2014-02-27 ENCOUNTER — Telehealth: Payer: Self-pay | Admitting: Genetic Counselor

## 2014-02-27 NOTE — Telephone Encounter (Signed)
Lt mess regarding appt on 03/08/14 at 11 w/Genetics Dx: Genetic testing Referring [facility] Loma SenderAuma Thonteum, NP

## 2014-02-27 NOTE — Telephone Encounter (Signed)
Pt resched and confirmed for 03/22/14 for her genetic appt

## 2014-03-08 ENCOUNTER — Other Ambulatory Visit: Payer: Self-pay

## 2014-03-22 ENCOUNTER — Other Ambulatory Visit: Payer: Self-pay

## 2014-05-24 ENCOUNTER — Ambulatory Visit
Admission: RE | Admit: 2014-05-24 | Discharge: 2014-05-24 | Disposition: A | Discharge status: Home / Self Care | Payer: 59 | Source: Ambulatory Visit

## 2014-05-24 ENCOUNTER — Other Ambulatory Visit: Payer: Self-pay | Admitting: Nurse Practitioner

## 2014-05-24 DIAGNOSIS — Z1231 Encounter for screening mammogram for malignant neoplasm of breast: Secondary | ICD-10-CM

## 2014-08-08 ENCOUNTER — Other Ambulatory Visit: Payer: 59

## 2014-08-08 ENCOUNTER — Encounter: Payer: Self-pay | Admitting: Genetic Counselor

## 2014-08-08 ENCOUNTER — Ambulatory Visit (HOSPITAL_BASED_OUTPATIENT_CLINIC_OR_DEPARTMENT_OTHER): Payer: 59 | Admitting: Genetic Counselor

## 2014-08-08 DIAGNOSIS — Z800 Family history of malignant neoplasm of digestive organs: Secondary | ICD-10-CM | POA: Insufficient documentation

## 2014-08-08 DIAGNOSIS — Z803 Family history of malignant neoplasm of breast: Secondary | ICD-10-CM | POA: Insufficient documentation

## 2014-08-08 DIAGNOSIS — Z808 Family history of malignant neoplasm of other organs or systems: Secondary | ICD-10-CM

## 2014-08-08 NOTE — Progress Notes (Signed)
REFERRING PROVIDER: Wallene Huh, NP  PRIMARY PROVIDER:  No primary care provider on file.  PRIMARY REASON FOR VISIT:  1. Family history of breast cancer in mother   2. Family history of stomach cancer   3. Family history of melanoma   4. Family history of breast cancer      HISTORY OF PRESENT ILLNESS:   Jenny Hudson, a 44 y.o. female, was seen for a [facility] cancer genetics consultation at the request of Jenny Thongteum, NP due to a family history of cancer.  Jenny Hudson presents to clinic today to discuss the possibility of a hereditary predisposition to cancer, genetic testing, and to further clarify her future cancer risks, as well as potential cancer risks for family members. Jenny Hudson is a 44 y.o. female with no personal history of cancer.  She is worried about her family history of cancer and whether she or her children are at increased risk for developing cancer based on her family history.  CANCER HISTORY:   No history exists.     HORMONAL RISK FACTORS:  Menarche was at age 80.  First live birth at age 49.  OCP use for approximately 15+ years.  Ovaries intact: yes.  Hysterectomy: no.  Menopausal status: premenopausal.  HRT use: 0 years. Colonoscopy: no; not examined. Mammogram within the last year: yes. Number of breast biopsies: 0. Up to date with pelvic exams:  yes. Any excessive radiation exposure in the past:  no  Past Medical History  Diagnosis Date  . Pulse irregularity   . Epilepsy     as child; has been on phenobarbital and tegretal in the past, at around 44 yo, had repeat EEG which was normal, and was tapered off seizure medications  . Seasonal allergies   . Asthma     allergy induced, allergic to long haired animals  . Anemia   . Erosive gastritis 03/15/2012  . Family history of breast cancer   . Family history of stomach cancer   . Family history of melanoma     Past Surgical History  Procedure Laterality Date  . Dilation and  curettage of uterus  2008  . Tubal ligation  2000  . Hand surgery  2001    repair of tendons, nerves after glass laceerated her hand.   Marland Kitchen Hernia repair  age 71    right inguinal.   . Esophagogastroduodenoscopy N/A 03/15/2012    Procedure: ESOPHAGOGASTRODUODENOSCOPY (EGD);  Surgeon: Gatha Mayer, MD;  Location: [facility] ENDOSCOPY;  Service: Endoscopy;  Laterality: N/A;    History   Social History  . Marital Status: Married    Spouse Name: Jenny Hudson  . Number of Children: 4  . Years of Education: RN   Occupational History  . RN [employer]  .     Social History Main Topics  . Smoking status: Former Smoker -- 1.00 packs/day for 20 years    Types: Cigarettes    Quit date: 08/08/2011  . Smokeless tobacco: Never Used     Comment: quit jan 2012  . Alcohol Use: Yes     Comment: occasionally   . Drug Use: No  . Sexual Activity: Not on file   Other Topics Concern  . None   Social History Narrative     FAMILY HISTORY:  We obtained a detailed, 4-generation family history.  Significant diagnoses are listed below: Family History  Problem Relation Age of Onset  . Breast cancer Mother 61    passed  away in 2005  . Hypertension Father   . Hyperlipidemia Father   . Asthma Father     heavy smoker  . GER disease Father   . Stomach cancer Maternal Uncle     late 40s-early 72s  . Lung cancer Paternal Uncle     smoker  . Melanoma Maternal Grandmother     dx in her late 30s-early 64s   The patient has four children ranging from 7-22 who are healthy and cancer free.  She has one sister who is also cancer free.  Her mother was diagnosed with triple positive breast cancer at age 77 and died at 55.  Her mother had one brother who died of a "rare stomach cancer" in his late 62s to early 9s.  Her mother also has a sister who is living and an adopted brother who is deceased.  The patient's maternal grandmother died in her late 26s from Melanoma and her grandfather died from  heart failure.  The patient's father is alive at 28.  He had 8 brothers and 2 sisters, one brother who was a smoker died of lung cancer.  There is no other reported history of cancer.  Patient's maternal ancestors are of [place] descent, and paternal ancestors are of [place] and [place] descent. There is no reported Ashkenazi Jewish ancestry. There is no known consanguinity.  GENETIC COUNSELING ASSESSMENT: [name] is a 44 y.o. female with a family history of breast and stomach cancer and melanoma which somewhat suggestive of a sporadic or rarely a hereditary cancer syndrome and predisposition to cancer. We, therefore, discussed and recommended the following at today's visit.   DISCUSSION: We discussed that her uncle was diagnosed relatively early with gastric cancer and her grandmother was diagnosed young with melanoma, however neither of these cancers have specific guidelines for genetic testing.  Her mother was diagnosed with Her2 positive breast cancer at 92, which does not overtly meet the medical criteria for testing.  We discussed that stomach and breast cancer can be seen with CDH1 mutations, and breast and melanoma have been seen in several different hereditary breast cancer syndromes.  We reviewed the characteristics, features and inheritance patterns of hereditary cancer syndromes. We also discussed genetic testing, including the appropriate family members to test, the process of testing, insurance coverage and turn-around-time for results. We discussed the implications of a negative, positive and/or variant of uncertain significant result. Based on her concern, we recommended Jenny Hudson pursue genetic testing for the [facility] breast, gastric and melanoam gene panel. The Breast/Gastric/Melanoma gene panel offered by [facility] includes sequencing and rearrangement analysis for the following 44 genes:  AKT1, APC, ATM, BAP1, BARD1, BMPR1A, BRCA1, BRCA2, BRIP1, CDH1, CDK4, CDKN2A, CHEK2, CTNNA1,  EPCAM, FAM175A, FANCC, KIT, MITF, MLH1, MRE11A, MSH2, MSH6, MUTYH, NBN, NF1, PALB2, PDGFRA, PIK3CA, PMS2, PTEN, RAD50, RAD51C, RAD51D, RB1, RINT1, SDHA, SDHB, SDHC, SDHD, SMAD4, STK11, TP53, XRCC2.     Based on Jenny Hudson's family history of cancer, she does not fully meet medical criteria for genetic testing. The genetic testing laboratory will preauthorize her sample.  If her insurance does not cover testing, or if she would owe more than $100 out of pocket, they will call her and discuss her options.  [facility] does allow a self-pay option for $475, so if she is not able to qualify for financial assistance, she could continue with testing with this self pay option.     PLAN: After considering the risks, benefits, and limitations, Jenny Hudson  provided  informed consent to pursue genetic testing and the blood sample was sent to [facility] for analysis of the Breast/Gastric/Melanoma gene panel. Results should be available within approximately 2-3 weeks' time, at which point they will be disclosed by telephone to Jenny Hudson, as will any additional recommendations warranted by these results. Jenny Hudson will receive a summary of her genetic counseling visit and a copy of her results once available. This information will also be available in Epic. We encouraged Jenny Hudson to remain in contact with cancer genetics annually so that we can continuously update the family history and inform her of any changes in cancer genetics and testing that may be of benefit for her family. Jenny Hudson questions were answered to her satisfaction today. Our contact information was provided should additional questions or concerns arise.  Lastly, we encouraged Jenny Hudson to remain in contact with cancer genetics annually so that we can continuously update the family history and inform her of any changes in cancer genetics and testing that may be of benefit for this family.   Ms.  Hudson questions  were answered to her satisfaction today. Our contact information was provided should additional questions or concerns arise. Thank you for the referral and allowing [place] to share in the care of your patient.   Karen P. Florene Glen, [place], [facility] Certified Genetic Counselor [email or web address] phone: [phone]  The patient was seen for a total of 49 minutes in face-to-face genetic counseling.  This patient was discussed with Drs. Magrinat, Lindi Adie and/or Burr Medico who agrees with the above.    _______________________________________________________________________ For Office Staff:  Number of people involved in session: 1 Was an Intern/ student involved with case: no

## 2014-08-14 ENCOUNTER — Ambulatory Visit: Payer: Self-pay | Admitting: Genetic Counselor

## 2014-08-14 ENCOUNTER — Telehealth: Payer: Self-pay | Admitting: Genetic Counselor

## 2014-08-14 ENCOUNTER — Encounter: Payer: Self-pay | Admitting: Genetic Counselor

## 2014-08-14 DIAGNOSIS — Z1379 Encounter for other screening for genetic and chromosomal anomalies: Secondary | ICD-10-CM | POA: Insufficient documentation

## 2014-08-14 DIAGNOSIS — Z800 Family history of malignant neoplasm of digestive organs: Secondary | ICD-10-CM

## 2014-08-14 DIAGNOSIS — Z808 Family history of malignant neoplasm of other organs or systems: Secondary | ICD-10-CM

## 2014-08-14 DIAGNOSIS — Z803 Family history of malignant neoplasm of breast: Secondary | ICD-10-CM

## 2014-08-14 NOTE — Progress Notes (Signed)
HPI: Ms. Guynes was previously seen in the [facility] due to a family history of cancer and concerns regarding a hereditary predisposition to cancer. Please refer to our prior cancer genetics clinic note for more information regarding Ms. Sandy's medical, social and family histories, and our assessment and recommendations, at the time. Ms. Heimann recent genetic test results were disclosed to her, as were recommendations warranted by these results. These results and recommendations are discussed in more detail below.  GENETIC TEST RESULTS: At the time of Ms. Mowbray's visit, we recommended she pursue genetic testing of the [facility] custom gene panel. The Breast/Gastric/Melanoma gene panel offered by [facility] includes sequencing and rearrangement analysis for the following 42 genes: AKT1, APC, ATM, BAP1, BARD1, BMPR1A, BRCA1, BRCA2, BRIP1, CDH1, CDK4, CDKN2A, CHEK2, CTNNA1, EPCAM, FAM175A, FANCC, KIT, MLH1, MRE11A, MSH2, MSH6, MUTYH, NBN, NF1, PALB2, PDGFRA, PIK3CA, PMS2, PTEN, RAD50, RAD51C, RAD51D, RB1, RINT1, SDHB, SDHC, SDHD, SMAD4, STK11, TP53, XRCC2. MITF (c.952G>A, p.Glu318Lys variant only) and SDHA genes were evaluated for sequence changes only. The report date is August 13, 2014.  Genetic testing was normal, and did not reveal a deleterious mutation in these genes. The test report has been scanned into EPIC and is located under the Media tab.   We discussed with Ms. Bober that since the current genetic testing is not perfect, it is possible there may be a gene mutation in one of these genes that current testing cannot detect, but that chance is small. We also discussed, that it is possible that another gene that has not yet been discovered, or that we have not yet tested, is responsible for the cancer diagnoses in the family, and it is, therefore, important to remain in touch with cancer genetics in the future so that we can continue to offer Ms. Greaves the  most up to date genetic testing.   CANCER SCREENING RECOMMENDATIONS: This normal result is reassuring and indicates that Ms. Cerrito does not likely have an increased risk of cancer due to a mutation in one of these genes.  We, therefore, recommended  Ms. Kapral continue to follow the cancer screening guidelines provided by her primary healthcare providers.   RECOMMENDATIONS FOR FAMILY MEMBERS: Individuals in this family might be at some increased risk of developing cancer, over the general population risk, simply due to the family history of cancer. We recommended women in this family have a yearly mammogram beginning at age 43, or 69 years younger than the earliest onset of cancer, an an annual clinical breast exam, and perform monthly breast self-exams. Women in this family should also have a gynecological exam as recommended by their primary provider. All family members should have a colonoscopy by age 55, and should consider skin exams to screen for Melanoma.  FOLLOW-UP: Lastly, we discussed with Ms. Stice that cancer genetics is a rapidly advancing field and it is possible that new genetic tests will be appropriate for her and/or her family members in the future. We encouraged her to remain in contact with cancer genetics on an annual basis so we can update her personal and family histories and let her know of advances in cancer genetics that may benefit this family.   Our contact number was provided. Ms. Bastedo questions were answered to her satisfaction, and she knows she is welcome to call us at anytime with additional questions or concerns.   Roma Kayser, MS, [facility] Certified Genetic Counselor [email or web address]

## 2014-08-14 NOTE — Telephone Encounter (Signed)
Negative genetic testing on a 44 gene panel looking for hereditary mutations in genes associated with breast and gastric cancer, as well as Melanoma.

## 2014-08-14 NOTE — Telephone Encounter (Signed)
Left good news message on VM.  Asked that she call back. 

## 2014-09-07 ENCOUNTER — Encounter (HOSPITAL_COMMUNITY): Payer: Self-pay

## 2014-11-25 ENCOUNTER — Emergency Department (HOSPITAL_COMMUNITY): Payer: 59

## 2014-11-25 ENCOUNTER — Encounter (HOSPITAL_COMMUNITY): Payer: Self-pay | Admitting: Emergency Medicine

## 2014-11-25 ENCOUNTER — Emergency Department (HOSPITAL_COMMUNITY)
Admission: EM | Admit: 2014-11-25 | Discharge: 2014-11-25 | Disposition: A | Discharge status: Home / Self Care | Payer: 59 | Attending: Emergency Medicine | Admitting: Emergency Medicine

## 2014-11-25 DIAGNOSIS — K2900 Acute gastritis without bleeding: Secondary | ICD-10-CM | POA: Diagnosis not present

## 2014-11-25 DIAGNOSIS — Z9851 Tubal ligation status: Secondary | ICD-10-CM | POA: Diagnosis not present

## 2014-11-25 DIAGNOSIS — Z79899 Other long term (current) drug therapy: Secondary | ICD-10-CM | POA: Diagnosis not present

## 2014-11-25 DIAGNOSIS — Z3202 Encounter for pregnancy test, result negative: Secondary | ICD-10-CM | POA: Diagnosis not present

## 2014-11-25 DIAGNOSIS — R1013 Epigastric pain: Secondary | ICD-10-CM | POA: Diagnosis present

## 2014-11-25 DIAGNOSIS — R42 Dizziness and giddiness: Secondary | ICD-10-CM | POA: Insufficient documentation

## 2014-11-25 DIAGNOSIS — Z862 Personal history of diseases of the blood and blood-forming organs and certain disorders involving the immune mechanism: Secondary | ICD-10-CM | POA: Diagnosis not present

## 2014-11-25 DIAGNOSIS — K2960 Other gastritis without bleeding: Secondary | ICD-10-CM

## 2014-11-25 DIAGNOSIS — Z87891 Personal history of nicotine dependence: Secondary | ICD-10-CM | POA: Diagnosis not present

## 2014-11-25 DIAGNOSIS — J45901 Unspecified asthma with (acute) exacerbation: Secondary | ICD-10-CM | POA: Diagnosis not present

## 2014-11-25 LAB — COMPREHENSIVE METABOLIC PANEL
ALT: 16 U/L (ref 14–54)
AST: 17 U/L (ref 15–41)
Albumin: 4 g/dL (ref 3.5–5.0)
Alkaline Phosphatase: 45 U/L (ref 38–126)
Anion gap: 6 (ref 5–15)
BUN: 15 mg/dL (ref 6–20)
CO2: 23 mmol/L (ref 22–32)
Calcium: 9.1 mg/dL (ref 8.9–10.3)
Chloride: 109 mmol/L (ref 101–111)
Creatinine, Ser: 0.79 mg/dL (ref 0.44–1.00)
GFR calc Af Amer: >60 mL/min (ref >60)
GFR calc non Af Amer: >60 mL/min (ref >60)
Glucose, Bld: 103 mg/dL — ABNORMAL HIGH (ref 65–99)
Potassium: 4.2 mmol/L (ref 3.5–5.1)
Sodium: 138 mmol/L (ref 135–145)
Total Bilirubin: 0.6 mg/dL (ref 0.3–1.2)
Total Protein: 7.3 g/dL (ref 6.5–8.1)

## 2014-11-25 LAB — CBC
HCT: 37.5 % (ref 36.0–46.0)
Hemoglobin: 12.3 g/dL (ref 12.0–15.0)
MCH: 27.2 pg (ref 26.0–34.0)
MCHC: 32.8 g/dL (ref 30.0–36.0)
MCV: 82.8 fL (ref 78.0–100.0)
Platelets: 246 10*3/uL (ref 150–400)
RBC: 4.53 MIL/uL (ref 3.87–5.11)
RDW: 14.3 % (ref 11.5–15.5)
WBC: 8.5 10*3/uL (ref 4.0–10.5)

## 2014-11-25 LAB — I-STAT TROPONIN, ED: Troponin i, poc: 0 ng/mL (ref 0.00–0.08)

## 2014-11-25 LAB — I-STAT BETA HCG BLOOD, ED (MC, WL, AP ONLY): I-stat hCG, quantitative: <5 m[IU]/mL (ref <5)

## 2014-11-25 LAB — LIPASE, BLOOD: Lipase: 36 U/L (ref 11–51)

## 2014-11-25 MED ORDER — SUCRALFATE 1 G PO TABS: 1.0000 g | ORAL_TABLET | Freq: Four times a day (QID) | ORAL | Status: DC

## 2014-11-25 MED ORDER — PANTOPRAZOLE SODIUM 40 MG IV SOLR
40.0000 mg | Freq: Once | INTRAVENOUS | Status: AC
  Administered 2014-11-25: 40 mg via INTRAVENOUS
  Filled 2014-11-25: qty 40

## 2014-11-25 MED ORDER — ONDANSETRON HCL 4 MG/2ML IJ SOLN
4.0000 mg | Freq: Once | INTRAMUSCULAR | Status: AC | PRN
  Administered 2014-11-25: 4 mg via INTRAVENOUS
  Filled 2014-11-25: qty 2

## 2014-11-25 MED ORDER — HYDROMORPHONE HCL 1 MG/ML IJ SOLN
1.0000 mg | Freq: Once | INTRAMUSCULAR | Status: AC
  Administered 2014-11-25: 1 mg via INTRAVENOUS
  Filled 2014-11-25: qty 1

## 2014-11-25 MED ORDER — ONDANSETRON HCL 4 MG/2ML IJ SOLN
4.0000 mg | Freq: Once | INTRAMUSCULAR | Status: AC
Start: 2014-11-25 — End: 2014-11-25
  Administered 2014-11-25: 4 mg via INTRAVENOUS

## 2014-11-25 MED ORDER — ONDANSETRON HCL 4 MG PO TABS: 4.0000 mg | ORAL_TABLET | Freq: Three times a day (TID) | ORAL | Status: DC | PRN

## 2014-11-25 MED ORDER — ONDANSETRON 4 MG PO TBDP
4.0000 mg | ORAL_TABLET | Freq: Once | ORAL | Status: AC
  Administered 2014-11-25: 4 mg via ORAL
  Filled 2014-11-25: qty 1

## 2014-11-25 MED ORDER — SUCRALFATE 1 G PO TABS
1.0000 g | ORAL_TABLET | Freq: Once | ORAL | Status: AC
  Administered 2014-11-25: 1 g via ORAL
  Filled 2014-11-25: qty 1

## 2014-11-25 MED ORDER — IOHEXOL 300 MG/ML  SOLN
100.0000 mL | Freq: Once | INTRAMUSCULAR | Status: AC | PRN
  Administered 2014-11-25: 100 mL via INTRAVENOUS

## 2014-11-25 NOTE — ED Notes (Signed)
Patient transported to CT 

## 2014-11-25 NOTE — ED Notes (Signed)
Pt given water for PO challenge 

## 2014-11-25 NOTE — ED Notes (Signed)
Pt reports gradual onset of abd pain pain in her epigastric region which radiates throughout her entire abd. Pt reports that she also has noticed distention starting this morning. Pt also reports hx of gastric erosion. Pt on 80mg  of Protonix daily. Pt alert x4.

## 2014-11-25 NOTE — Discharge Instructions (Signed)
1. Medications: Zofran for nausea, carafate for pain, continue protonix and other usual home medications 2. Treatment: rest; drink plenty of fluids; avoid spicy foods, caffeine, foods high in grease 3. Follow Up: Please follow up with GI at their earliest appointment and with your primary doctor within 3 days for discussion of your diagnoses and further evaluation after today's visit; Please return to the ER for any new or worsening conditions, any additional concerns.

## 2014-11-25 NOTE — ED Notes (Signed)
CT notified that pt is finished drinking her contrast.

## 2014-11-25 NOTE — ED Provider Notes (Signed)
CSN: [id number]     Arrival date & time 11/25/14  0910 History   First MD Initiated Contact with Patient 11/25/14 [phone]     Chief Complaint  Patient presents with  . Abdominal Pain     (Consider location/radiation/quality/duration/timing/severity/associated sxs/prior Treatment) HPI  Jenny Hudson is a 44 y.o. female  with a PMH of erosive gastritis who presents to the Emergency Department complaining of gradual sharp epigastric pain that began early last night. Pt states pain changed to tearing epigastric pain acute at ~ 2 am. Pain is worse when laying flat, stating she feels like she is "suffocating". Associated sxs include dizziness and nausea. Denies vomiting. Normal BM's daily. Denies melena, hematochezia, hematemesis Pt. Is prescribed  Protonix daily and has been compliant with this regimen. Patient states this pain is different and more severe than previous visits for gastritis.    Past Medical History  Diagnosis Date  . Pulse irregularity   . Epilepsy (HCC)     as child; has been on phenobarbital and tegretal in the past, at around 44 yo, had repeat EEG which was normal, and was tapered off seizure medications  . Seasonal allergies   . Asthma     allergy induced, allergic to long haired animals  . Anemia   . Erosive gastritis 03/15/2012  . Family history of breast cancer   . Family history of stomach cancer   . Family history of melanoma    Past Surgical History  Procedure Laterality Date  . Dilation and curettage of uterus  21-May-2006  . Tubal ligation  2000  . Hand surgery  05-21-99    repair of tendons, nerves after glass laceerated her hand.   Marland Kitchen Hernia repair  age 9    right inguinal.   . Esophagogastroduodenoscopy N/A 03/15/2012    Procedure: ESOPHAGOGASTRODUODENOSCOPY (EGD);  Surgeon: Iva Boop, MD;  Location: [facility] ENDOSCOPY;  Service: Endoscopy;  Laterality: N/A;   Family History  Problem Relation Age of Onset  . Breast cancer Mother 61    passed away in May 21, 2003    . Hypertension Father   . Hyperlipidemia Father   . Asthma Father     heavy smoker  . GER disease Father   . Stomach cancer Maternal Uncle     late 40s-early 30s  . Lung cancer Paternal Uncle     smoker  . Melanoma Maternal Grandmother     dx in her late 57s-early 1s   Social History  Substance Use Topics  . Smoking status: Former Smoker -- 1.00 packs/day for 20 years    Types: Cigarettes    Quit date: 08/08/2011  . Smokeless tobacco: Never Used     Comment: quit jan 2012  . Alcohol Use: Yes     Comment: occasionally    OB History    No data available     Review of Systems  Constitutional: Negative.   HENT: Negative for congestion, rhinorrhea and sore throat.   Eyes: Negative for visual disturbance.  Respiratory: Positive for shortness of breath. Negative for cough and wheezing.   Cardiovascular: Negative for chest pain, palpitations and leg swelling.  Gastrointestinal: Positive for nausea and abdominal pain. Negative for vomiting, diarrhea and constipation.  Genitourinary: Negative for vaginal bleeding and difficulty urinating.  Musculoskeletal: Negative for myalgias, back pain, arthralgias and neck pain.  Skin: Negative for rash.  Neurological: Negative for dizziness, weakness and headaches.      Allergies  Sulfa antibiotics  Home Medications   Prior  to Admission medications   Medication Sig Start Date End Date Taking? Authorizing Provider  aluminum hydroxide-magnesium carbonate (GAVISCON) 95-358 MG/15ML SUSP Take 15 mLs by mouth 4 (four) times daily - after meals and at bedtime. heartburn   Yes Historical Provider, MD  Multiple Vitamins-Minerals (MULTIVITAMIN WITH MINERALS) tablet Take 1 tablet by mouth daily.   Yes Historical Provider, MD  pantoprazole (PROTONIX) 40 MG tablet Take 1 tablet (40 mg total) by mouth 2 (two) times daily. 11/18/12  Yes Courtney Paris, MD  Sodium Bicarbonate-Citric Acid (ALKA-SELTZER HEARTBURN PO) Take 2 each by mouth daily as needed  (for nausea).   Yes Historical Provider, MD  ondansetron (ZOFRAN) 4 MG tablet Take 1 tablet (4 mg total) by mouth every 8 (eight) hours as needed for nausea or vomiting. 11/25/14   Chase Picket Ward, PA-C  sucralfate (CARAFATE) 1 G tablet Take 1 tablet (1 g total) by mouth QID. One hour before meals and at bedtime When symptoms are bearable, start taking 1 tablet BID 11/25/14   [facility] Ward, PA-C   BP 110/71 mmHg  Pulse 67  Temp(Src) 98.6 F (37 C) (Oral)  Resp 14  SpO2 98%  LMP 11/04/2014 Physical Exam  Constitutional: She is oriented to person, place, and time. She appears well-developed and well-nourished.  Appears in pain but in no acute distress  HENT:  Head: Normocephalic and atraumatic.  Cardiovascular: Normal rate, regular rhythm, normal heart sounds and intact distal pulses.  Exam reveals no gallop and no friction rub.   No murmur heard. Pulmonary/Chest: Effort normal and breath sounds normal. No respiratory distress. She has no wheezes. She has no rales. She exhibits no tenderness.  Abdominal: She exhibits no mass. There is no rebound and no guarding.    Abdomen soft, non-distended Tenderness as depicted in image; worst at epigastrium Bowel sounds positive in all four quadrants  Musculoskeletal: She exhibits no edema.  Neurological: She is alert and oriented to person, place, and time.  Skin: Skin is warm and dry. No rash noted.  Psychiatric: She has a normal mood and affect. Her behavior is normal. Judgment and thought content normal.  Nursing note and vitals reviewed.   ED Course  Procedures (including critical care time) Labs Review Labs Reviewed  COMPREHENSIVE METABOLIC PANEL - Abnormal; Notable for the following:    Glucose, Bld 103 (*)    All other components within normal limits  LIPASE, BLOOD  CBC  I-STAT TROPOININ, ED  I-STAT BETA HCG BLOOD, ED ([facility], [facility], [facility] ONLY)    Imaging Review Dg Chest 2 View  11/25/2014  CLINICAL DATA:  Shortness of  breath.  Abdominal pain. EXAM: CHEST  2 VIEW COMPARISON:  03/10/2012 chest radiograph. FINDINGS: Stable cardiomediastinal silhouette with normal heart size. No pneumothorax. No pleural effusion. Clear lungs, with no focal lung consolidation and no pulmonary edema. IMPRESSION: No active cardiopulmonary disease. Electronically Signed   By: Delbert Phenix M.D.   On: 11/25/2014 10:44   Ct Abdomen Pelvis W Contrast  11/25/2014  CLINICAL DATA:  Worsening epigastric and generalized abdominal pain, and nausea. EXAM: CT ABDOMEN AND PELVIS WITH CONTRAST TECHNIQUE: Multidetector CT imaging of the abdomen and pelvis was performed using the standard protocol following bolus administration of intravenous contrast. CONTRAST:  OMNIPAQUE IOHEXOL 300 MG/ML  SOLN COMPARISON:  CT abdomen pelvis 03/14/2012 FINDINGS: Lower chest:  No acute findings. Hepatobiliary: No masses or other significant abnormality. Stable 4 mm right hepatic cyst. Gallbladder has normal appearance. Pancreas: No mass, inflammatory changes,  or other significant abnormality. Spleen: Within normal limits in size and appearance. Adrenals/Urinary Tract: No masses identified. No evidence of hydronephrosis. Stomach/Bowel: No evidence of obstruction, inflammatory process, or abnormal fluid collections. The appendix is normal. Vascular/Lymphatic: No pathologically enlarged lymph nodes. No evidence of abdominal aortic aneurysm. Reproductive: The uterus and left ovary have grossly normal appearance. There is a cystic structure measuring 3 cm in greatest dimension associated with the right adnexa Other: Small amount of free fluid in the pelvis. Musculoskeletal:  No suspicious bone lesions identified. IMPRESSION: No evidence of abnormalities within the abdomen. 3 cm cystic structure associated with the right adnexa. This may represent a physiologic cyst. However if further imaging is desired, pelvic ultrasound may be considered. Electronically Signed   By: Ted Mcalpineobrinka   Dimitrova M.D.   On: 11/25/2014 12:47   I have personally reviewed and evaluated these images and lab results as part of my medical decision-making.   EKG Interpretation None      MDM   Final diagnoses:  Erosive gastritis  Abdominal pain, acute, epigastric   Thalia P Murrill presents with epigastric pain which is more severe than her baseline gastritis pain. Concerned for bleeding ulcer  Labs: Trop & hcg negative; lipase & cbc wdl; cmp reassuring CXR shows no acute cardiopulm dz - no pna, no pneumothorax, no widening of mediastinum  CT shows no abdominal abnormalities  Likely acute on chronic erosive gastritis / PUD  Patient responded well to carafate and zofran, feels much improved and able to go home. Will dc home with these medications and recommend GI and PCP follow up.  Patient discussed with Dr. Donnald GarrePfeiffer who agrees with treatment plan.    [facility] JerseyJaime Pilcher Ward, PA-C 11/25/14 1550  Arby BarretteMarcy Pfeiffer, MD 11/26/14 [phone]

## 2014-12-27 ENCOUNTER — Other Ambulatory Visit: Payer: Self-pay | Admitting: Nurse Practitioner

## 2014-12-27 ENCOUNTER — Other Ambulatory Visit (HOSPITAL_COMMUNITY)
Admission: RE | Admit: 2014-12-27 | Discharge: 2014-12-27 | Disposition: A | Discharge status: Home / Self Care | Payer: 59 | Source: Ambulatory Visit | Attending: Nurse Practitioner | Admitting: Nurse Practitioner

## 2014-12-27 DIAGNOSIS — Z1151 Encounter for screening for human papillomavirus (HPV): Secondary | ICD-10-CM | POA: Diagnosis not present

## 2014-12-27 DIAGNOSIS — Z01411 Encounter for gynecological examination (general) (routine) with abnormal findings: Secondary | ICD-10-CM | POA: Diagnosis present

## 2014-12-28 LAB — CYTOLOGY - PAP

## 2015-05-18 ENCOUNTER — Emergency Department (HOSPITAL_COMMUNITY): Payer: 59

## 2015-05-18 ENCOUNTER — Emergency Department (HOSPITAL_COMMUNITY)
Admission: EM | Admit: 2015-05-18 | Discharge: 2015-05-18 | Disposition: A | Discharge status: Home / Self Care | Payer: 59 | Attending: Emergency Medicine | Admitting: Emergency Medicine

## 2015-05-18 ENCOUNTER — Encounter (HOSPITAL_COMMUNITY): Payer: Self-pay | Admitting: Emergency Medicine

## 2015-05-18 DIAGNOSIS — K2970 Gastritis, unspecified, without bleeding: Secondary | ICD-10-CM

## 2015-05-18 DIAGNOSIS — Z8669 Personal history of other diseases of the nervous system and sense organs: Secondary | ICD-10-CM | POA: Diagnosis not present

## 2015-05-18 DIAGNOSIS — Z3202 Encounter for pregnancy test, result negative: Secondary | ICD-10-CM | POA: Insufficient documentation

## 2015-05-18 DIAGNOSIS — R0789 Other chest pain: Secondary | ICD-10-CM | POA: Diagnosis not present

## 2015-05-18 DIAGNOSIS — Z862 Personal history of diseases of the blood and blood-forming organs and certain disorders involving the immune mechanism: Secondary | ICD-10-CM | POA: Insufficient documentation

## 2015-05-18 DIAGNOSIS — Z79899 Other long term (current) drug therapy: Secondary | ICD-10-CM | POA: Insufficient documentation

## 2015-05-18 DIAGNOSIS — Z9889 Other specified postprocedural states: Secondary | ICD-10-CM | POA: Insufficient documentation

## 2015-05-18 DIAGNOSIS — R079 Chest pain, unspecified: Secondary | ICD-10-CM | POA: Diagnosis present

## 2015-05-18 DIAGNOSIS — Z9851 Tubal ligation status: Secondary | ICD-10-CM | POA: Insufficient documentation

## 2015-05-18 DIAGNOSIS — J45909 Unspecified asthma, uncomplicated: Secondary | ICD-10-CM | POA: Diagnosis not present

## 2015-05-18 DIAGNOSIS — Z87891 Personal history of nicotine dependence: Secondary | ICD-10-CM | POA: Insufficient documentation

## 2015-05-18 LAB — COMPREHENSIVE METABOLIC PANEL
ALT: 19 U/L (ref 14–54)
AST: 23 U/L (ref 15–41)
Albumin: 4.2 g/dL (ref 3.5–5.0)
Alkaline Phosphatase: 50 U/L (ref 38–126)
Anion gap: 11 (ref 5–15)
BUN: 11 mg/dL (ref 6–20)
CO2: 21 mmol/L — ABNORMAL LOW (ref 22–32)
Calcium: 9.1 mg/dL (ref 8.9–10.3)
Chloride: 106 mmol/L (ref 101–111)
Creatinine, Ser: 0.86 mg/dL (ref 0.44–1.00)
GFR calc Af Amer: >60 mL/min (ref >60)
GFR calc non Af Amer: >60 mL/min (ref >60)
Glucose, Bld: 106 mg/dL — ABNORMAL HIGH (ref 65–99)
Potassium: 3.3 mmol/L — ABNORMAL LOW (ref 3.5–5.1)
Sodium: 138 mmol/L (ref 135–145)
Total Bilirubin: 0.5 mg/dL (ref 0.3–1.2)
Total Protein: 8 g/dL (ref 6.5–8.1)

## 2015-05-18 LAB — CBC
HCT: 40.4 % (ref 36.0–46.0)
Hemoglobin: 12.9 g/dL (ref 12.0–15.0)
MCH: 26.5 pg (ref 26.0–34.0)
MCHC: 31.9 g/dL (ref 30.0–36.0)
MCV: 83 fL (ref 78.0–100.0)
Platelets: 304 10*3/uL (ref 150–400)
RBC: 4.87 MIL/uL (ref 3.87–5.11)
RDW: 14.9 % (ref 11.5–15.5)
WBC: 8.8 10*3/uL (ref 4.0–10.5)

## 2015-05-18 LAB — I-STAT BETA HCG BLOOD, ED (MC, WL, AP ONLY): I-stat hCG, quantitative: <5 m[IU]/mL (ref <5)

## 2015-05-18 LAB — I-STAT TROPONIN, ED: Troponin i, poc: 0 ng/mL (ref 0.00–0.08)

## 2015-05-18 LAB — LIPASE, BLOOD: Lipase: 29 U/L (ref 11–51)

## 2015-05-18 MED ORDER — MORPHINE SULFATE (PF) 4 MG/ML IV SOLN
4.0000 mg | Freq: Once | INTRAVENOUS | Status: AC
  Administered 2015-05-18: 4 mg via INTRAVENOUS
  Filled 2015-05-18: qty 1

## 2015-05-18 MED ORDER — PROCHLORPERAZINE EDISYLATE 5 MG/ML IJ SOLN
10.0000 mg | Freq: Once | INTRAMUSCULAR | Status: AC
  Administered 2015-05-18: 10 mg via INTRAVENOUS
  Filled 2015-05-18: qty 2

## 2015-05-18 MED ORDER — PANTOPRAZOLE SODIUM 40 MG IV SOLR
40.0000 mg | Freq: Once | INTRAVENOUS | Status: AC
  Administered 2015-05-18: 40 mg via INTRAVENOUS
  Filled 2015-05-18: qty 40

## 2015-05-18 MED ORDER — ONDANSETRON HCL 4 MG/2ML IJ SOLN
4.0000 mg | Freq: Once | INTRAMUSCULAR | Status: AC
  Administered 2015-05-18: 4 mg via INTRAVENOUS
  Filled 2015-05-18: qty 2

## 2015-05-18 MED ORDER — ONDANSETRON 4 MG PO TBDP
4.0000 mg | ORAL_TABLET | Freq: Once | ORAL | Status: AC | PRN
  Administered 2015-05-18: 4 mg via ORAL

## 2015-05-18 MED ORDER — SODIUM CHLORIDE 0.9 % IV BOLUS (SEPSIS)
1000.0000 mL | Freq: Once | INTRAVENOUS | Status: AC
  Administered 2015-05-18: 1000 mL via INTRAVENOUS

## 2015-05-18 MED ORDER — ONDANSETRON 4 MG PO TBDP
ORAL_TABLET | ORAL | Status: AC
  Filled 2015-05-18: qty 1

## 2015-05-18 MED ORDER — GI COCKTAIL ~~LOC~~
30.0000 mL | Freq: Once | ORAL | Status: AC
  Administered 2015-05-18: 30 mL via ORAL
  Filled 2015-05-18: qty 30

## 2015-05-18 NOTE — Discharge Instructions (Signed)
As discussed, your evaluation today has been largely reassuring.  But, it is important that you monitor your condition carefully, and do not hesitate to return to the ED if you develop new, or concerning changes in your condition. ? ?Otherwise, please follow-up with your physician for appropriate ongoing care. ? ?

## 2015-05-18 NOTE — ED Notes (Signed)
Pt given sprite with GI cocktail for fluid challenge.

## 2015-05-18 NOTE — ED Notes (Signed)
Pt verbalized understanding of d/c instructions and has no further questions. Pt to follow up with GI MD and already has appointment. Pt stable and NAD, d/c home with husband driving.

## 2015-05-18 NOTE — ED Notes (Signed)
Pt here with emesis and burning in chest since this morning. Pt reports coffee ground emesis. Pt has hx of gastritis. Pt tried to take carafate and protonix at home, but vomited it up. Pt took ODT zofran at 1300.

## 2015-05-18 NOTE — ED Provider Notes (Signed)
CSN: [id number]     Arrival date & time 05/18/15  1459 History   First MD Initiated Contact with Patient 05/18/15 1537     Chief Complaint  Patient presents with  . Emesis  . Chest Pain     (Consider location/radiation/quality/duration/timing/severity/associated sxs/prior Treatment) HPI Comments: Patient with history of erosive gastritis presenting with exacerbation of same. Has had nonstop vomiting since about noon today after eating some spicy foods and alcohol last night. She's noticed some "coffee grounds" emesis but it is now clear. denies any diarrhea or fever. Complains of epigastric pain and burning in her chest similar to previous episodes of gastritis. States compliance with Carafate and Protonix. Denies any excessive alcohol or NSAID use. EGD in 06-11-12 showed esophageal erosions. Denies any shortness of breath, cough, fever, back pain, or urinary symptoms. Took zofran without relief.   The history is provided by the patient and a relative. The history is limited by the condition of the patient.    Past Medical History  Diagnosis Date  . Pulse irregularity   . Epilepsy (HCC)     as child; has been on phenobarbital and tegretal in the past, at around 45 yo, had repeat EEG which was normal, and was tapered off seizure medications  . Seasonal allergies   . Asthma     allergy induced, allergic to long haired animals  . Anemia   . Erosive gastritis 03/15/2012  . Family history of breast cancer   . Family history of stomach cancer   . Family history of melanoma    Past Surgical History  Procedure Laterality Date  . Dilation and curettage of uterus  2006/06/12  . Tubal ligation  2000  . Hand surgery  1999-06-12    repair of tendons, nerves after glass laceerated her hand.   Marland Kitchen Hernia repair  age 34    right inguinal.   . Esophagogastroduodenoscopy N/A 03/15/2012    Procedure: ESOPHAGOGASTRODUODENOSCOPY (EGD);  Surgeon: Iva Boop, MD;  Location: [facility] ENDOSCOPY;  Service: Endoscopy;   Laterality: N/A;   Family History  Problem Relation Age of Onset  . Breast cancer Mother 7    passed away in 06/12/2003  . Hypertension Father   . Hyperlipidemia Father   . Asthma Father     heavy smoker  . GER disease Father   . Stomach cancer Maternal Uncle     late 40s-early 35s  . Lung cancer Paternal Uncle     smoker  . Melanoma Maternal Grandmother     dx in her late 19s-early 40s   Social History  Substance Use Topics  . Smoking status: Former Smoker -- 1.00 packs/day for 20 years    Types: Cigarettes    Quit date: 08/08/2011  . Smokeless tobacco: Never Used     Comment: quit jan 2012  . Alcohol Use: Yes     Comment: occasionally    OB History    No data available     Review of Systems  Constitutional: Positive for activity change, appetite change and fatigue. Negative for fever.  HENT: Negative for congestion and rhinorrhea.   Respiratory: Negative for cough, chest tightness and shortness of breath.   Cardiovascular: Positive for chest pain.  Gastrointestinal: Positive for nausea, vomiting and abdominal pain. Negative for diarrhea and constipation.  Genitourinary: Negative for dysuria, hematuria, vaginal bleeding and vaginal discharge.  Musculoskeletal: Negative for myalgias and arthralgias.  Skin: Negative for rash.  Neurological: Negative for dizziness, weakness and headaches.  A complete  10 system review of systems was obtained and all systems are negative except as noted in the HPI and PMH.      Allergies  Sulfa antibiotics  Home Medications   Prior to Admission medications   Medication Sig Start Date End Date Taking? Authorizing Provider  aluminum hydroxide-magnesium carbonate (GAVISCON) 95-358 MG/15ML SUSP Take 15 mLs by mouth 4 (four) times daily - after meals and at bedtime. heartburn   Yes Historical Provider, MD  Multiple Vitamins-Minerals (MULTIVITAMIN WITH MINERALS) tablet Take 1 tablet by mouth daily.   Yes Historical Provider, MD  ondansetron  (ZOFRAN) 4 MG tablet Take 1 tablet (4 mg total) by mouth every 8 (eight) hours as needed for nausea or vomiting. 11/25/14  Yes Jaime Pilcher Ward, PA-C  pantoprazole (PROTONIX) 40 MG tablet Take 1 tablet (40 mg total) by mouth 2 (two) times daily. Patient taking differently: Take 80 mg by mouth daily.  11/18/12  Yes Courtney Paris, MD  Sodium Bicarbonate-Citric Acid (ALKA-SELTZER HEARTBURN PO) Take 2 each by mouth daily as needed (for nausea).   Yes Historical Provider, MD  sucralfate (CARAFATE) 1 G tablet Take 1 tablet (1 g total) by mouth QID. One hour before meals and at bedtime When symptoms are bearable, start taking 1 tablet BID Patient taking differently: Take 1 g by mouth 2 (two) times daily.  11/25/14  Yes Jaime Pilcher Ward, PA-C   BP 104/69 mmHg  Pulse 77  Temp(Src) 98.4 F (36.9 C) (Oral)  Resp 18  Ht  (1.676 m)  Wt 170 lb (77.111 kg)  BMI 27.45 kg/m2  SpO2 100%  LMP 05/04/2015 (Approximate) Physical Exam  Constitutional: She is oriented to person, place, and time. She appears well-developed and well-nourished. She appears distressed.  Actively vomiting  HENT:  Head: Normocephalic and atraumatic.  Mouth/Throat: Oropharynx is clear and moist. No oropharyngeal exudate.  Eyes: Conjunctivae and EOM are normal. Pupils are equal, round, and reactive to light.  Neck: Normal range of motion. Neck supple.  No meningismus.  Cardiovascular: Normal rate, regular rhythm, normal heart sounds and intact distal pulses.   No murmur heard. Pulmonary/Chest: Effort normal and breath sounds normal. No respiratory distress. She exhibits no tenderness.  Abdominal: Soft. There is tenderness. There is no rebound and no guarding.  Epigastric tenderness, no guarding or rebound  Musculoskeletal: Normal range of motion. She exhibits no edema or tenderness.  No CVAT  Neurological: She is alert and oriented to person, place, and time. No cranial nerve deficit. She exhibits normal muscle tone.  Coordination normal.  No ataxia on finger to nose bilaterally. No pronator drift. 5/5 strength throughout. CN 2-12 intact.Equal grip strength. Sensation intact.   Skin: Skin is warm.  Psychiatric: She has a normal mood and affect. Her behavior is normal.  Nursing note and vitals reviewed.   ED Course  Procedures (including critical care time) Labs Review Labs Reviewed  COMPREHENSIVE METABOLIC PANEL - Abnormal; Notable for the following:    Potassium 3.3 (*)    CO2 21 (*)    Glucose, Bld 106 (*)    All other components within normal limits  LIPASE, BLOOD  CBC  URINALYSIS, ROUTINE W REFLEX MICROSCOPIC (NOT AT [facility])  I-STAT BETA HCG BLOOD, ED ([facility], [facility], [facility] ONLY)  I-STAT TROPOININ, ED    Imaging Review Dg Abd Acute W/chest  05/18/2015  CLINICAL DATA:  Vomiting for several hours EXAM: DG ABDOMEN ACUTE W/ 1V CHEST COMPARISON:  11/25/2014 FINDINGS: Cardiac shadow is within normal limits. The  lungs are well aerated bilaterally. No focal infiltrate or sizable effusion is seen. Scattered large and small bowel gas is noted. No obstructive changes are seen. No free air is noted no bony abnormality is seen. IMPRESSION: No acute abnormality noted. Electronically Signed   By: Alcide CleverMark  Lukens M.D.   On: 05/18/2015 17:56   I have personally reviewed and evaluated these images and lab results as part of my medical decision-making.   EKG Interpretation   Date/Time:  Saturday May 18 2015 15:08:25 EDT Ventricular Rate:  105 PR Interval:  122 QRS Duration: 100 QT Interval:  326 QTC Calculation: 430 R Axis:   87 Text Interpretation:  Sinus tachycardia Nonspecific ST abnormality  Abnormal ECG Rate faster Nonspecific ST abnormality Confirmed by Manus GunningANCOUR   MD, STEPHEN [phone]([id number]) on 05/18/2015 3:45:08 PM      MDM   Final diagnoses:  None   Patient with nausea and vomiting in setting of erosive gastritis and possible coffee grounds. Abdomen is soft without peritoneal signs.  Mildly tachycardic.  IVF  given, antiemetics, PPI. Labs reassuring, normal LFTs and lipase. No RUQ pain. AAS will be obtained.  Patient feeling improved after zofran, compazine and protonix.  EKG similar to previous, troponin negative. Low suspicion for ACS. Hemoglobin stable. Low suspicion for active GI bleed.  Will attempt fluid challenge after results of Xray. Patient feeling improved. Care transferred to Dr. Jeraldine LootsLockwood at shift change.  Anticipate discharge home if symptoms improved and tolerating PO.    Glynn OctaveStephen Rancour, MD 05/18/15 [phone]

## 2015-11-14 ENCOUNTER — Other Ambulatory Visit: Payer: Self-pay | Admitting: Nurse Practitioner

## 2015-11-14 DIAGNOSIS — N644 Mastodynia: Secondary | ICD-10-CM

## 2015-11-20 ENCOUNTER — Ambulatory Visit
Admission: RE | Admit: 2015-11-20 | Discharge: 2015-11-20 | Disposition: A | Discharge status: Home / Self Care | Payer: 59 | Source: Ambulatory Visit | Attending: Nurse Practitioner | Admitting: Nurse Practitioner

## 2015-11-20 DIAGNOSIS — N644 Mastodynia: Secondary | ICD-10-CM

## 2016-02-04 DIAGNOSIS — J4521 Mild intermittent asthma with (acute) exacerbation: Secondary | ICD-10-CM | POA: Diagnosis not present

## 2016-02-26 ENCOUNTER — Emergency Department (HOSPITAL_BASED_OUTPATIENT_CLINIC_OR_DEPARTMENT_OTHER): Payer: 59

## 2016-02-26 ENCOUNTER — Encounter (HOSPITAL_BASED_OUTPATIENT_CLINIC_OR_DEPARTMENT_OTHER): Payer: Self-pay | Admitting: *Deleted

## 2016-02-26 ENCOUNTER — Emergency Department (HOSPITAL_BASED_OUTPATIENT_CLINIC_OR_DEPARTMENT_OTHER)
Admission: EM | Admit: 2016-02-26 | Discharge: 2016-02-26 | Disposition: A | Discharge status: Home / Self Care | Payer: 59 | Attending: Emergency Medicine | Admitting: Emergency Medicine

## 2016-02-26 DIAGNOSIS — R002 Palpitations: Secondary | ICD-10-CM | POA: Diagnosis not present

## 2016-02-26 DIAGNOSIS — R42 Dizziness and giddiness: Secondary | ICD-10-CM | POA: Insufficient documentation

## 2016-02-26 DIAGNOSIS — R0789 Other chest pain: Secondary | ICD-10-CM | POA: Diagnosis not present

## 2016-02-26 DIAGNOSIS — Z87891 Personal history of nicotine dependence: Secondary | ICD-10-CM | POA: Diagnosis not present

## 2016-02-26 DIAGNOSIS — R110 Nausea: Secondary | ICD-10-CM | POA: Diagnosis not present

## 2016-02-26 DIAGNOSIS — R51 Headache: Secondary | ICD-10-CM | POA: Diagnosis not present

## 2016-02-26 DIAGNOSIS — R0602 Shortness of breath: Secondary | ICD-10-CM | POA: Diagnosis not present

## 2016-02-26 DIAGNOSIS — J45909 Unspecified asthma, uncomplicated: Secondary | ICD-10-CM | POA: Diagnosis not present

## 2016-02-26 DIAGNOSIS — R079 Chest pain, unspecified: Secondary | ICD-10-CM

## 2016-02-26 DIAGNOSIS — Z79899 Other long term (current) drug therapy: Secondary | ICD-10-CM | POA: Insufficient documentation

## 2016-02-26 LAB — BASIC METABOLIC PANEL
Anion gap: 9 (ref 5–15)
BUN: 24 mg/dL — ABNORMAL HIGH (ref 6–20)
CO2: 24 mmol/L (ref 22–32)
Calcium: 9.4 mg/dL (ref 8.9–10.3)
Chloride: 104 mmol/L (ref 101–111)
Creatinine, Ser: 0.82 mg/dL (ref 0.44–1.00)
GFR calc Af Amer: >60 mL/min (ref >60)
GFR calc non Af Amer: >60 mL/min (ref >60)
Glucose, Bld: 104 mg/dL — ABNORMAL HIGH (ref 65–99)
Potassium: 3.7 mmol/L (ref 3.5–5.1)
Sodium: 137 mmol/L (ref 135–145)

## 2016-02-26 LAB — CBC
HCT: 39.7 % (ref 36.0–46.0)
Hemoglobin: 13.5 g/dL (ref 12.0–15.0)
MCH: 28.1 pg (ref 26.0–34.0)
MCHC: 34 g/dL (ref 30.0–36.0)
MCV: 82.7 fL (ref 78.0–100.0)
Platelets: 273 10*3/uL (ref 150–400)
RBC: 4.8 MIL/uL (ref 3.87–5.11)
RDW: 15 % (ref 11.5–15.5)
WBC: 8.4 10*3/uL (ref 4.0–10.5)

## 2016-02-26 LAB — TROPONIN I
Troponin I: <0.03 ng/mL (ref <0.03)
Troponin I: <0.03 ng/mL (ref <0.03)

## 2016-02-26 MED ORDER — IBUPROFEN 400 MG PO TABS
600.0000 mg | ORAL_TABLET | Freq: Once | ORAL | Status: AC
  Administered 2016-02-26: 600 mg via ORAL
  Filled 2016-02-26: qty 1

## 2016-02-26 MED ORDER — ONDANSETRON 8 MG PO TBDP
8.0000 mg | ORAL_TABLET | Freq: Once | ORAL | Status: AC
  Administered 2016-02-26: 8 mg via ORAL
  Filled 2016-02-26: qty 1

## 2016-02-26 MED ORDER — ACETAMINOPHEN 325 MG PO TABS
650.0000 mg | ORAL_TABLET | Freq: Once | ORAL | Status: AC
  Administered 2016-02-26: 650 mg via ORAL
  Filled 2016-02-26: qty 2

## 2016-02-26 NOTE — ED Triage Notes (Addendum)
Pt c/o sudden onset of chest heaviness, dizziness, nausea , palpitations  X 30 mins, pt states increase stress

## 2016-02-26 NOTE — ED Provider Notes (Signed)
[facility] Provider Note   CSN: [id number] Arrival date & time: 02/26/16  1427     History   Chief Complaint Chief Complaint  Patient presents with  . Chest Pain    HPI Jenny Hudson is a 46 y.o. female with history of erosive gastritis, PVCs, last echo done in 2012 showing 55-65% EF, presents today with chest pressure starting today at 1:30. She reports her chest pressure feels 4/10, left sided, localized, constant. She reports associated dizziness,  nausea, Ha since the morning, significantly increased stress at work and home. She states she had an episode of being cold, clammy, SOB, and near syncopal episode at around 1:30, improved slightly since then but now been persistent. She denies fever, chills, vomiting, diarrhea, changes in urine, changes in bowel movements, abdominal pain. Patient states she takes all her medications that she normally takes. She states she saw Dr. Clide CliffKline, cardiologist, and was told to follow up with PCP and did not need to be seen unless something different happened. She states that she has had similar chest pain before several years ago and was told she had PVCs. She reports having a stress test done showing a normal She has tried ibuprofen for her headache with no relief. She states she woke up with a headache and lasting all day with no relief. She states this a frontal headache that pain gradually got worse and has persisted. She describes it as 8/10 sharp achy persistent pain. She denies any changes in vision or gait. Similar to previous but more persistent. She denies any family history of heart disease. She denies any birth control or estrogen use.  The history is provided by the patient. No language interpreter was used.    Past Medical History:  Diagnosis Date  . Anemia   . 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 Epilepsy (HCC)    as child; has been on phenobarbital and tegretal in the past, at around 46 yo, had  repeat EEG which was normal, and was tapered off seizure medications  . Erosive gastritis 03/15/2012  . Family history of breast cancer   . Family history of melanoma   . Family history of stomach cancer   . Pulse irregularity   . Seasonal allergies     Patient Active Problem List   Diagnosis Date Noted  . Genetic testing 08/14/2014  . Family history of breast cancer   . Family history of stomach cancer   . Family history of melanoma   . Asymptomatic bacteriuria 11/16/2012  . Intractable vomiting 11/16/2012  . Erosive gastritis 03/15/2012  . Abdominal pain, acute, epigastric 03/14/2012  . Nausea and vomiting 03/14/2012  . Dehydration 03/14/2012  . BV (bacterial vaginosis) 03/14/2012  . GERD (gastroesophageal reflux disease) 03/14/2012  . PVC (premature ventricular contraction) 07/23/2010    Past Surgical History:  Procedure Laterality Date  . DILATION AND CURETTAGE OF UTERUS  2008  . ESOPHAGOGASTRODUODENOSCOPY N/A 03/15/2012   Procedure: ESOPHAGOGASTRODUODENOSCOPY (EGD);  Surgeon: Iva Booparl E Gessner, MD;  [facility];  Service: Endoscopy;  Laterality: N/A;  . HAND SURGERY  2001   repair of tendons, nerves after glass laceerated her hand.   Marland Kitchen. HERNIA REPAIR  age 46   right inguinal.   . TUBAL LIGATION  2000    OB History    No data available       Home Medications    Prior to Admission medications   Medication Sig Start Date  End Date Taking? Authorizing Provider  albuterol (PROVENTIL HFA;VENTOLIN HFA) 108 (90 Base) MCG/ACT inhaler Inhale into the lungs every 6 (six) hours as needed for wheezing or shortness of breath.   Yes Historical Provider, MD  aluminum hydroxide-magnesium carbonate (GAVISCON) 95-358 MG/15ML SUSP Take 15 mLs by mouth 4 (four) times daily - after meals and at bedtime. heartburn    Historical Provider, MD  Multiple Vitamins-Minerals (MULTIVITAMIN WITH MINERALS) tablet Take 1 tablet by mouth daily.    Historical Provider, MD  Sodium  Bicarbonate-Citric Acid (ALKA-SELTZER HEARTBURN PO) Take 2 each by mouth daily as needed (for nausea).    Historical Provider, MD    Family History Family History  Problem Relation Age of Onset  . Breast cancer Mother 55    passed away in 2003/05/26  . Hypertension Father   . Hyperlipidemia Father   . Asthma Father     heavy smoker  . GER disease Father   . Stomach cancer Maternal Uncle     late 40s-early 62s  . Lung cancer Paternal Uncle     smoker  . Melanoma Maternal Grandmother     dx in her late 68s-early 43s    Social History Social History  Substance Use Topics  . Smoking status: Former Smoker    Packs/day: 1.00    Years: 20.00    Types: Cigarettes, E-cigarettes    Quit date: 08/08/2011  . Smokeless tobacco: Never Used     Comment: quit jan 2012  . Alcohol use Yes     Comment: occasionally      Allergies   Sulfa antibiotics   Review of Systems Review of Systems  Constitutional: Positive for diaphoresis. Negative for chills and fever.  Respiratory: Positive for shortness of breath.   Cardiovascular: Positive for chest pain and palpitations.  Gastrointestinal: Positive for nausea. Negative for diarrhea and vomiting.  Genitourinary: Negative for difficulty urinating and dysuria.  Neurological: Positive for dizziness and headaches. Negative for speech difficulty.  All other systems reviewed and are negative.    Physical Exam Updated Vital Signs BP 109/70   Pulse 68   Temp 98.3 F (36.8 C)   Resp 16   Ht 5\' 7"  (1.702 m)   Wt 77.1 kg   LMP 02/12/2016   SpO2 99%   BMI 26.63 kg/m   Physical Exam  Constitutional: She is oriented to person, place, and time. She appears well-developed and well-nourished.  Well appearing  HENT:  Head: Normocephalic and atraumatic.  Nose: Nose normal.  Mouth/Throat: Oropharynx is clear and moist.  Eyes: Conjunctivae and EOM are normal.  Neck: Normal range of motion. No JVD present. No tracheal deviation present.    Cardiovascular: Normal rate, normal heart sounds and intact distal pulses.   No murmur heard. Pulmonary/Chest: Effort normal. No respiratory distress. She has no wheezes. She has no rales.  Normal work of breathing. No respiratory distress noted.   Abdominal: Soft. There is no tenderness. There is no rebound and no guarding.  Soft and nontender. No rebound tenderness or guarding. No pulsatile masses noted. Negative Murphy sign. No focal tenderness at McBurney's point.  Musculoskeletal: Normal range of motion.  No tenderness to chest wall.  Neurological: She is alert and oriented to person, place, and time.  Skin: Skin is warm.  Psychiatric: She has a normal mood and affect. Her behavior is normal.  Nursing note and vitals reviewed.    ED Treatments / Results  Labs (all labs ordered are listed, but only abnormal results  are displayed) Labs Reviewed  BASIC METABOLIC PANEL - Abnormal; Notable for the following:       Result Value   Glucose, Bld 104 (*)    BUN 24 (*)    All other components within normal limits  TROPONIN I  CBC  TROPONIN I    EKG  EKG Interpretation  Date/Time:  Wednesday February 26 2016 14:33:17 EST Ventricular Rate:  84 PR Interval:  122 QRS Duration: 98 QT Interval:  376 QTC Calculation: 444 R Axis:   85 Text Interpretation:  Sinus rhythm with occasional Premature ventricular complexes Otherwise normal ECG since previous tracing, rate is slower Confirmed by LITTLE MD, RACHEL [phone]) on 02/26/2016 5:58:49 PM       Radiology Dg Chest 2 View  Result Date: 02/26/2016 CLINICAL DATA:  Sudden onset of chest heaviness and dizziness with nausea and palpitations EXAM: CHEST  2 VIEW COMPARISON:  Chest x-ray of 513 1,017 FINDINGS: No active infiltrate or effusion is seen. Mediastinal and hilar contours are unremarkable. The heart is within normal limits in size. No bony abnormality is seen. IMPRESSION: No active cardiopulmonary disease. Electronically Signed   By:  Dwyane Dee M.D.   On: 02/26/2016 16:28    Procedures Procedures (including critical care time)  Medications Ordered in ED Medications  ondansetron (ZOFRAN-ODT) disintegrating tablet 8 mg (8 mg Oral Given 02/26/16 1637)  acetaminophen (TYLENOL) tablet 650 mg (650 mg Oral Given 02/26/16 1637)     Initial Impression / Assessment and Plan / ED Course  I have reviewed the triage vital signs and the nursing notes.  Pertinent labs & imaging results that were available during my care of the patient were reviewed by me and considered in my medical decision making (see chart for details).   Patient presents with chest heaviness since 1:30pm today at work. Patient does not seem to have any risk factors for ACS or Pe. On exam patient is in no apparent distress, she is afebrile and hemodynamically stable. No JVD present. Heart and lung sounds were clear. No carotid bruits auscultated. No chest wall tenderness on palpation. Lungs are clear to auscultation. Abdomen benign. Lab work so far is reassuring. First troponin is negative. EKG shows known PVCs and otherwise no acute abnormalities. Chest x-ray is negative for any cardiopulmonary disease.  On reassessment patient states that her chest heaviness has resolved as well as her nausea. She continues to have headache. Case discussed with Dr. Clarene Duke.   Awaiting second troponin.  At shift change care was transferred to Mathews Robinsons, PA-C who will follow pending studies, re-evaulate and determine disposition.     Final Clinical Impressions(s) / ED Diagnoses   Final diagnoses:  None    New Prescriptions New Prescriptions   No medications on file     [address] [place], [place] 02/26/16 1843    Laurence Spates, MD 02/29/16 [phone]

## 2016-02-26 NOTE — ED Provider Notes (Signed)
[facility] pending second troponin and ready for discharge if negative. Patient had been discussed with Dr. Clarene DukeLittle who agrees with assessment and plan. The patient appeared stable for discharge and no acute distress. Confirmed with patient that she did not have any risk factors for PE, no estrogen use, recent immobilization, surgery, swelling in her calves, pain in her calf, history of DVT or PE, hemoptysis, tachycardia, hypoxia, shortness of breath.    [place] ShoreJessica B Mitchell, PA-C 02/26/16 2000    Laurence Spatesachel Morgan Little, MD 02/29/16 [phone]

## 2016-02-26 NOTE — ED Notes (Signed)
Pt verbalizes understanding of d/c instructions and denies any further needs at this time. 

## 2016-02-26 NOTE — Discharge Instructions (Signed)
Please return to the emergency department if you experience worsening chest pain, shortness of breath, lightheadedness or any other concerning symptoms.  Follow up with your primary care provider regarding today's visit.

## 2016-03-06 DIAGNOSIS — M1712 Unilateral primary osteoarthritis, left knee: Secondary | ICD-10-CM | POA: Diagnosis not present

## 2016-03-06 DIAGNOSIS — M25562 Pain in left knee: Secondary | ICD-10-CM | POA: Diagnosis not present

## 2016-04-28 DIAGNOSIS — J301 Allergic rhinitis due to pollen: Secondary | ICD-10-CM | POA: Diagnosis not present

## 2016-04-30 DIAGNOSIS — J020 Streptococcal pharyngitis: Secondary | ICD-10-CM | POA: Diagnosis not present

## 2016-04-30 DIAGNOSIS — R05 Cough: Secondary | ICD-10-CM | POA: Diagnosis not present

## 2016-05-11 DIAGNOSIS — J029 Acute pharyngitis, unspecified: Secondary | ICD-10-CM | POA: Diagnosis not present

## 2016-05-11 DIAGNOSIS — J301 Allergic rhinitis due to pollen: Secondary | ICD-10-CM | POA: Diagnosis not present

## 2016-08-24 DIAGNOSIS — R5383 Other fatigue: Secondary | ICD-10-CM | POA: Diagnosis not present

## 2016-08-24 DIAGNOSIS — J302 Other seasonal allergic rhinitis: Secondary | ICD-10-CM | POA: Diagnosis not present

## 2016-08-24 DIAGNOSIS — J4530 Mild persistent asthma, uncomplicated: Secondary | ICD-10-CM | POA: Diagnosis not present

## 2016-08-24 DIAGNOSIS — Z0000 Encounter for general adult medical examination without abnormal findings: Secondary | ICD-10-CM | POA: Diagnosis not present

## 2016-08-24 DIAGNOSIS — Z23 Encounter for immunization: Secondary | ICD-10-CM | POA: Diagnosis not present

## 2016-09-09 DIAGNOSIS — D649 Anemia, unspecified: Secondary | ICD-10-CM | POA: Diagnosis not present

## 2016-11-02 DIAGNOSIS — S60512A Abrasion of left hand, initial encounter: Secondary | ICD-10-CM | POA: Diagnosis not present

## 2016-11-02 DIAGNOSIS — M25532 Pain in left wrist: Secondary | ICD-10-CM | POA: Diagnosis not present

## 2016-11-02 DIAGNOSIS — M79642 Pain in left hand: Secondary | ICD-10-CM | POA: Diagnosis not present

## 2016-11-03 DIAGNOSIS — S63502A Unspecified sprain of left wrist, initial encounter: Secondary | ICD-10-CM | POA: Diagnosis not present

## 2016-11-05 DIAGNOSIS — S63502D Unspecified sprain of left wrist, subsequent encounter: Secondary | ICD-10-CM | POA: Diagnosis not present

## 2016-11-13 DIAGNOSIS — M25532 Pain in left wrist: Secondary | ICD-10-CM | POA: Diagnosis not present

## 2016-11-18 DIAGNOSIS — M25532 Pain in left wrist: Secondary | ICD-10-CM | POA: Diagnosis not present

## 2017-02-08 DIAGNOSIS — R05 Cough: Secondary | ICD-10-CM | POA: Diagnosis not present

## 2017-02-08 DIAGNOSIS — J018 Other acute sinusitis: Secondary | ICD-10-CM | POA: Diagnosis not present

## 2017-05-10 ENCOUNTER — Other Ambulatory Visit: Payer: Self-pay | Admitting: Nurse Practitioner

## 2017-05-10 DIAGNOSIS — Z1231 Encounter for screening mammogram for malignant neoplasm of breast: Secondary | ICD-10-CM

## 2017-05-23 HISTORY — PX: DENTAL SURGERY: SHX609

## 2017-05-27 ENCOUNTER — Emergency Department (HOSPITAL_BASED_OUTPATIENT_CLINIC_OR_DEPARTMENT_OTHER)
Admission: EM | Admit: 2017-05-27 | Discharge: 2017-05-27 | Disposition: A | Discharge status: Home / Self Care | Payer: 59 | Attending: Emergency Medicine | Admitting: Emergency Medicine

## 2017-05-27 ENCOUNTER — Other Ambulatory Visit: Payer: Self-pay

## 2017-05-27 ENCOUNTER — Emergency Department (HOSPITAL_BASED_OUTPATIENT_CLINIC_OR_DEPARTMENT_OTHER): Payer: 59

## 2017-05-27 ENCOUNTER — Encounter (HOSPITAL_BASED_OUTPATIENT_CLINIC_OR_DEPARTMENT_OTHER): Payer: Self-pay | Admitting: Emergency Medicine

## 2017-05-27 DIAGNOSIS — M7989 Other specified soft tissue disorders: Secondary | ICD-10-CM | POA: Insufficient documentation

## 2017-05-27 DIAGNOSIS — Z87891 Personal history of nicotine dependence: Secondary | ICD-10-CM | POA: Insufficient documentation

## 2017-05-27 DIAGNOSIS — R109 Unspecified abdominal pain: Secondary | ICD-10-CM | POA: Diagnosis not present

## 2017-05-27 DIAGNOSIS — J45909 Unspecified asthma, uncomplicated: Secondary | ICD-10-CM | POA: Insufficient documentation

## 2017-05-27 DIAGNOSIS — Z79899 Other long term (current) drug therapy: Secondary | ICD-10-CM | POA: Diagnosis not present

## 2017-05-27 DIAGNOSIS — R1011 Right upper quadrant pain: Secondary | ICD-10-CM | POA: Diagnosis not present

## 2017-05-27 LAB — COMPREHENSIVE METABOLIC PANEL
ALT: 18 U/L (ref 14–54)
AST: 25 U/L (ref 15–41)
Albumin: 4 g/dL (ref 3.5–5.0)
Alkaline Phosphatase: 44 U/L (ref 38–126)
Anion gap: 10 (ref 5–15)
BUN: 13 mg/dL (ref 6–20)
CO2: 21 mmol/L — ABNORMAL LOW (ref 22–32)
Calcium: 8.9 mg/dL (ref 8.9–10.3)
Chloride: 108 mmol/L (ref 101–111)
Creatinine, Ser: 0.8 mg/dL (ref 0.44–1.00)
GFR calc Af Amer: >60 mL/min (ref >60)
GFR calc non Af Amer: >60 mL/min (ref >60)
Glucose, Bld: 92 mg/dL (ref 65–99)
Potassium: 3.7 mmol/L (ref 3.5–5.1)
Sodium: 139 mmol/L (ref 135–145)
Total Bilirubin: 0.4 mg/dL (ref 0.3–1.2)
Total Protein: 7 g/dL (ref 6.5–8.1)

## 2017-05-27 LAB — CBC WITH DIFFERENTIAL/PLATELET
Basophils Absolute: 0 10*3/uL (ref 0.0–0.1)
Basophils Relative: 1 %
Eosinophils Absolute: 0.2 10*3/uL (ref 0.0–0.7)
Eosinophils Relative: 4 %
HCT: 32.5 % — ABNORMAL LOW (ref 36.0–46.0)
Hemoglobin: 11.2 g/dL — ABNORMAL LOW (ref 12.0–15.0)
Lymphocytes Relative: 35 %
Lymphs Abs: 1.9 10*3/uL (ref 0.7–4.0)
MCH: 28.8 pg (ref 26.0–34.0)
MCHC: 34.5 g/dL (ref 30.0–36.0)
MCV: 83.5 fL (ref 78.0–100.0)
Monocytes Absolute: 0.6 10*3/uL (ref 0.1–1.0)
Monocytes Relative: 11 %
Neutro Abs: 2.7 10*3/uL (ref 1.7–7.7)
Neutrophils Relative %: 49 %
Platelets: 250 10*3/uL (ref 150–400)
RBC: 3.89 MIL/uL (ref 3.87–5.11)
RDW: 14.7 % (ref 11.5–15.5)
WBC: 5.3 10*3/uL (ref 4.0–10.5)

## 2017-05-27 LAB — URINALYSIS, ROUTINE W REFLEX MICROSCOPIC
Bilirubin Urine: NEGATIVE
Glucose, UA: NEGATIVE mg/dL
Hgb urine dipstick: NEGATIVE
Ketones, ur: 40 mg/dL — AB
Leukocytes, UA: NEGATIVE
Nitrite: NEGATIVE
Protein, ur: NEGATIVE mg/dL
Specific Gravity, Urine: 1.015 (ref 1.005–1.030)
pH: 8 (ref 5.0–8.0)

## 2017-05-27 LAB — LIPASE, BLOOD: Lipase: 29 U/L (ref 11–51)

## 2017-05-27 LAB — D-DIMER, QUANTITATIVE: D-Dimer, Quant: 0.47 ug/mL-FEU (ref 0.00–0.50)

## 2017-05-27 LAB — PREGNANCY, URINE: Preg Test, Ur: NEGATIVE

## 2017-05-27 MED ORDER — DOCUSATE SODIUM 100 MG PO CAPS: 100.0000 mg | ORAL_CAPSULE | Freq: Two times a day (BID) | ORAL | 0 refills | Status: DC

## 2017-05-27 MED ORDER — ORPHENADRINE CITRATE ER 100 MG PO TB12: 100.0000 mg | ORAL_TABLET | Freq: Two times a day (BID) | ORAL | 0 refills | Status: DC

## 2017-05-27 MED ORDER — ONDANSETRON HCL 4 MG/2ML IJ SOLN
INTRAMUSCULAR | Status: AC
  Filled 2017-05-27: qty 2

## 2017-05-27 MED ORDER — HYDROCODONE-ACETAMINOPHEN 5-325 MG PO TABS: 1.0000 | ORAL_TABLET | Freq: Four times a day (QID) | ORAL | 0 refills | Status: DC | PRN

## 2017-05-27 MED ORDER — ONDANSETRON HCL 4 MG/2ML IJ SOLN
4.0000 mg | Freq: Once | INTRAMUSCULAR | Status: AC
  Administered 2017-05-27: 4 mg via INTRAVENOUS

## 2017-05-27 MED ORDER — SODIUM CHLORIDE 0.9 % IV BOLUS (SEPSIS)
1000.0000 mL | Freq: Once | INTRAVENOUS | Status: AC
  Administered 2017-05-27: 1000 mL via INTRAVENOUS

## 2017-05-27 MED ORDER — KETOROLAC TROMETHAMINE 30 MG/ML IJ SOLN
30.0000 mg | Freq: Once | INTRAMUSCULAR | Status: AC
  Administered 2017-05-27: 30 mg via INTRAVENOUS
  Filled 2017-05-27: qty 1

## 2017-05-27 MED ORDER — IOPAMIDOL (ISOVUE-300) INJECTION 61%
100.0000 mL | Freq: Once | INTRAVENOUS | Status: AC | PRN
  Administered 2017-05-27: 100 mL via INTRAVENOUS

## 2017-05-27 MED ORDER — IOPAMIDOL (ISOVUE-300) INJECTION 61%
30.0000 mL | Freq: Once | INTRAVENOUS | Status: AC | PRN
  Administered 2017-05-27: 15 mL via ORAL

## 2017-05-27 MED ORDER — HYDROMORPHONE HCL 1 MG/ML IJ SOLN
1.0000 mg | Freq: Once | INTRAMUSCULAR | Status: AC
  Administered 2017-05-27: 1 mg via INTRAVENOUS

## 2017-05-27 MED ORDER — SODIUM CHLORIDE 0.9 % IV SOLN
1000.0000 mL | INTRAVENOUS | Status: DC
  Administered 2017-05-27: 1000 mL via INTRAVENOUS

## 2017-05-27 MED ORDER — HYDROMORPHONE HCL 1 MG/ML IJ SOLN
INTRAMUSCULAR | Status: AC
  Filled 2017-05-27: qty 1

## 2017-05-27 MED ORDER — NAPROXEN 500 MG PO TABS: 500.0000 mg | ORAL_TABLET | Freq: Two times a day (BID) | ORAL | 0 refills | Status: DC

## 2017-05-27 MED FILL — HYDROCODON-APAP 5-325: 5-325 | 3 days supply | Qty: 20 | Fill #0

## 2017-05-27 MED FILL — ORPHENADRINE 100 MG TAB SA: 100 | 15 days supply | Qty: 30 | Fill #0

## 2017-05-27 MED FILL — NAPROXEN 500 MG TABLET: 500 | 15 days supply | Qty: 30 | Fill #0

## 2017-05-27 MED FILL — DOK 100 MG SOFTGEL: 100 | 50 days supply | Qty: 100 | Fill #0

## 2017-05-27 NOTE — ED Triage Notes (Addendum)
RUQ pain since Sunday after working out, worse over past 24hrs , pain radiates to side. Also has noticed that has swelling to right ankle this and decrease in urination , nausea as well this am

## 2017-05-27 NOTE — ED Notes (Signed)
ED Provider at bedside. 

## 2017-05-27 NOTE — ED Provider Notes (Signed)
[facility] EMERGENCY DEPARTMENT Provider Note   CSN: [id number] Arrival date & time: 05/27/17  4098     History   Chief Complaint Chief Complaint  Patient presents with  . Abdominal Pain    HPI Jenny Hudson is a 47 y.o. female.  HPI Patient has started a new workout.  She was working out on Sunday and felt a mild discomfort on her right lateral quadrant.  She denies any severe pain at that time or sensation of an injury.  She reports that she got worsening pain over the a couple of days still thought it was probably a strain.  She reports today however the pain became severe and sharp and debilitating.  She reports that it hurts to take a deep breath to cough or to move.  Pain is radiating from the right side to the right mid abdomen.  She reports that it seems like the area has become swollen.  He also notes that her right ankle seem to do some swelling.  History of PE or DVT.  No history of gallbladder disease.  No fever.  She did feel somewhat nauseated when the pain became severe but no vomiting. Past Medical History:  Diagnosis Date  . Anemia   . 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 Epilepsy (HCC)    as child; has been on phenobarbital and tegretal in the past, at around 47 yo, had repeat EEG which was normal, and was tapered off seizure medications  . Erosive gastritis 03/15/2012  . Family history of breast cancer   . Family history of melanoma   . Family history of stomach cancer   . Pulse irregularity   . Seasonal allergies     Patient Active Problem List   Diagnosis Date Noted  . Genetic testing 08/14/2014  . Family history of breast cancer   . Family history of stomach cancer   . Family history of melanoma   . Asymptomatic bacteriuria 11/16/2012  . Intractable vomiting 11/16/2012  . Erosive gastritis 03/15/2012  . Abdominal pain, acute, epigastric 03/14/2012  . Nausea and vomiting 03/14/2012  . Dehydration 03/14/2012  .  BV (bacterial vaginosis) 03/14/2012  . GERD (gastroesophageal reflux disease) 03/14/2012  . PVC (premature ventricular contraction) 07/23/2010    Past Surgical History:  Procedure Laterality Date  . DENTAL SURGERY  05/23/2017  . DILATION AND CURETTAGE OF UTERUS  2008  . ESOPHAGOGASTRODUODENOSCOPY N/A 03/15/2012   Procedure: ESOPHAGOGASTRODUODENOSCOPY (EGD);  Surgeon: Iva Boop, MD;  Location: [facility] ENDOSCOPY;  Service: Endoscopy;  Laterality: N/A;  . HAND SURGERY  2001   repair of tendons, nerves after glass laceerated her hand.   Marland Kitchen HERNIA REPAIR  age 56   right inguinal.   . TUBAL LIGATION  2000     OB History   None      Home Medications    Prior to Admission medications   Medication Sig Start Date End Date Taking? Authorizing Provider  albuterol (PROVENTIL HFA;VENTOLIN HFA) 108 (90 Base) MCG/ACT inhaler Inhale into the lungs every 6 (six) hours as needed for wheezing or shortness of breath.    [provider]  aluminum hydroxide-magnesium carbonate (GAVISCON) 95-358 MG/15ML SUSP Take 15 mLs by mouth 4 (four) times daily - after meals and at bedtime. heartburn    [provider]  amoxicillin (AMOXIL) 500 MG capsule TAKE ONE CAPSULE BY MOUTH 4 TIMES A DAY FOR 7 DAYS 05/24/17   [provider]  docusate sodium (COLACE) 100 MG capsule Take 1 capsule (100 mg total) by mouth every 12 (twelve) hours. 05/27/17   Arby Barrette, MD  HYDROcodone-acetaminophen (NORCO/VICODIN) 5-325 MG tablet TAKE 1 TABLET BY MOUTH EVERY 4-6 HOURS AS NEEDED FOR PAIN 05/24/17   [provider]  HYDROcodone-acetaminophen (NORCO/VICODIN) 5-325 MG tablet Take 1-2 tablets by mouth every 6 (six) hours as needed for moderate pain or severe pain. 05/27/17   Arby Barrette, MD  ibuprofen (ADVIL,MOTRIN) 600 MG tablet TAKE 1 TABLET BY MOUTH FOUR TIMES A DAY FOR 5 DAYS THEN EVERY 6 HOURS IF NEEDED 05/24/17   [provider]  montelukast (SINGULAIR) 10 MG tablet Take 10 mg by  mouth at bedtime. 04/28/17   [provider]  Multiple Vitamins-Minerals (MULTIVITAMIN WITH MINERALS) tablet Take 1 tablet by mouth daily.    [provider]  naproxen (NAPROSYN) 500 MG tablet Take 1 tablet (500 mg total) by mouth 2 (two) times daily. 05/27/17   Arby Barrette, MD  orphenadrine (NORFLEX) 100 MG tablet Take 1 tablet (100 mg total) by mouth 2 (two) times daily. 05/27/17   Arby Barrette, MD  phentermine (ADIPEX-P) 37.5 MG tablet Take 37.5 mg by mouth daily. 05/11/17   [provider]  Sodium Bicarbonate-Citric Acid (ALKA-SELTZER HEARTBURN PO) Take 2 each by mouth daily as needed (for nausea).    [provider]  triazolam (HALCION) 0.25 MG tablet PLEASE SEE ATTACHED FOR DETAILED DIRECTIONS 05/21/17   [provider]    Family History Family History  Problem Relation Age of Onset  . Breast cancer Mother 21       passed away in 14-May-2003  . Hypertension Father   . Hyperlipidemia Father   . Asthma Father        heavy smoker  . GER disease Father   . Stomach cancer Maternal Uncle        late 40s-early 67s  . Lung cancer Paternal Uncle        smoker  . Melanoma Maternal Grandmother        dx in her late 58s-early 25s    Social History Social History   Tobacco Use  . Smoking status: Former Smoker    Packs/day: 1.00    Years: 20.00    Pack years: 20.00    Types: Cigarettes, E-cigarettes    Last attempt to quit: 08/08/2011    Years since quitting: 5.8  . Smokeless tobacco: Never Used  . Tobacco comment: quit jan 2012  Substance Use Topics  . Alcohol use: Yes    Comment: occasionally   . Drug use: No     Allergies   Sulfa antibiotics   Review of Systems Review of Systems 10 Systems reviewed and are negative for acute change except as noted in the HPI.   Physical Exam Updated Vital Signs BP 123/79   Pulse 60   Temp 97.9 F (36.6 C) (Oral)   Resp 16   Ht  (1.702 m)   Wt 78.5 kg (173 lb)   LMP 05/17/2017    SpO2 100%   BMI 27.10 kg/m   Physical Exam  Constitutional: She is oriented to person, place, and time. She appears well-developed and well-nourished.  Patient appears to be in severe pain.  She is holding her right side and slightly tearful.  Patient is well-nourished well-developed.  Clinically well appearance.  Nontoxic.  HENT:  Head: Normocephalic and atraumatic.  Mouth/Throat: Oropharynx is clear and moist.  Eyes: Pupils are equal, round, and  reactive to light. EOM are normal.  Neck: Neck supple.  Cardiovascular: Normal rate, regular rhythm, normal heart sounds and intact distal pulses.  Pulmonary/Chest: Effort normal and breath sounds normal. She exhibits tenderness.  Tenderness to the right lateral chest wall along the ribs approximately 5 through 9.  No palpable anomaly.  No crepitus.  Abdominal: Soft. Bowel sounds are normal. She exhibits no distension. There is tenderness.  Patient has severe pain to palpation in the right upper quadrant.  Lower quadrants are nontender.  Musculoskeletal: Normal range of motion. She exhibits no edema or tenderness.  No calf tenderness.  Questionable subtle edema of the right ankle relative to the left.  Neurological: She is alert and oriented to person, place, and time. She has normal strength. She exhibits normal muscle tone. Coordination normal. GCS eye subscore is 4. GCS verbal subscore is 5. GCS motor subscore is 6.  Skin: Skin is warm, dry and intact.  Psychiatric: She has a normal mood and affect.     ED Treatments / Results  Labs (all labs ordered are listed, but only abnormal results are displayed) Labs Reviewed  CBC WITH DIFFERENTIAL/PLATELET - Abnormal; Notable for the following components:      Result Value   Hemoglobin 11.2 (*)    HCT 32.5 (*)    All other components within normal limits  COMPREHENSIVE METABOLIC PANEL - Abnormal; Notable for the following components:   CO2 21 (*)    All other components within normal limits    URINALYSIS, ROUTINE W REFLEX MICROSCOPIC - Abnormal; Notable for the following components:   Ketones, ur 40 (*)    All other components within normal limits  LIPASE, BLOOD  PREGNANCY, URINE  D-DIMER, QUANTITATIVE (NOT AT [facility])    EKG None  Radiology Dg Chest 2 View  Result Date: 05/27/2017 CLINICAL DATA:  Right upper quadrant pain. Possible muscle injury while at the gym. EXAM: CHEST - 2 VIEW COMPARISON:  Chest x-ray dated February 26, 2016. FINDINGS: The heart size and mediastinal contours are within normal limits. Both lungs are clear. The visualized skeletal structures are unremarkable. IMPRESSION: No active cardiopulmonary disease. Electronically Signed   By: Obie Dredge M.D.   On: 05/27/2017 11:21   Ct Abdomen Pelvis W Contrast  Result Date: 05/27/2017 CLINICAL DATA:  47 year old female with acute RIGHT abdominal and pelvic pain for 4 days. EXAM: CT ABDOMEN AND PELVIS WITH CONTRAST TECHNIQUE: Multidetector CT imaging of the abdomen and pelvis was performed using the standard protocol following bolus administration of intravenous contrast. CONTRAST:  100 cc intravenous Isovue-300 COMPARISON:  11/25/2014 CT.  05/27/2017 ultrasound. FINDINGS: Lower chest: No acute abnormality Hepatobiliary: The liver and gallbladder are unremarkable. No biliary dilatation. Pancreas: Unremarkable Spleen: Unremarkable Adrenals/Urinary Tract: The kidneys, adrenal glands and bladder are unremarkable. Stomach/Bowel: Stomach is within normal limits. Appendix appears normal. No evidence of bowel wall thickening, distention, or inflammatory changes. A moderate amount of stool throughout the colon noted. Vascular/Lymphatic: No significant vascular findings are present. No enlarged abdominal or pelvic lymph nodes. Reproductive: Uterus and bilateral adnexa are unremarkable. Other: No ascites, abscess or pneumoperitoneum. Musculoskeletal: No acute or significant osseous findings. IMPRESSION: 1. No acute abnormalities.  2. Moderate colonic stool. Electronically Signed   By: Harmon Pier M.D.   On: 05/27/2017 13:50   US Abdomen Limited  Result Date: 05/27/2017 CLINICAL DATA:  Right upper quadrant pain with nausea EXAM: ULTRASOUND ABDOMEN LIMITED RIGHT UPPER QUADRANT COMPARISON:  CT abdomen and pelvis November 25, 2014 FINDINGS: Gallbladder: No  gallstones or wall thickening visualized. There is no pericholecystic fluid. No sonographic Murphy sign noted by sonographer. Common bile duct: Diameter: 3 mm. No intrahepatic or extrahepatic biliary duct dilatation. Liver: No focal lesion identified. Within normal limits in parenchymal echogenicity. Portal vein is patent on color Doppler imaging with normal direction of blood flow towards the liver. IMPRESSION: Study within normal limits. Note that a small probable cyst in the posterior segment right lobe of the liver seen on prior CT is not appreciable on current ultrasound examination. Electronically Signed   By: Bretta Bang III M.D.   On: 05/27/2017 10:39    Procedures Procedures (including critical care time) No critical care time. Medications Ordered in ED Medications  sodium chloride 0.9 % bolus 1,000 mL (0 mLs Intravenous Stopped 05/27/17 0937)    Followed by  0.9 %  sodium chloride infusion (0 mLs Intravenous Stopped 05/27/17 1410)  HYDROmorphone (DILAUDID) injection 1 mg (1 mg Intravenous Given 05/27/17 0903)  ondansetron (ZOFRAN) injection 4 mg (4 mg Intravenous Given 05/27/17 0901)  ketorolac (TORADOL) 30 MG/ML injection 30 mg (30 mg Intravenous Given 05/27/17 1138)  iopamidol (ISOVUE-300) 61 % injection 30 mL (15 mLs Oral Contrast Given 05/27/17 1319)  iopamidol (ISOVUE-300) 61 % injection 100 mL (100 mLs Intravenous Contrast Given 05/27/17 1320)     Initial Impression / Assessment and Plan / ED Course  I have reviewed the triage vital signs and the nursing notes.  Pertinent labs & imaging results that were available during my care of the patient were  reviewed by me and considered in my medical decision making (see chart for details).      Final Clinical Impressions(s) / ED Diagnoses   Final diagnoses:  Right upper quadrant abdominal pain   Patient presents with severe right lower thoracic right upper quadrant pain.  Diagnosis included biliary colic, PE,  chest wall strain, pneumonia.  Patient required immediate pain control.  Once  pain controlled, further assessment completed.  Right upper quadrant ultrasound shows no cholecystitis or gallstones, biliary colic ruled out.  D-dimer not elevated.  She does not have hypoxia or tachypnea to suggest PE.  CT scan shows no kidney stone or other renal, hepatic abnormality.  At this time this appears most likely to be a chest wall\abdominal muscular wall strain.  Patient will be treated for pain.  Return precautions are reviewed. ED Discharge Orders        Ordered    naproxen (NAPROSYN) 500 MG tablet  2 times daily     05/27/17 1401    orphenadrine (NORFLEX) 100 MG tablet  2 times daily     05/27/17 1401    HYDROcodone-acetaminophen (NORCO/VICODIN) 5-325 MG tablet  Every 6 hours PRN     05/27/17 1401    docusate sodium (COLACE) 100 MG capsule  Every 12 hours     05/27/17 1401       Arby Barrette, MD 05/27/17 1423

## 2017-06-01 ENCOUNTER — Ambulatory Visit
Admission: RE | Admit: 2017-06-01 | Discharge: 2017-06-01 | Disposition: A | Discharge status: Home / Self Care | Payer: 59 | Source: Ambulatory Visit | Attending: Nurse Practitioner | Admitting: Nurse Practitioner

## 2017-06-01 DIAGNOSIS — Z1231 Encounter for screening mammogram for malignant neoplasm of breast: Secondary | ICD-10-CM

## 2017-06-01 DIAGNOSIS — R109 Unspecified abdominal pain: Secondary | ICD-10-CM | POA: Diagnosis not present

## 2017-06-01 DIAGNOSIS — M25473 Effusion, unspecified ankle: Secondary | ICD-10-CM | POA: Diagnosis not present

## 2018-01-11 DIAGNOSIS — J069 Acute upper respiratory infection, unspecified: Secondary | ICD-10-CM | POA: Diagnosis not present

## 2018-01-18 DIAGNOSIS — H52223 Regular astigmatism, bilateral: Secondary | ICD-10-CM | POA: Diagnosis not present

## 2018-03-28 DIAGNOSIS — K1370 Unspecified lesions of oral mucosa: Secondary | ICD-10-CM | POA: Diagnosis not present

## 2018-06-01 DIAGNOSIS — T783XXA Angioneurotic edema, initial encounter: Secondary | ICD-10-CM | POA: Diagnosis not present

## 2019-03-01 ENCOUNTER — Other Ambulatory Visit: Payer: Self-pay | Admitting: Nurse Practitioner

## 2019-03-01 DIAGNOSIS — Z1231 Encounter for screening mammogram for malignant neoplasm of breast: Secondary | ICD-10-CM

## 2019-03-02 ENCOUNTER — Ambulatory Visit: Payer: 59

## 2019-03-27 ENCOUNTER — Emergency Department (HOSPITAL_COMMUNITY): Payer: 59

## 2019-03-27 ENCOUNTER — Emergency Department (HOSPITAL_COMMUNITY)
Admission: EM | Admit: 2019-03-27 | Discharge: 2019-03-27 | Disposition: A | Discharge status: Home / Self Care | Payer: 59 | Attending: Emergency Medicine | Admitting: Emergency Medicine

## 2019-03-27 ENCOUNTER — Other Ambulatory Visit: Payer: Self-pay

## 2019-03-27 ENCOUNTER — Encounter (HOSPITAL_COMMUNITY): Payer: Self-pay | Admitting: Emergency Medicine

## 2019-03-27 DIAGNOSIS — R002 Palpitations: Secondary | ICD-10-CM | POA: Diagnosis not present

## 2019-03-27 DIAGNOSIS — Z79899 Other long term (current) drug therapy: Secondary | ICD-10-CM | POA: Diagnosis not present

## 2019-03-27 DIAGNOSIS — Z87891 Personal history of nicotine dependence: Secondary | ICD-10-CM | POA: Diagnosis not present

## 2019-03-27 DIAGNOSIS — R000 Tachycardia, unspecified: Secondary | ICD-10-CM | POA: Diagnosis present

## 2019-03-27 LAB — BASIC METABOLIC PANEL
Anion gap: 9 (ref 5–15)
BUN: 21 mg/dL — ABNORMAL HIGH (ref 6–20)
CO2: 23 mmol/L (ref 22–32)
Calcium: 9.2 mg/dL (ref 8.9–10.3)
Chloride: 106 mmol/L (ref 98–111)
Creatinine, Ser: 0.82 mg/dL (ref 0.44–1.00)
GFR calc Af Amer: >60 mL/min (ref >60)
GFR calc non Af Amer: >60 mL/min (ref >60)
Glucose, Bld: 99 mg/dL (ref 70–99)
Potassium: 3.8 mmol/L (ref 3.5–5.1)
Sodium: 138 mmol/L (ref 135–145)

## 2019-03-27 LAB — CBC
HCT: 38.2 % (ref 36.0–46.0)
Hemoglobin: 12 g/dL (ref 12.0–15.0)
MCH: 26 pg (ref 26.0–34.0)
MCHC: 31.4 g/dL (ref 30.0–36.0)
MCV: 82.9 fL (ref 80.0–100.0)
Platelets: 284 10*3/uL (ref 150–400)
RBC: 4.61 MIL/uL (ref 3.87–5.11)
RDW: 15.8 % — ABNORMAL HIGH (ref 11.5–15.5)
WBC: 5.7 10*3/uL (ref 4.0–10.5)
nRBC: 0 % (ref 0.0–0.2)

## 2019-03-27 LAB — TROPONIN I (HIGH SENSITIVITY)
Troponin I (High Sensitivity): 6 ng/L (ref <18)
Troponin I (High Sensitivity): 6 ng/L (ref <18)

## 2019-03-27 MED ORDER — SODIUM CHLORIDE 0.9% FLUSH: 3.0000 mL | Freq: Once | INTRAVENOUS | Status: DC

## 2019-03-27 NOTE — ED Triage Notes (Signed)
Pt here from home with c/o chest pain and sob , her heart rate has been going up to the 160's in the morning but coming back down ., pt has been on lopressor in the past for tachycardia

## 2019-03-27 NOTE — ED Provider Notes (Signed)
[facility] DEPARTMENT Provider Note   CSN: [id number] Arrival date & time: 03/27/19  1422     History No chief complaint on file.   Jenny Hudson is a 49 y.o. female.  HPI She presents for evaluation of intermittent periods of tachycardia with lightheadedness.  Onset several days ago, sporadic, no specific etiology.  Occurs both when standing and moving.  No syncope.  No chest pain or shortness of breath associated.  History of same about 10 years ago and was on a beta-blocker for a year, used as needed periods of tachyarrhythmias.  At that time she had a cardiac stress test which was normal.  She has not had any Holter monitoring by her report.  She denies current illness including fever, chills, vomiting, dizziness, change in bowel or urinary habits.  She is recovering from a Covid infection, greater than 3 months ago and is back at work.  There are no other known modifying factors.    Past Medical History:  Diagnosis Date  . Anemia   . 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 Epilepsy ([place])    as child; has been on phenobarbital and tegretal in the past, at around 49 yo, had repeat EEG which was normal, and was tapered off seizure medications  . Erosive gastritis 03/15/2012  . Family history of breast cancer   . Family history of melanoma   . Family history of stomach cancer   . Pulse irregularity   . Seasonal allergies     Patient Active Problem List   Diagnosis Date Noted  . Genetic testing 08/14/2014  . Family history of breast cancer   . Family history of stomach cancer   . Family history of melanoma   . Asymptomatic bacteriuria 11/16/2012  . Intractable vomiting 11/16/2012  . Erosive gastritis 03/15/2012  . Abdominal pain, acute, epigastric 03/14/2012  . Nausea and vomiting 03/14/2012  . Dehydration 03/14/2012  . BV (bacterial vaginosis) 03/14/2012  . GERD (gastroesophageal reflux disease) 03/14/2012  . PVC  (premature ventricular contraction) 07/23/2010    Past Surgical History:  Procedure Laterality Date  . DENTAL SURGERY  05/23/2017  . DILATION AND CURETTAGE OF UTERUS  2006-05-17  . ESOPHAGOGASTRODUODENOSCOPY N/A 03/15/2012   Procedure: ESOPHAGOGASTRODUODENOSCOPY (EGD);  Surgeon: Gatha Mayer, MD;  Location: [facility] ENDOSCOPY;  Service: Endoscopy;  Laterality: N/A;  . HAND SURGERY  May 17, 1999   repair of tendons, nerves after glass laceerated her hand.   Marland Kitchen HERNIA REPAIR  age 81   right inguinal.   . TUBAL LIGATION  2000     OB History   No obstetric history on file.     Family History  Problem Relation Age of Onset  . Breast cancer Mother 43       passed away in 05-17-03  . Hypertension Father   . Hyperlipidemia Father   . Asthma Father        heavy smoker  . GER disease Father   . Stomach cancer Maternal Uncle        late 40s-early 2s  . Lung cancer Paternal Uncle        smoker  . Melanoma Maternal Grandmother        dx in her late 68s-early 27s    Social History   Tobacco Use  . Smoking status: Former Smoker    Packs/day: 1.00    Years: 20.00    Pack years: 20.00    Types: Cigarettes,  E-cigarettes    Quit date: 08/08/2011    Years since quitting: 7.6  . Smokeless tobacco: Never Used  . Tobacco comment: quit jan 2012  Substance Use Topics  . Alcohol use: Yes    Comment: occasionally   . Drug use: No    Home Medications Prior to Admission medications   Medication Sig Start Date End Date Taking? Authorizing Provider  albuterol (PROVENTIL HFA;VENTOLIN HFA) 108 (90 Base) MCG/ACT inhaler Inhale into the lungs every 6 (six) hours as needed for wheezing or shortness of breath.    [provider]  aluminum hydroxide-magnesium carbonate (GAVISCON) 95-358 MG/15ML SUSP Take 15 mLs by mouth 4 (four) times daily - after meals and at bedtime. heartburn    [provider]  amoxicillin (AMOXIL) 500 MG capsule TAKE ONE CAPSULE BY MOUTH 4 TIMES A DAY FOR 7 DAYS 05/24/17    [provider]  docusate sodium (COLACE) 100 MG capsule Take 1 capsule (100 mg total) by mouth every 12 (twelve) hours. 05/27/17   Arby Barrette, MD  HYDROcodone-acetaminophen (NORCO/VICODIN) 5-325 MG tablet TAKE 1 TABLET BY MOUTH EVERY 4-6 HOURS AS NEEDED FOR PAIN 05/24/17   [provider]  HYDROcodone-acetaminophen (NORCO/VICODIN) 5-325 MG tablet Take 1-2 tablets by mouth every 6 (six) hours as needed for moderate pain or severe pain. 05/27/17   Arby Barrette, MD  ibuprofen (ADVIL,MOTRIN) 600 MG tablet TAKE 1 TABLET BY MOUTH FOUR TIMES A DAY FOR 5 DAYS THEN EVERY 6 HOURS IF NEEDED 05/24/17   [provider]  montelukast (SINGULAIR) 10 MG tablet Take 10 mg by mouth at bedtime. 04/28/17   [provider]  Multiple Vitamins-Minerals (MULTIVITAMIN WITH MINERALS) tablet Take 1 tablet by mouth daily.    [provider]  naproxen (NAPROSYN) 500 MG tablet Take 1 tablet (500 mg total) by mouth 2 (two) times daily. 05/27/17   Arby Barrette, MD  orphenadrine (NORFLEX) 100 MG tablet Take 1 tablet (100 mg total) by mouth 2 (two) times daily. 05/27/17   Arby Barrette, MD  phentermine (ADIPEX-P) 37.5 MG tablet Take 37.5 mg by mouth daily. 05/11/17   [provider]  Sodium Bicarbonate-Citric Acid (ALKA-SELTZER HEARTBURN PO) Take 2 each by mouth daily as needed (for nausea).    [provider]  triazolam (HALCION) 0.25 MG tablet PLEASE SEE ATTACHED FOR DETAILED DIRECTIONS 05/21/17   [provider]    Allergies    Sulfa antibiotics  Review of Systems   Review of Systems  All other systems reviewed and are negative.   Physical Exam Updated Vital Signs BP 128/81 (BP Location: Right Arm)   Pulse 87   Temp 98.5 F (36.9 C) (Oral)   Resp 17   SpO2 98%   Physical Exam Vitals and nursing note reviewed.  Constitutional:      Appearance: She is well-developed.  HENT:     Head: Normocephalic and atraumatic.     Right Ear: External  ear normal.     Left Ear: External ear normal.     Mouth/Throat:     Mouth: Mucous membranes are moist.     Pharynx: No oropharyngeal exudate.  Eyes:     Conjunctiva/sclera: Conjunctivae normal.     Pupils: Pupils are equal, round, and reactive to light.  Neck:     Trachea: Phonation normal.  Cardiovascular:     Rate and Rhythm: Normal rate.  Pulmonary:     Effort: Pulmonary effort is normal. No respiratory distress.     Breath sounds: No  stridor.  Abdominal:     General: There is no distension.  Musculoskeletal:        General: Normal range of motion.     Cervical back: Normal range of motion and neck supple.  Skin:    General: Skin is warm and dry.  Neurological:     Mental Status: She is alert and oriented to person, place, and time.     Cranial Nerves: No cranial nerve deficit.     Sensory: No sensory deficit.     Motor: No abnormal muscle tone.     Coordination: Coordination normal.  Psychiatric:        Mood and Affect: Mood normal.        Behavior: Behavior normal.        Thought Content: Thought content normal.        Judgment: Judgment normal.     ED Results / Procedures / Treatments   Labs (all labs ordered are listed, but only abnormal results are displayed) Labs Reviewed  BASIC METABOLIC PANEL - Abnormal; Notable for the following components:      Result Value   BUN 21 (*)    All other components within normal limits  CBC - Abnormal; Notable for the following components:   RDW 15.8 (*)    All other components within normal limits  TROPONIN I (HIGH SENSITIVITY)  TROPONIN I (HIGH SENSITIVITY)    EKG EKG Interpretation  Date/Time:  Monday March 27 2019 14:30:40 EDT Ventricular Rate:  99 PR Interval:  118 QRS Duration: 92 QT Interval:  338 QTC Calculation: 433 R Axis:   78 Text Interpretation: Normal sinus rhythm Normal ECG since last tracing no significant change Confirmed by Mancel Bale [phone]) on 03/27/2019 6:07:45 PM   Radiology DG Chest 2  View  Result Date: 03/27/2019 CLINICAL DATA:  Chest pain and shortness of breath. EXAM: CHEST - 2 VIEW COMPARISON:  Chest x-ray dated May 27, 2017. FINDINGS: The heart size and mediastinal contours are within normal limits. Both lungs are clear. The visualized skeletal structures are unremarkable. IMPRESSION: No active cardiopulmonary disease. Electronically Signed   By: Obie Dredge M.D.   On: 03/27/2019 15:39    Procedures Procedures (including critical care time)  Medications Ordered in ED Medications  sodium chloride flush (NS) 0.9 % injection 3 mL (3 mLs Intravenous Not Given 03/27/19 1850)    ED Course  I have reviewed the triage vital signs and the nursing notes.  Pertinent labs & imaging results that were available during my care of the patient were reviewed by me and considered in my medical decision making (see chart for details).  Clinical Course as of Mar 27 1906  Mon Mar 27, 2019  1805 Normal  Troponin I (High Sensitivity) [EW]  1805 Normal  CBC(!) [EW]  1805 Normal except BUN high  Basic metabolic panel(!) [EW]    Clinical Course User Index [EW] Mancel Bale, MD   MDM Rules/Calculators/A&P                       Patient Vitals for the past 24 hrs:  BP Temp Temp src Pulse Resp SpO2  03/27/19 1847 128/81 - - 87 17 98 %  03/27/19 1624 107/72 98.5 F (36.9 C) Oral 87 15 98 %    7:08 PM Reevaluation with update and discussion. After initial assessment and treatment, an updated evaluation reveals she is comfortable.  No cardiac arrhythmias on cardiac monitor.  Findings discussed with patient  and her husband, all questions answered. Mancel Bale   Medical Decision Making: Palpitations, with reassuring evaluation in the ED.  No syncope.  No cardiac abnormalities on testing and no electrolyte abnormalities.  No indication for further ED evaluation or hospitalization, at this time.  CRITICAL CARE-no Performed by: Mancel Bale   Nursing Notes Reviewed/ Care  Coordinated Applicable Imaging Reviewed Interpretation of Laboratory Data incorporated into ED treatment  The patient appears reasonably screened and/or stabilized for discharge and I doubt any other medical condition or other [facility] requiring further screening, evaluation, or treatment in the ED at this time prior to discharge.  Plan: Home Medications-continue usual; Home Treatments-rest, fluids; return here if the recommended treatment, does not improve the symptoms; Recommended follow up-cardiology follow-up, anticipate event monitoring for diagnosis of etiology of palpitations.  Final Clinical Impression(s) / ED Diagnoses Final diagnoses:  Palpitations    Rx / DC Orders ED Discharge Orders    None       Mancel Bale, MD 03/27/19 1910

## 2019-03-27 NOTE — Discharge Instructions (Addendum)
Avoid products like caffeine which can cause the heart to go faster.  Make sure you are getting plenty of rest, drinking a lot of fluids, eating regularly and try to minimize stress.  Return here, if needed, for problems.

## 2019-03-27 NOTE — ED Notes (Signed)
Pt verbalized understanding of discharge instructions. Follow up care reviewed, pt had no further questions. 

## 2019-03-31 ENCOUNTER — Other Ambulatory Visit: Payer: Self-pay

## 2019-03-31 ENCOUNTER — Ambulatory Visit
Admission: RE | Admit: 2019-03-31 | Discharge: 2019-03-31 | Disposition: A | Discharge status: Home / Self Care | Payer: 59 | Source: Ambulatory Visit | Attending: Nurse Practitioner | Admitting: Nurse Practitioner

## 2019-03-31 DIAGNOSIS — Z1231 Encounter for screening mammogram for malignant neoplasm of breast: Secondary | ICD-10-CM

## 2019-04-03 ENCOUNTER — Other Ambulatory Visit: Payer: Self-pay | Admitting: Nurse Practitioner

## 2019-04-03 DIAGNOSIS — R928 Other abnormal and inconclusive findings on diagnostic imaging of breast: Secondary | ICD-10-CM

## 2019-04-13 ENCOUNTER — Other Ambulatory Visit: Payer: Self-pay

## 2019-04-13 ENCOUNTER — Other Ambulatory Visit: Payer: Self-pay | Admitting: Nurse Practitioner

## 2019-04-13 ENCOUNTER — Ambulatory Visit
Admission: RE | Admit: 2019-04-13 | Discharge: 2019-04-13 | Disposition: A | Discharge status: Home / Self Care | Payer: 59 | Source: Ambulatory Visit | Attending: Nurse Practitioner | Admitting: Nurse Practitioner

## 2019-04-13 DIAGNOSIS — R928 Other abnormal and inconclusive findings on diagnostic imaging of breast: Secondary | ICD-10-CM

## 2019-04-13 DIAGNOSIS — N6310 Unspecified lump in the right breast, unspecified quadrant: Secondary | ICD-10-CM

## 2019-04-24 ENCOUNTER — Ambulatory Visit
Admission: RE | Admit: 2019-04-24 | Discharge: 2019-04-24 | Disposition: A | Discharge status: Home / Self Care | Payer: 59 | Source: Ambulatory Visit | Attending: Nurse Practitioner | Admitting: Nurse Practitioner

## 2019-04-24 ENCOUNTER — Other Ambulatory Visit: Payer: Self-pay

## 2019-04-24 DIAGNOSIS — N6310 Unspecified lump in the right breast, unspecified quadrant: Secondary | ICD-10-CM

## 2019-07-02 IMAGING — CT CT ABD-PELV W/ CM
2 of 5 series · 17 of 46 positions shown, 19 images · IV contrast (APPLIED)
Comparison: 11/25/2014 CT.  05/27/2017 ultrasound.

CLINICAL DATA: 46-year-old female with acute RIGHT abdominal and
pelvic pain for 4 days.

EXAM:
CT ABDOMEN AND PELVIS WITH CONTRAST
TECHNIQUE: Multidetector CT imaging of the abdomen and pelvis was performed
using the standard protocol following bolus administration of
intravenous contrast.
CONTRAST:  100 cc intravenous [id number]

[Series 2: axial st · axial · 0.85mm/px · z∈[-417,-22]mm · 14 of 89 slices shown, 16 images]
[im 5/89  soft-tissue]
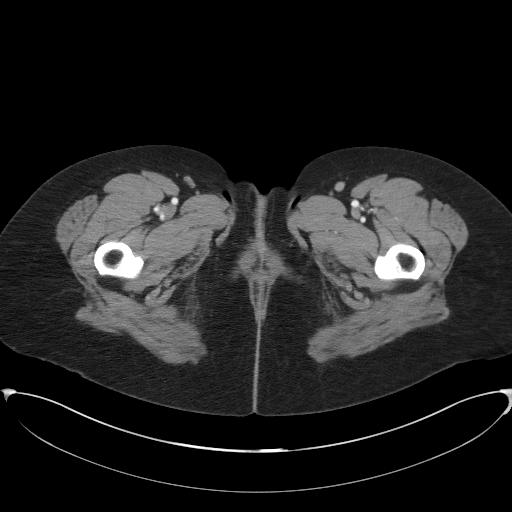
[im 5/89  bone]
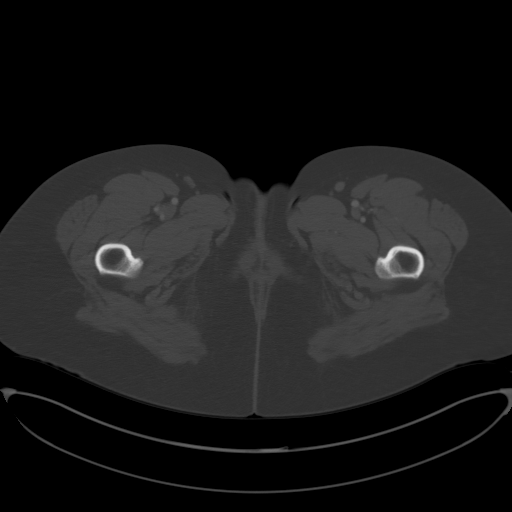
[im 10/89  soft-tissue]
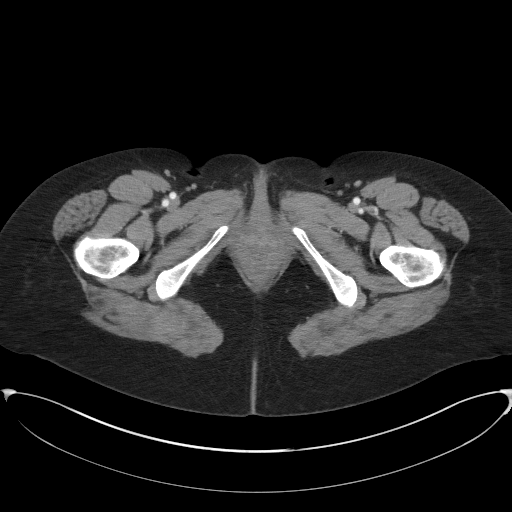
[im 20/89  soft-tissue]
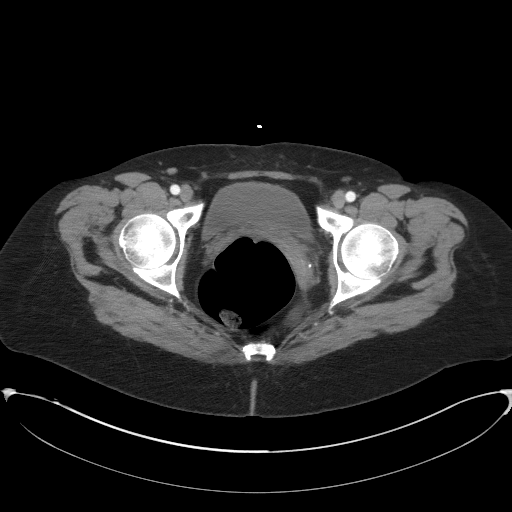
[im 25/89  soft-tissue]
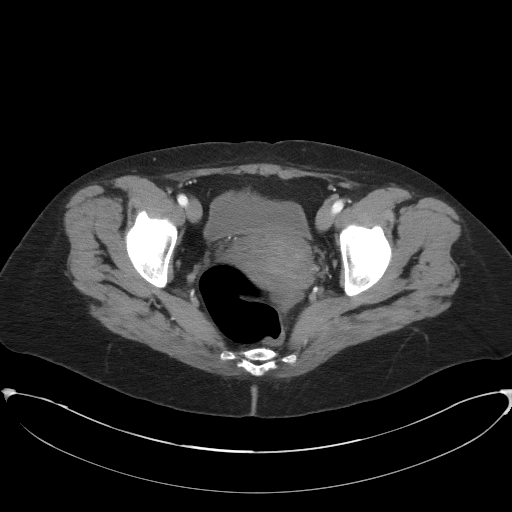
[im 30/89  soft-tissue]
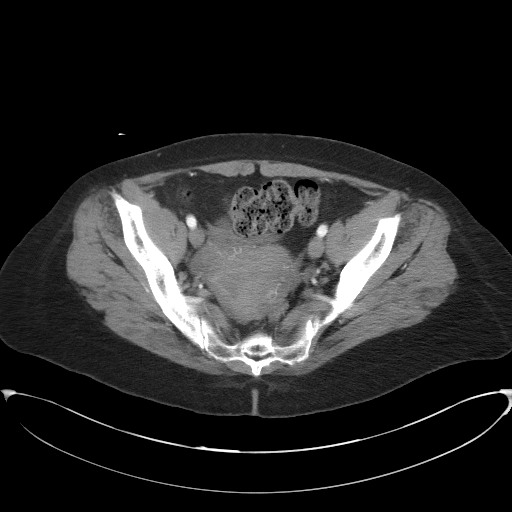
[im 35/89  soft-tissue]
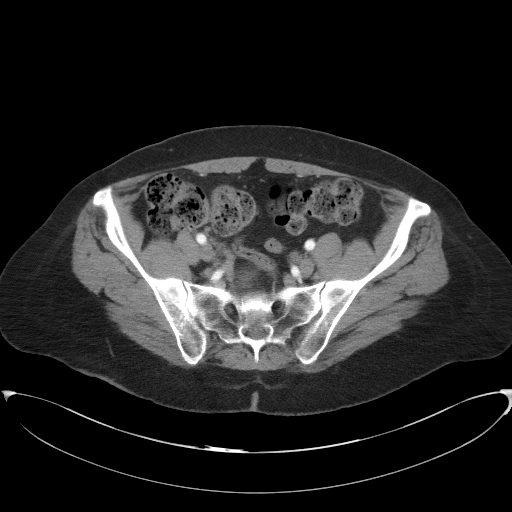
[im 40/89  soft-tissue]
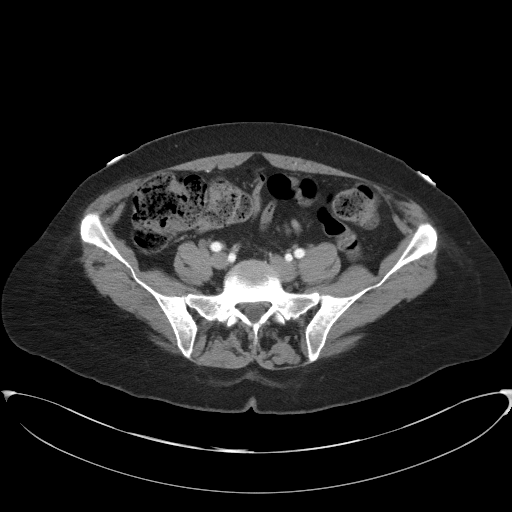
[im 49/89  soft-tissue]
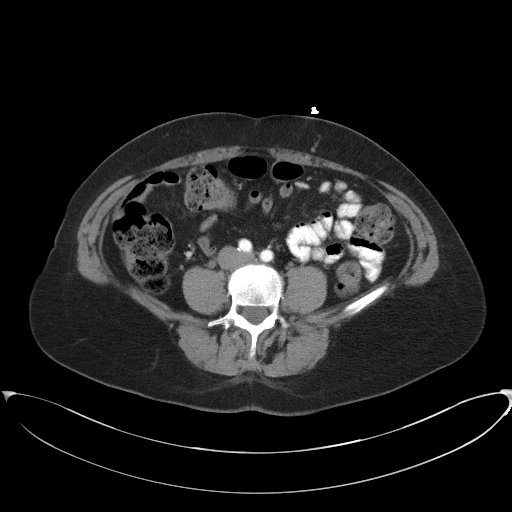
[im 54/89  soft-tissue]
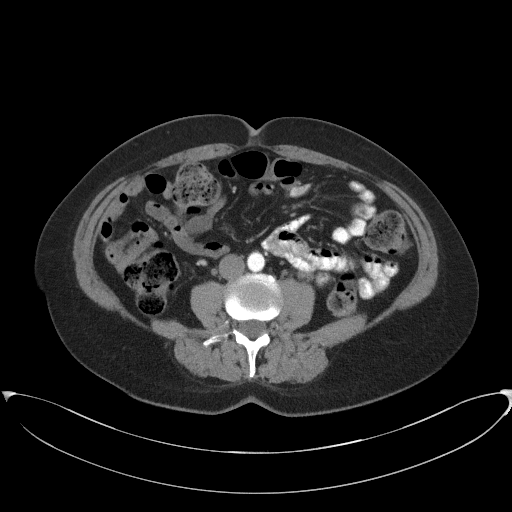
[im 54/89  bone]
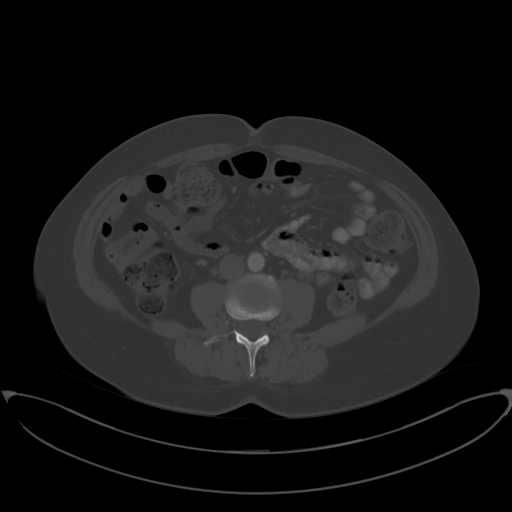
[im 59/89  soft-tissue]
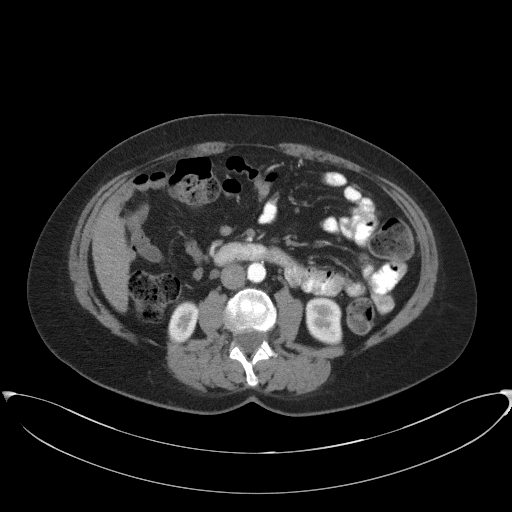
[im 64/89  soft-tissue]
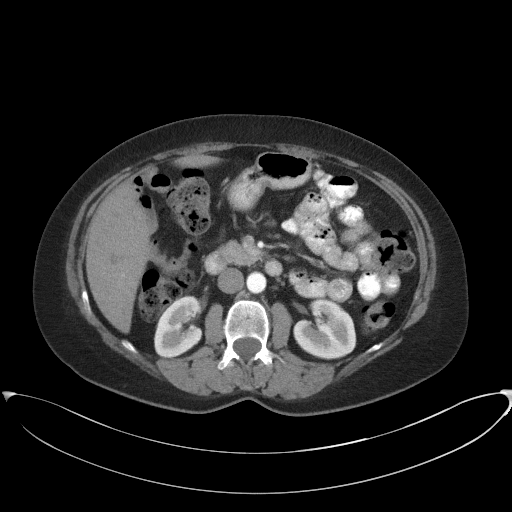
[im 69/89  soft-tissue]
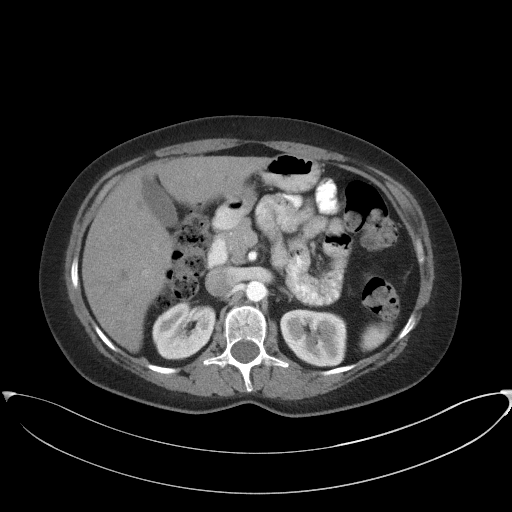
[im 79/89  soft-tissue]
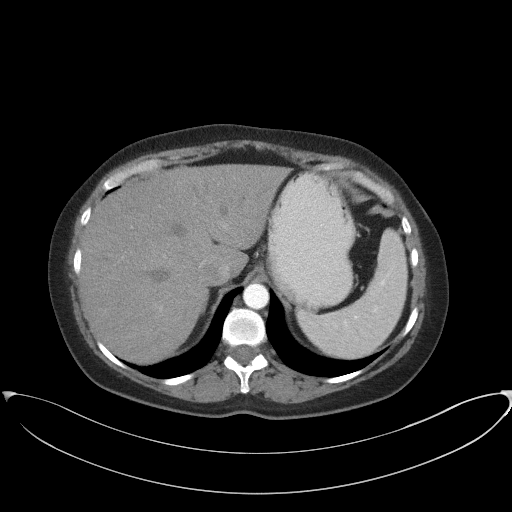
[im 84/89  soft-tissue]
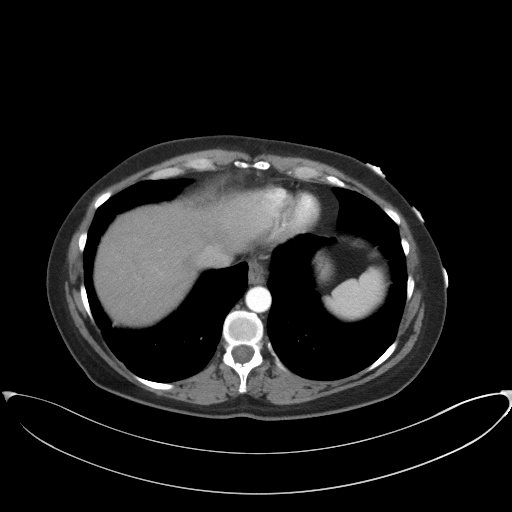

[Series 5: coronal st · coronal · 0.89mm/px · 3 of 91 slices shown]
[im 31/91  soft-tissue]
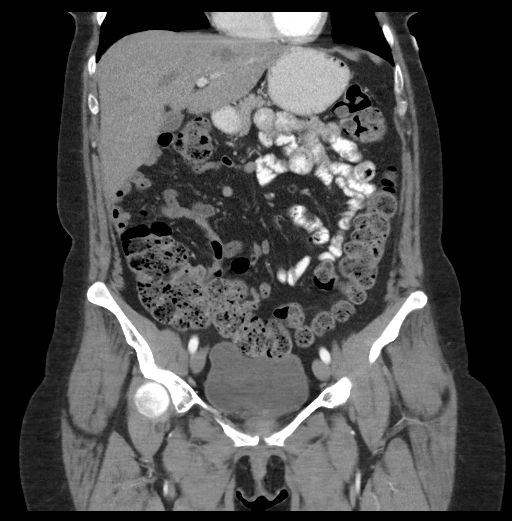
[im 41/91  soft-tissue]
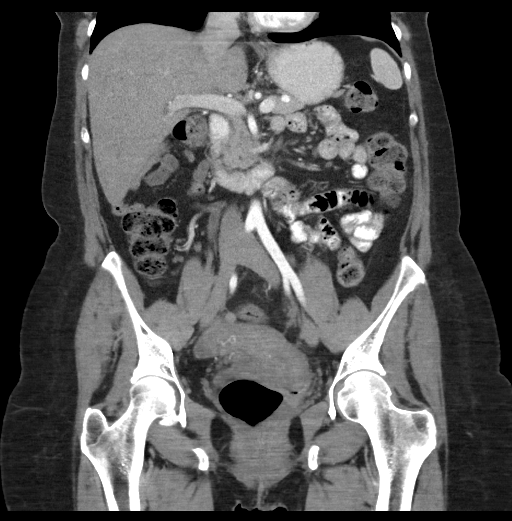
[im 51/91  soft-tissue]
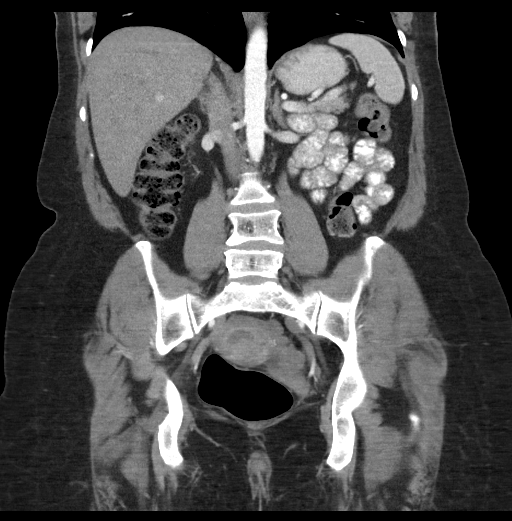

[17 of 46 positions shown; findings below may reference images not displayed]

FINDINGS: Lower chest: No acute abnormality

Hepatobiliary: The liver and gallbladder are unremarkable. No
biliary dilatation.

Pancreas: Unremarkable

Spleen: Unremarkable

Adrenals/Urinary Tract: The kidneys, adrenal glands and bladder are
unremarkable.

Stomach/Bowel: Stomach is within normal limits. Appendix appears
normal. No evidence of bowel wall thickening, distention, or
inflammatory changes.

A moderate amount of stool throughout the colon noted.

Vascular/Lymphatic: No significant vascular findings are present. No
enlarged abdominal or pelvic lymph nodes.

Reproductive: Uterus and bilateral adnexa are unremarkable.

Other: No ascites, abscess or pneumoperitoneum.

Musculoskeletal: No acute or significant osseous findings.
IMPRESSION: 1. No acute abnormalities.
2. Moderate colonic stool.

## 2020-03-05 ENCOUNTER — Other Ambulatory Visit: Payer: Self-pay | Admitting: Internal Medicine

## 2020-03-05 DIAGNOSIS — Z1231 Encounter for screening mammogram for malignant neoplasm of breast: Secondary | ICD-10-CM

## 2020-04-26 ENCOUNTER — Other Ambulatory Visit: Payer: Self-pay

## 2020-04-26 ENCOUNTER — Ambulatory Visit
Admission: RE | Admit: 2020-04-26 | Discharge: 2020-04-26 | Disposition: A | Discharge status: Home / Self Care | Payer: 59 | Source: Ambulatory Visit | Attending: Internal Medicine | Admitting: Internal Medicine

## 2020-04-26 DIAGNOSIS — Z1231 Encounter for screening mammogram for malignant neoplasm of breast: Secondary | ICD-10-CM

## 2021-01-09 DIAGNOSIS — M5416 Radiculopathy, lumbar region: Secondary | ICD-10-CM | POA: Diagnosis not present

## 2021-01-21 DIAGNOSIS — M5416 Radiculopathy, lumbar region: Secondary | ICD-10-CM | POA: Diagnosis not present

## 2021-02-05 DIAGNOSIS — M5416 Radiculopathy, lumbar region: Secondary | ICD-10-CM | POA: Diagnosis not present

## 2021-02-16 DIAGNOSIS — J0100 Acute maxillary sinusitis, unspecified: Secondary | ICD-10-CM | POA: Diagnosis not present

## 2021-02-16 DIAGNOSIS — R059 Cough, unspecified: Secondary | ICD-10-CM | POA: Diagnosis not present

## 2021-02-16 DIAGNOSIS — J029 Acute pharyngitis, unspecified: Secondary | ICD-10-CM | POA: Diagnosis not present

## 2021-02-16 DIAGNOSIS — Z20828 Contact with and (suspected) exposure to other viral communicable diseases: Secondary | ICD-10-CM | POA: Diagnosis not present

## 2021-02-16 DIAGNOSIS — M791 Myalgia, unspecified site: Secondary | ICD-10-CM | POA: Diagnosis not present

## 2021-02-18 DIAGNOSIS — M5416 Radiculopathy, lumbar region: Secondary | ICD-10-CM | POA: Diagnosis not present

## 2021-03-07 DIAGNOSIS — Z1211 Encounter for screening for malignant neoplasm of colon: Secondary | ICD-10-CM | POA: Diagnosis not present

## 2021-03-14 DIAGNOSIS — M5416 Radiculopathy, lumbar region: Secondary | ICD-10-CM | POA: Diagnosis not present

## 2021-04-09 DIAGNOSIS — M5126 Other intervertebral disc displacement, lumbar region: Secondary | ICD-10-CM | POA: Diagnosis not present

## 2021-04-09 DIAGNOSIS — J4530 Mild persistent asthma, uncomplicated: Secondary | ICD-10-CM | POA: Diagnosis not present

## 2021-04-09 DIAGNOSIS — Z01419 Encounter for gynecological examination (general) (routine) without abnormal findings: Secondary | ICD-10-CM | POA: Diagnosis not present

## 2021-04-09 DIAGNOSIS — Z0000 Encounter for general adult medical examination without abnormal findings: Secondary | ICD-10-CM | POA: Diagnosis not present

## 2021-04-09 DIAGNOSIS — J3089 Other allergic rhinitis: Secondary | ICD-10-CM | POA: Diagnosis not present

## 2021-05-20 ENCOUNTER — Other Ambulatory Visit: Payer: Self-pay | Admitting: Internal Medicine

## 2021-05-20 ENCOUNTER — Other Ambulatory Visit: Payer: Self-pay | Admitting: Obstetrics and Gynecology

## 2021-05-20 DIAGNOSIS — Z1231 Encounter for screening mammogram for malignant neoplasm of breast: Secondary | ICD-10-CM

## 2021-05-23 ENCOUNTER — Ambulatory Visit: Payer: 59

## 2021-05-30 DIAGNOSIS — N951 Menopausal and female climacteric states: Secondary | ICD-10-CM | POA: Diagnosis not present

## 2021-05-30 DIAGNOSIS — G479 Sleep disorder, unspecified: Secondary | ICD-10-CM | POA: Diagnosis not present

## 2021-05-30 DIAGNOSIS — R5383 Other fatigue: Secondary | ICD-10-CM | POA: Diagnosis not present

## 2021-05-30 DIAGNOSIS — R232 Flushing: Secondary | ICD-10-CM | POA: Diagnosis not present

## 2021-06-06 DIAGNOSIS — M5416 Radiculopathy, lumbar region: Secondary | ICD-10-CM | POA: Diagnosis not present

## 2021-06-10 ENCOUNTER — Encounter: Payer: Self-pay | Admitting: Pulmonary Disease

## 2021-06-10 ENCOUNTER — Ambulatory Visit: Payer: BC Managed Care – PPO | Admitting: Pulmonary Disease

## 2021-06-10 VITALS — BP 124/82 | HR 89 | Wt 201.8 lb

## 2021-06-10 DIAGNOSIS — Z87891 Personal history of nicotine dependence: Secondary | ICD-10-CM

## 2021-06-10 DIAGNOSIS — J4530 Mild persistent asthma, uncomplicated: Secondary | ICD-10-CM

## 2021-06-10 DIAGNOSIS — K219 Gastro-esophageal reflux disease without esophagitis: Secondary | ICD-10-CM

## 2021-06-10 MED ORDER — ADVAIR DISKUS 250-50 MCG/ACT IN AEPB: 1.0000 | INHALATION_SPRAY | Freq: Two times a day (BID) | RESPIRATORY_TRACT | 6 refills | Status: AC

## 2021-06-10 MED ORDER — MONTELUKAST SODIUM 10 MG PO TABS: 10.0000 mg | ORAL_TABLET | Freq: Every day | ORAL | 11 refills | Status: AC

## 2021-06-10 NOTE — Progress Notes (Signed)
Synopsis: Referred in June 2023 for asthma by Lavone Orn, MD  Subjective:   PATIENT ID: Jenny Hudson GENDER: female DOB: 1970/09/23, MRN: [id number]  HPI  Chief Complaint  Patient presents with   Consult    Referred by PCP for history of asthma. States she has had asthma for the past 10 years. Has constant congestion that she is not able to cough up.    Jenny Hudson is a 51 year old woman, former smoker with seasonal allergies and asthma who is referred to pulmonary clinic for evaluation of her asthma.   She reports having exercise induced asthma since childhood which was progressed to more persistent asthma over the years. She has issues with allergies that affect her breathing. Spring time can be the most difficult season. She does have sinus congestion with post-nasal drianage. She does not like to use nasal sprays. She has cough and is constantly clearing her throat. She feels her throat is always congested. She has occasional wheezing. She as no night time awakenings.   She is currently using flovent 19mcg 2 puffs twice daily and as needed albuterol. She is using albuterol about once daily. She is using zyrtec for allergies. She was previously on montelukast.   Her GERD is well controlled since being on esomeprazole.   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 She has 20 pack year history and quit in April 21, 2011. Smoked 1.5 packs for many years. She did vape for 3 years when she was trying to quit. No hobbies with significant dust or chemical exposures. 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 She has allergy to cats.   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 animals   Epilepsy ([place])    as child; has been on phenobarbital and tegretal in the past, at around 51 yo, had repeat EEG which was normal, and was tapered off seizure medications   Erosive gastritis 03/15/2012    Family history of breast cancer    Family history of melanoma    Family history of stomach cancer    Pulse irregularity    Seasonal allergies      Family History  Problem Relation Age of Onset   Breast cancer Mother 40       passed away in 04-21-2003   Hypertension Father    Hyperlipidemia Father    Asthma Father        heavy smoker   GER disease Father    Stomach cancer Maternal Uncle        late 40s-early 34s   Lung cancer Paternal Uncle        smoker   Melanoma Maternal Grandmother        dx in her late 24s-early 41s     Social History   Socioeconomic History   Marital status: Married    Spouse name: Jenny Hudson   Number of children: 4   Years of education: RN   Highest education level: Not on file  Occupational History   Occupation: RN E.[employer]    Employer: [employer]    Employer: health system management  Tobacco Use   Smoking status: Former    Packs/day: 1.00    Years: 20.00    Pack years: 20.00    Types: Cigarettes, E-cigarettes    Quit date: 08/08/2011  Years since quitting: 9.8   Smokeless tobacco: Never   Tobacco comments:    quit jan 2012  Substance and Sexual Activity   Alcohol use: Yes    Comment: occasionally    Drug use: No   Sexual activity: Not on file  Other Topics Concern   Not on file  Social History Narrative   Not on file   Social Determinants of Health   Financial Resource Strain: Not on file  Food Insecurity: Not on file  Transportation Needs: Not on file  Physical Activity: Not on file  Stress: Not on file  Social Connections: Not on file  Intimate Partner Violence: Not on file     Allergies  Allergen Reactions   Sulfa Antibiotics Rash     Outpatient Medications Prior to Visit  Medication Sig Dispense Refill   albuterol (PROVENTIL HFA;VENTOLIN HFA) 108 (90 Base) MCG/ACT inhaler Inhale into the lungs every 6 (six) hours as needed for wheezing or shortness of breath.     cetirizine (ZYRTEC) 10 MG tablet Take 10 mg  by mouth daily.     cyclobenzaprine (FLEXERIL) 10 MG tablet Take 10 mg by mouth at bedtime as needed.     docusate sodium (COLACE) 100 MG capsule Take 1 capsule (100 mg total) by mouth every 12 (twelve) hours. 60 capsule 0   esomeprazole (NEXIUM) 40 MG capsule Take 40 mg by mouth daily at 12 noon.     gabapentin (NEURONTIN) 300 MG capsule Take 600 mg by mouth 3 (three) times daily.     ibuprofen (ADVIL,MOTRIN) 600 MG tablet TAKE 1 TABLET BY MOUTH FOUR TIMES A DAY FOR 5 DAYS THEN EVERY 6 HOURS IF NEEDED  0   meloxicam (MOBIC) 7.5 MG tablet Take 7.5 mg by mouth daily.     methylPREDNISolone (MEDROL DOSEPAK) 4 MG TBPK tablet Take by mouth.     Multiple Vitamins-Minerals (MULTIVITAMIN WITH MINERALS) tablet Take 1 tablet by mouth daily.     naproxen (NAPROSYN) 500 MG tablet Take 1 tablet (500 mg total) by mouth 2 (two) times daily. 30 tablet 0   norethindrone-ethinyl estradiol-FE (LOESTRIN FE) 1-20 MG-MCG tablet Take 1 tablet by mouth daily.     Phentermine HCl 8 MG TABS Take 8 mg by mouth daily.     fluticasone (FLOVENT HFA) 110 MCG/ACT inhaler Inhale 2 puffs into the lungs 2 (two) times daily.     aluminum hydroxide-magnesium carbonate (GAVISCON) 95-358 MG/15ML SUSP Take 15 mLs by mouth 4 (four) times daily - after meals and at bedtime. heartburn     amoxicillin (AMOXIL) 500 MG capsule TAKE ONE CAPSULE BY MOUTH 4 TIMES A DAY FOR 7 DAYS  0   HYDROcodone-acetaminophen (NORCO/VICODIN) 5-325 MG tablet TAKE 1 TABLET BY MOUTH EVERY 4-6 HOURS AS NEEDED FOR PAIN  0   HYDROcodone-acetaminophen (NORCO/VICODIN) 5-325 MG tablet Take 1-2 tablets by mouth every 6 (six) hours as needed for moderate pain or severe pain. 20 tablet 0   montelukast (SINGULAIR) 10 MG tablet Take 10 mg by mouth at bedtime.  11   orphenadrine (NORFLEX) 100 MG tablet Take 1 tablet (100 mg total) by mouth 2 (two) times daily. 30 tablet 0   phentermine (ADIPEX-P) 37.5 MG tablet Take 37.5 mg by mouth daily.  0   Sodium Bicarbonate-Citric  Acid (ALKA-SELTZER HEARTBURN PO) Take 2 each by mouth daily as needed (for nausea).     triazolam (HALCION) 0.25 MG tablet PLEASE SEE ATTACHED FOR DETAILED DIRECTIONS  0   No facility-administered medications  prior to visit.   Review of Systems  Constitutional:  Negative for chills, fever, malaise/fatigue and weight loss.  HENT:  Positive for congestion. Negative for sinus pain and sore throat.   Eyes: Negative.   Respiratory:  Positive for cough, sputum production, shortness of breath and wheezing. Negative for hemoptysis.   Cardiovascular:  Negative for chest pain, palpitations, orthopnea, claudication and leg swelling.  Gastrointestinal:  Negative for abdominal pain, heartburn, nausea and vomiting.  Genitourinary: Negative.   Musculoskeletal:  Negative for joint pain and myalgias.  Skin:  Negative for rash.  Neurological:  Positive for headaches. Negative for weakness.  Endo/Heme/Allergies: Negative.   Psychiatric/Behavioral: Negative.     Objective:   Vitals:   06/10/21 1035  BP: 124/82  Pulse: 89  SpO2: 97%  Weight: 201 lb 12.8 oz (91.5 kg)   Physical Exam Constitutional:      General: She is not in acute distress.    Appearance: She is not ill-appearing.  HENT:     Head: Normocephalic and atraumatic.  Eyes:     General: No scleral icterus.    Conjunctiva/sclera: Conjunctivae normal.     Pupils: Pupils are equal, round, and reactive to light.  Cardiovascular:     Rate and Rhythm: Normal rate and regular rhythm.     Pulses: Normal pulses.     Heart sounds: Normal heart sounds. No murmur heard. Pulmonary:     Effort: Pulmonary effort is normal.     Breath sounds: Normal breath sounds. No wheezing, rhonchi or rales.  Abdominal:     General: Bowel sounds are normal.     Palpations: Abdomen is soft.  Musculoskeletal:     Right lower leg: No edema.     Left lower leg: No edema.  Lymphadenopathy:     Cervical: No cervical adenopathy.  Skin:    General: Skin is warm  and dry.  Neurological:     General: No focal deficit present.     Mental Status: She is alert.  Psychiatric:        Mood and Affect: Mood normal.        Behavior: Behavior normal.        Thought Content: Thought content normal.        Judgment: Judgment normal.    CBC    Component Value Date/Time   WBC 5.7 03/27/2019 1448   RBC 4.61 03/27/2019 1448   HGB 12.0 03/27/2019 1448   HCT 38.2 03/27/2019 1448   PLT 284 03/27/2019 1448   MCV 82.9 03/27/2019 1448   MCH 26.0 03/27/2019 1448   MCHC 31.4 03/27/2019 1448   RDW 15.8 (H) 03/27/2019 1448   LYMPHSABS 1.9 05/27/2017 0852   MONOABS 0.6 05/27/2017 0852   EOSABS 0.2 05/27/2017 0852   BASOSABS 0.0 05/27/2017 0852      Latest Ref Rng & Units 03/27/2019    2:48 PM 05/27/2017    8:52 AM 02/26/2016    4:14 PM  BMP  Glucose 70 - 99 mg/dL 99   92   104    BUN 6 - 20 mg/dL 21   13   24     Creatinine 0.44 - 1.00 mg/dL 0.82   0.80   0.82    Sodium 135 - 145 mmol/L 138   139   137    Potassium 3.5 - 5.1 mmol/L 3.8   3.7   3.7    Chloride 98 - 111 mmol/L 106   108   104  CO2 22 - 32 mmol/L 23   21   24     Calcium 8.9 - 10.3 mg/dL 9.2   8.9   9.4     Chest imaging: CXR 03/27/19 The heart size and mediastinal contours are within normal limits. Both lungs are clear. The visualized skeletal structures are unremarkable.  PFT:     View : No data to display.          Labs:  Path:  Echo:  Heart Catheterization:  Assessment & Plan:   Gastroesophageal reflux disease without esophagitis  Former smoker - Plan: Ambulatory Referral for Lung Cancer Scre  Mild persistent asthma without complication - Plan: fluticasone-salmeterol (ADVAIR DISKUS) 250-50 MCG/ACT AEPB, montelukast (SINGULAIR) 10 MG tablet  Discussion: Deleshia Brause is a 51 year old woman, former smoker with seasonal allergies and asthma who is referred to pulmonary clinic for evaluation of her asthma.   She appears to have mild persistent asthma. We will start  her on advair diksus 250-57mcg 1 puff twice daily. She can continue albuterol inhaler as needed.  She is to start montelukast 10mg  daily and continue zyrtec daily.   We will refer her to lung cancer screening program as she has over 20 pack year smoking history and is turning 51 this year.   Follow up in 3 months for pulmonary function tests.   Freda Jackson, MD [facility] Office: [phone]   Current Outpatient Medications:    albuterol (PROVENTIL HFA;VENTOLIN HFA) 108 (90 Base) MCG/ACT inhaler, Inhale into the lungs every 6 (six) hours as needed for wheezing or shortness of breath., Disp: , Rfl:    cetirizine (ZYRTEC) 10 MG tablet, Take 10 mg by mouth daily., Disp: , Rfl:    cyclobenzaprine (FLEXERIL) 10 MG tablet, Take 10 mg by mouth at bedtime as needed., Disp: , Rfl:    docusate sodium (COLACE) 100 MG capsule, Take 1 capsule (100 mg total) by mouth every 12 (twelve) hours., Disp: 60 capsule, Rfl: 0   esomeprazole (NEXIUM) 40 MG capsule, Take 40 mg by mouth daily at 12 noon., Disp: , Rfl:    fluticasone-salmeterol (ADVAIR DISKUS) 250-50 MCG/ACT AEPB, Inhale 1 puff into the lungs in the morning and at bedtime., Disp: 180 each, Rfl: 6   gabapentin (NEURONTIN) 300 MG capsule, Take 600 mg by mouth 3 (three) times daily., Disp: , Rfl:    ibuprofen (ADVIL,MOTRIN) 600 MG tablet, TAKE 1 TABLET BY MOUTH FOUR TIMES A DAY FOR 5 DAYS THEN EVERY 6 HOURS IF NEEDED, Disp: , Rfl: 0   meloxicam (MOBIC) 7.5 MG tablet, Take 7.5 mg by mouth daily., Disp: , Rfl:    methylPREDNISolone (MEDROL DOSEPAK) 4 MG TBPK tablet, Take by mouth., Disp: , Rfl:    montelukast (SINGULAIR) 10 MG tablet, Take 1 tablet (10 mg total) by mouth at bedtime., Disp: 30 tablet, Rfl: 11   Multiple Vitamins-Minerals (MULTIVITAMIN WITH MINERALS) tablet, Take 1 tablet by mouth daily., Disp: , Rfl:    naproxen (NAPROSYN) 500 MG tablet, Take 1 tablet (500 mg total) by mouth 2 (two) times daily., Disp: 30 tablet,  Rfl: 0   norethindrone-ethinyl estradiol-FE (LOESTRIN FE) 1-20 MG-MCG tablet, Take 1 tablet by mouth daily., Disp: , Rfl:    Phentermine HCl 8 MG TABS, Take 8 mg by mouth daily., Disp: , Rfl:

## 2021-06-10 NOTE — Patient Instructions (Signed)
Stop flovent inhaler  Start advair diskus 1 puff twice daily - rinse mouth after each use  Use albuterol inhaler 1-2 puffs every 4-6 hours as needed  Continue zyrtec for allergies  Start montelukast 10mg  at bedtime for allergies/asthma  We will refer you to our lung cancer screening program.  Follow up in 3 months with pulmonary function tests.

## 2021-07-16 ENCOUNTER — Ambulatory Visit
Admission: RE | Admit: 2021-07-16 | Discharge: 2021-07-16 | Disposition: A | Discharge status: Home / Self Care | Payer: BC Managed Care – PPO | Source: Ambulatory Visit | Attending: Obstetrics and Gynecology | Admitting: Obstetrics and Gynecology

## 2021-07-16 DIAGNOSIS — Z1231 Encounter for screening mammogram for malignant neoplasm of breast: Secondary | ICD-10-CM

## 2021-08-28 DIAGNOSIS — M5416 Radiculopathy, lumbar region: Secondary | ICD-10-CM | POA: Diagnosis not present

## 2021-09-10 ENCOUNTER — Encounter: Payer: Self-pay | Admitting: Pulmonary Disease

## 2021-09-10 ENCOUNTER — Ambulatory Visit: Payer: BC Managed Care – PPO | Admitting: Pulmonary Disease

## 2021-09-10 ENCOUNTER — Ambulatory Visit (INDEPENDENT_AMBULATORY_CARE_PROVIDER_SITE_OTHER): Payer: BC Managed Care – PPO | Admitting: Pulmonary Disease

## 2021-09-10 VITALS — BP 114/62 | HR 92 | Temp 98.0°F | Ht 67.0 in | Wt 201.0 lb

## 2021-09-10 DIAGNOSIS — J4530 Mild persistent asthma, uncomplicated: Secondary | ICD-10-CM

## 2021-09-10 LAB — PULMONARY FUNCTION TEST
DL/VA % pred: 98 %
DL/VA: 4.17 ml/min/mmHg/L
DLCO cor % pred: 84 %
DLCO cor: 19.73 ml/min/mmHg
DLCO unc % pred: 84 %
DLCO unc: 19.73 ml/min/mmHg
FEF 25-75 Post: 3.83 L/sec
FEF 25-75 Pre: 3.85 L/sec
FEF2575-%Change-Post: 0 %
FEF2575-%Pred-Post: 128 %
FEF2575-%Pred-Pre: 129 %
FEV1-%Change-Post: 3 %
FEV1-%Pred-Post: 95 %
FEV1-%Pred-Pre: 92 %
FEV1-Post: 2.99 L
FEV1-Pre: 2.88 L
FEV1FVC-%Change-Post: 0 %
FEV1FVC-%Pred-Pre: 108 %
FEV6-%Change-Post: 3 %
FEV6-%Pred-Post: 89 %
FEV6-%Pred-Pre: 86 %
FEV6-Post: 3.41 L
FEV6-Pre: 3.3 L
FEV6FVC-%Pred-Post: 102 %
FEV6FVC-%Pred-Pre: 102 %
FVC-%Change-Post: 3 %
FVC-%Pred-Post: 86 %
FVC-%Pred-Pre: 83 %
FVC-Post: 3.41 L
FVC-Pre: 3.3 L
Post FEV1/FVC ratio: 88 %
Post FEV6/FVC ratio: 100 %
Pre FEV1/FVC ratio: 87 %
Pre FEV6/FVC Ratio: 100 %
RV % pred: 90 %
RV: 1.73 L
TLC % pred: 96 %
TLC: 5.31 L

## 2021-09-10 NOTE — Patient Instructions (Signed)
Full PFT performed today. °

## 2021-09-10 NOTE — Progress Notes (Signed)
Synopsis: Referred in June 2023 for asthma by Kirby Funk, MD  Subjective:   PATIENT ID: Jenny Hudson GENDER: female DOB: Apr 19, 1970, MRN: [id number]  HPI  Chief Complaint  Patient presents with   Follow-up    Pt is here for follow up for asthma and GERD. Pt states that her breathing is doing ok. Pt states the Advair is working well and she is not having to use the Albuterol as often. Pt is on Nexium for her reflux and no issues noted so far.    Jenny Hudson is a 51 year old woman, former smoker with seasonal allergies and asthma who returns to pulmonary clinic for asthma.   She has been doing well on advair 250-31mcg 1 puff twice daily since last visit along with montelukast 10mg  daily. She has continued on zyrtec. She has used her rescue inhaler only a couple of times.   Her PFTs are within normal limits today.   They are nearing completing of their new house in [place].    OV 06/10/21 She reports having exercise induced asthma since childhood which was progressed to more persistent asthma over the years. She has issues with allergies that affect her breathing. Spring time can be the most difficult season. She does have sinus congestion with post-nasal drianage. She does not like to use nasal sprays. She has cough and is constantly clearing her throat. She feels her throat is always congested. She has occasional wheezing. She as no night time awakenings.   She is currently using flovent 08/10/21 2 puffs twice daily and as needed albuterol. She is using albuterol about once daily. She is using zyrtec for allergies. She was previously on montelukast.   Her GERD is well controlled since being on esomeprazole.   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 She has 20 pack year history and quit in 16-Apr-2011. Smoked 1.5 packs for many years. She did vape for 3 years when she was trying to quit. No hobbies with significant dust or  chemical exposures. She has a 2014 at home. She has allergy to cats.   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 animals   Epilepsy ([facility])    as child; has been on phenobarbital and tegretal in the past, at around 51 yo, had repeat EEG which was normal, and was tapered off seizure medications   Erosive gastritis 03/15/2012   Family history of breast cancer    Family history of melanoma    Family history of stomach cancer    Pulse irregularity    Seasonal allergies      Family History  Problem Relation Age of Onset   Breast cancer Mother 70       passed away in 2003/04/16   Hypertension Father    Hyperlipidemia Father    Asthma Father        heavy smoker   GER disease Father    Stomach cancer Maternal Uncle        late 40s-early 62s   Lung cancer Paternal Uncle        smoker   Melanoma Maternal Grandmother        dx in her late 68s-early 52s     Social History   Socioeconomic History   Marital status: Married    Spouse name: Francesca Strome   Number  of children: 4   Years of education: RN   Highest education level: Not on file  Occupational History   Occupation: RN E.D [employer]    Employer: [employer]    Employer: health system management  Tobacco Use   Smoking status: Former    Packs/day: 1.00    Years: 20.00    Total pack years: 20.00    Types: Cigarettes, E-cigarettes    Quit date: 08/08/2011    Years since quitting: 10.1   Smokeless tobacco: Never   Tobacco comments:    quit jan 2012  Substance and Sexual Activity   Alcohol use: Yes    Comment: occasionally    Drug use: No   Sexual activity: Not on file  Other Topics Concern   Not on file  Social History Narrative   Not on file   Social Determinants of Health   Financial Resource Strain: Not on file  Food Insecurity: Not on file  Transportation Needs: Not on file  Physical Activity: Not on file  Stress: Not on file  Social Connections:  Not on file  Intimate Partner Violence: Not on file     Allergies  Allergen Reactions   Sulfa Antibiotics Rash     Outpatient Medications Prior to Visit  Medication Sig Dispense Refill   albuterol (PROVENTIL HFA;VENTOLIN HFA) 108 (90 Base) MCG/ACT inhaler Inhale into the lungs every 6 (six) hours as needed for wheezing or shortness of breath.     cetirizine (ZYRTEC) 10 MG tablet Take 10 mg by mouth daily.     cyclobenzaprine (FLEXERIL) 10 MG tablet Take 10 mg by mouth at bedtime as needed.     docusate sodium (COLACE) 100 MG capsule Take 1 capsule (100 mg total) by mouth every 12 (twelve) hours. 60 capsule 0   esomeprazole (NEXIUM) 40 MG capsule Take 40 mg by mouth daily at 12 noon.     fluticasone-salmeterol (ADVAIR DISKUS) 250-50 MCG/ACT AEPB Inhale 1 puff into the lungs in the morning and at bedtime. 180 each 6   gabapentin (NEURONTIN) 300 MG capsule Take 600 mg by mouth 3 (three) times daily.     ibuprofen (ADVIL,MOTRIN) 600 MG tablet TAKE 1 TABLET BY MOUTH FOUR TIMES A DAY FOR 5 DAYS THEN EVERY 6 HOURS IF NEEDED  0   meloxicam (MOBIC) 7.5 MG tablet Take 7.5 mg by mouth daily.     montelukast (SINGULAIR) 10 MG tablet Take 1 tablet (10 mg total) by mouth at bedtime. 30 tablet 11   Multiple Vitamins-Minerals (MULTIVITAMIN WITH MINERALS) tablet Take 1 tablet by mouth daily.     norethindrone-ethinyl estradiol-FE (LOESTRIN FE) 1-20 MG-MCG tablet Take 1 tablet by mouth daily.     methylPREDNISolone (MEDROL DOSEPAK) 4 MG TBPK tablet Take by mouth.     naproxen (NAPROSYN) 500 MG tablet Take 1 tablet (500 mg total) by mouth 2 (two) times daily. 30 tablet 0   Phentermine HCl 8 MG TABS Take 8 mg by mouth daily.     No facility-administered medications prior to visit.   Review of Systems  Constitutional:  Negative for chills, fever, malaise/fatigue and weight loss.  HENT:  Negative for congestion, sinus pain and sore throat.   Eyes: Negative.   Respiratory:  Negative for cough, hemoptysis,  sputum production, shortness of breath and wheezing.   Cardiovascular:  Negative for chest pain, palpitations, orthopnea, claudication and leg swelling.  Gastrointestinal:  Negative for abdominal pain, heartburn, nausea and vomiting.  Genitourinary: Negative.   Musculoskeletal:  Negative for joint pain and myalgias.  Skin:  Negative for rash.  Neurological:  Negative for weakness and headaches.  Endo/Heme/Allergies: Negative.   Psychiatric/Behavioral: Negative.      Objective:   Vitals:   09/10/21 1206  BP: 114/62  Pulse: 92  Temp: 98 F (36.7 C)  TempSrc: Oral  SpO2: 97%  Weight: 201 lb (91.2 kg)  Height: 5\' 7"  (1.702 m)   Physical Exam Constitutional:      General: She is not in acute distress.    Appearance: She is not ill-appearing.  HENT:     Head: Normocephalic and atraumatic.  Eyes:     General: No scleral icterus. Cardiovascular:     Rate and Rhythm: Normal rate and regular rhythm.     Pulses: Normal pulses.     Heart sounds: Normal heart sounds. No murmur heard. Pulmonary:     Effort: Pulmonary effort is normal.     Breath sounds: Normal breath sounds. No wheezing, rhonchi or rales.  Musculoskeletal:     Right lower leg: No edema.     Left lower leg: No edema.  Skin:    General: Skin is warm and dry.  Neurological:     General: No focal deficit present.     Mental Status: She is alert.  Psychiatric:        Mood and Affect: Mood normal.        Behavior: Behavior normal.        Thought Content: Thought content normal.        Judgment: Judgment normal.     CBC    Component Value Date/Time   WBC 5.7 03/27/2019 1448   RBC 4.61 03/27/2019 1448   HGB 12.0 03/27/2019 1448   HCT 38.2 03/27/2019 1448   PLT 284 03/27/2019 1448   MCV 82.9 03/27/2019 1448   MCH 26.0 03/27/2019 1448   MCHC 31.4 03/27/2019 1448   RDW 15.8 (H) 03/27/2019 1448   LYMPHSABS 1.9 05/27/2017 0852   MONOABS 0.6 05/27/2017 0852   EOSABS 0.2 05/27/2017 0852   BASOSABS 0.0  05/27/2017 0852      Latest Ref Rng & Units 03/27/2019    2:48 PM 05/27/2017    8:52 AM 02/26/2016    4:14 PM  BMP  Glucose 70 - 99 mg/dL 99  92  02/28/2016   BUN 6 - 20 mg/dL 21  13  24    Creatinine 0.44 - 1.00 mg/dL 573   2.20   Sodium 135 - 145 mmol/L 138  139  137   Potassium 3.5 - 5.1 mmol/L 3.8  3.7  3.7   Chloride 98 - 111 mmol/L 106  108  104   CO2 22 - 32 mmol/L 23  21  24    Calcium 8.9 - 10.3 mg/dL 9.2  8.9  9.4    Chest imaging: CXR 03/27/19 The heart size and mediastinal contours are within normal limits. Both lungs are clear. The visualized skeletal structures are unremarkable.  PFT:    Latest Ref Rng & Units 09/10/2021   10:57 AM  PFT Results  FVC-Pre L 3.30   FVC-Predicted Pre % 83   FVC-Post L 3.41   FVC-Predicted Post % 86   Pre FEV1/FVC % % 87   Post FEV1/FCV % % 88   FEV1-Pre L 2.88   FEV1-Predicted Pre % 92   FEV1-Post L 2.99   DLCO uncorrected ml/min/mmHg 19.73   DLCO UNC% % 84   DLCO corrected ml/min/mmHg 19.73  DLCO COR %Predicted % 84   DLVA Predicted % 98   TLC L 5.31   TLC % Predicted % 96   RV % Predicted % 90     Labs:  Path:  Echo:  Heart Catheterization:  Assessment & Plan:   No diagnosis found.  Discussion: Jenny Hudson is a 51 year old woman, former smoker with seasonal allergies and asthma who returns to pulmonary clinic for asthma.   She has mild persistent asthma. She is to continue advair diksus 250-21mcg 1 puff twice daily. She can continue albuterol inhaler as needed.  She is to continue montelukast 10mg  daily and continue zyrtec daily.   Her PFTs are within normal limits. We will consider reducing her advair dosing at follow up visit if no issues.  Follow up in 1 year.  , MD [facility] Office: [phone]   Current Outpatient Medications:    albuterol (PROVENTIL HFA;VENTOLIN HFA) 108 (90 Base) MCG/ACT inhaler, Inhale into the lungs every 6 (six) hours as needed for  wheezing or shortness of breath., Disp: , Rfl:    cetirizine (ZYRTEC) 10 MG tablet, Take 10 mg by mouth daily., Disp: , Rfl:    cyclobenzaprine (FLEXERIL) 10 MG tablet, Take 10 mg by mouth at bedtime as needed., Disp: , Rfl:    docusate sodium (COLACE) 100 MG capsule, Take 1 capsule (100 mg total) by mouth every 12 (twelve) hours., Disp: 60 capsule, Rfl: 0   esomeprazole (NEXIUM) 40 MG capsule, Take 40 mg by mouth daily at 12 noon., Disp: , Rfl:    fluticasone-salmeterol (ADVAIR DISKUS) 250-50 MCG/ACT AEPB, Inhale 1 puff into the lungs in the morning and at bedtime., Disp: 180 each, Rfl: 6   gabapentin (NEURONTIN) 300 MG capsule, Take 600 mg by mouth 3 (three) times daily., Disp: , Rfl:    ibuprofen (ADVIL,MOTRIN) 600 MG tablet, TAKE 1 TABLET BY MOUTH FOUR TIMES A DAY FOR 5 DAYS THEN EVERY 6 HOURS IF NEEDED, Disp: , Rfl: 0   meloxicam (MOBIC) 7.5 MG tablet, Take 7.5 mg by mouth daily., Disp: , Rfl:    montelukast (SINGULAIR) 10 MG tablet, Take 1 tablet (10 mg total) by mouth at bedtime., Disp: 30 tablet, Rfl: 11   Multiple Vitamins-Minerals (MULTIVITAMIN WITH MINERALS) tablet, Take 1 tablet by mouth daily., Disp: , Rfl:    norethindrone-ethinyl estradiol-FE (LOESTRIN FE) 1-20 MG-MCG tablet, Take 1 tablet by mouth daily., Disp: , Rfl:

## 2021-09-10 NOTE — Progress Notes (Signed)
Full PFT performed today. °

## 2021-09-10 NOTE — Patient Instructions (Addendum)
Continue advair 250-54mcg 1 puff twice daily  Continue montelukast 10mg  each evening  Your breathing tests are within normal limits today  Continue xyzal OTC for allergies  Follow up in 1 year, call sooner if needed

## 2021-09-13 ENCOUNTER — Encounter: Payer: Self-pay | Admitting: Pulmonary Disease

## 2021-09-18 DIAGNOSIS — M5416 Radiculopathy, lumbar region: Secondary | ICD-10-CM | POA: Diagnosis not present

## 2021-12-25 DIAGNOSIS — J069 Acute upper respiratory infection, unspecified: Secondary | ICD-10-CM | POA: Diagnosis not present

## 2022-01-06 DIAGNOSIS — R509 Fever, unspecified: Secondary | ICD-10-CM | POA: Diagnosis not present

## 2022-01-06 DIAGNOSIS — J40 Bronchitis, not specified as acute or chronic: Secondary | ICD-10-CM | POA: Diagnosis not present

## 2022-03-05 DIAGNOSIS — M545 Low back pain, unspecified: Secondary | ICD-10-CM | POA: Diagnosis not present

## 2022-03-05 DIAGNOSIS — M5416 Radiculopathy, lumbar region: Secondary | ICD-10-CM | POA: Diagnosis not present

## 2022-03-05 DIAGNOSIS — M5136 Other intervertebral disc degeneration, lumbar region: Secondary | ICD-10-CM | POA: Diagnosis not present

## 2022-03-25 DIAGNOSIS — M5416 Radiculopathy, lumbar region: Secondary | ICD-10-CM | POA: Diagnosis not present

## 2022-04-13 DIAGNOSIS — Z01419 Encounter for gynecological examination (general) (routine) without abnormal findings: Secondary | ICD-10-CM | POA: Diagnosis not present

## 2022-04-13 DIAGNOSIS — Z0000 Encounter for general adult medical examination without abnormal findings: Secondary | ICD-10-CM | POA: Diagnosis not present

## 2022-04-13 DIAGNOSIS — I493 Ventricular premature depolarization: Secondary | ICD-10-CM | POA: Diagnosis not present

## 2022-04-13 DIAGNOSIS — N841 Polyp of cervix uteri: Secondary | ICD-10-CM | POA: Diagnosis not present

## 2022-04-21 DIAGNOSIS — M5416 Radiculopathy, lumbar region: Secondary | ICD-10-CM | POA: Diagnosis not present

## 2022-05-01 DIAGNOSIS — M545 Low back pain, unspecified: Secondary | ICD-10-CM | POA: Diagnosis not present

## 2022-05-05 DIAGNOSIS — M5416 Radiculopathy, lumbar region: Secondary | ICD-10-CM | POA: Diagnosis not present

## 2022-05-11 DIAGNOSIS — F419 Anxiety disorder, unspecified: Secondary | ICD-10-CM | POA: Diagnosis not present

## 2022-05-11 DIAGNOSIS — J45909 Unspecified asthma, uncomplicated: Secondary | ICD-10-CM | POA: Diagnosis not present

## 2022-06-16 DIAGNOSIS — M546 Pain in thoracic spine: Secondary | ICD-10-CM | POA: Diagnosis not present

## 2022-06-16 DIAGNOSIS — M5431 Sciatica, right side: Secondary | ICD-10-CM | POA: Diagnosis not present

## 2022-06-18 DIAGNOSIS — M5431 Sciatica, right side: Secondary | ICD-10-CM | POA: Diagnosis not present

## 2022-06-18 DIAGNOSIS — M546 Pain in thoracic spine: Secondary | ICD-10-CM | POA: Diagnosis not present

## 2022-06-22 DIAGNOSIS — M546 Pain in thoracic spine: Secondary | ICD-10-CM | POA: Diagnosis not present

## 2022-06-22 DIAGNOSIS — M5431 Sciatica, right side: Secondary | ICD-10-CM | POA: Diagnosis not present

## 2022-06-24 DIAGNOSIS — M546 Pain in thoracic spine: Secondary | ICD-10-CM | POA: Diagnosis not present

## 2022-06-24 DIAGNOSIS — M5431 Sciatica, right side: Secondary | ICD-10-CM | POA: Diagnosis not present

## 2022-06-29 DIAGNOSIS — M546 Pain in thoracic spine: Secondary | ICD-10-CM | POA: Diagnosis not present

## 2022-06-29 DIAGNOSIS — M5431 Sciatica, right side: Secondary | ICD-10-CM | POA: Diagnosis not present

## 2022-07-01 DIAGNOSIS — M5431 Sciatica, right side: Secondary | ICD-10-CM | POA: Diagnosis not present

## 2022-07-01 DIAGNOSIS — M546 Pain in thoracic spine: Secondary | ICD-10-CM | POA: Diagnosis not present

## 2022-07-02 DIAGNOSIS — M546 Pain in thoracic spine: Secondary | ICD-10-CM | POA: Diagnosis not present

## 2022-07-02 DIAGNOSIS — M5431 Sciatica, right side: Secondary | ICD-10-CM | POA: Diagnosis not present

## 2022-07-06 DIAGNOSIS — M546 Pain in thoracic spine: Secondary | ICD-10-CM | POA: Diagnosis not present

## 2022-07-06 DIAGNOSIS — M5431 Sciatica, right side: Secondary | ICD-10-CM | POA: Diagnosis not present

## 2022-07-08 DIAGNOSIS — M546 Pain in thoracic spine: Secondary | ICD-10-CM | POA: Diagnosis not present

## 2022-07-08 DIAGNOSIS — M5431 Sciatica, right side: Secondary | ICD-10-CM | POA: Diagnosis not present

## 2022-07-13 DIAGNOSIS — M5431 Sciatica, right side: Secondary | ICD-10-CM | POA: Diagnosis not present

## 2022-07-13 DIAGNOSIS — M546 Pain in thoracic spine: Secondary | ICD-10-CM | POA: Diagnosis not present

## 2022-07-15 DIAGNOSIS — M5431 Sciatica, right side: Secondary | ICD-10-CM | POA: Diagnosis not present

## 2022-07-15 DIAGNOSIS — M546 Pain in thoracic spine: Secondary | ICD-10-CM | POA: Diagnosis not present

## 2022-07-16 DIAGNOSIS — M5431 Sciatica, right side: Secondary | ICD-10-CM | POA: Diagnosis not present

## 2022-07-16 DIAGNOSIS — M546 Pain in thoracic spine: Secondary | ICD-10-CM | POA: Diagnosis not present

## 2022-07-21 DIAGNOSIS — M546 Pain in thoracic spine: Secondary | ICD-10-CM | POA: Diagnosis not present

## 2022-07-21 DIAGNOSIS — M5431 Sciatica, right side: Secondary | ICD-10-CM | POA: Diagnosis not present

## 2022-07-23 DIAGNOSIS — M5431 Sciatica, right side: Secondary | ICD-10-CM | POA: Diagnosis not present

## 2022-07-23 DIAGNOSIS — M546 Pain in thoracic spine: Secondary | ICD-10-CM | POA: Diagnosis not present

## 2022-07-28 DIAGNOSIS — M546 Pain in thoracic spine: Secondary | ICD-10-CM | POA: Diagnosis not present

## 2022-07-28 DIAGNOSIS — M5431 Sciatica, right side: Secondary | ICD-10-CM | POA: Diagnosis not present

## 2022-07-31 DIAGNOSIS — M546 Pain in thoracic spine: Secondary | ICD-10-CM | POA: Diagnosis not present

## 2022-07-31 DIAGNOSIS — M5431 Sciatica, right side: Secondary | ICD-10-CM | POA: Diagnosis not present

## 2022-08-12 DIAGNOSIS — M5431 Sciatica, right side: Secondary | ICD-10-CM | POA: Diagnosis not present

## 2022-08-12 DIAGNOSIS — M546 Pain in thoracic spine: Secondary | ICD-10-CM | POA: Diagnosis not present

## 2022-09-11 ENCOUNTER — Ambulatory Visit: Payer: BC Managed Care – PPO | Admitting: Pulmonary Disease

## 2022-09-11 NOTE — Progress Notes (Deleted)
Synopsis: Referred in June 2023 for asthma by Kirby Funk, MD  Subjective:   PATIENT ID: Jenny Hudson GENDER: female DOB: [birth year], MRN: [id number]  HPI  No chief complaint on file.  Jenny Hudson is a 52 year old woman, former smoker with seasonal allergies and asthma who returns to pulmonary clinic for asthma.     OV 09/10/21 She has been doing well on advair 250-73mcg 1 puff twice daily since last visit along with montelukast 10mg  daily. She has continued on zyrtec. She has used her rescue inhaler only a couple of times.   Her PFTs are within normal limits today.   They are nearing completing of their new house in [place].   OV 06/10/21 She reports having exercise induced asthma since childhood which was progressed to more persistent asthma over the years. She has issues with allergies that affect her breathing. Spring time can be the most difficult season. She does have sinus congestion with post-nasal drianage. She does not like to use nasal sprays. She has cough and is constantly clearing her throat. She feels her throat is always congested. She has occasional wheezing. She as no night time awakenings.   She is currently using flovent 2 puffs twice daily and as needed albuterol. She is using albuterol about once daily. She is using zyrtec for allergies. She was previously on montelukast.   Her GERD is well controlled since being on esomeprazole.   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 She has 20 pack year history and quit in 2011/10/14. Smoked 1.5 packs for many years. She did vape for 3 years when she was trying to quit. No hobbies with significant dust or chemical exposures. She has a Development worker, international aid at home. She has allergy to cats.   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 animals   Epilepsy (HCC)    as child; has been on phenobarbital and  tegretal in the past, at around 52 yo, had repeat EEG which was normal, and was tapered off seizure medications   Erosive gastritis 03/15/2012   Family history of breast cancer    Family history of melanoma    Family history of stomach cancer    Pulse irregularity    Seasonal allergies      Family History  Problem Relation Age of Onset   Breast cancer Mother 63       passed away in 10-14-2003   Hypertension Father    Hyperlipidemia Father    Asthma Father        heavy smoker   GER disease Father    Stomach cancer Maternal Uncle        late 40s-early 49s   Lung cancer Paternal Uncle        smoker   Melanoma Maternal Grandmother        dx in her late 70s-early 64s     Social History   Socioeconomic History   Marital status: Married    Spouse name: Trieste Wooters   Number of children: 4   Years of education: RN   Highest education level: Not on file  Occupational History   Occupation: RN [employer]    Employer: [employer]    Employer: health system management  Tobacco Use   Smoking status: Former    Current packs/day: 0.00  Average packs/day: 1 pack/day for 20.0 years (20.0 ttl pk-yrs)    Types: Cigarettes, E-cigarettes    Start date: 08/08/1991    Quit date: 08/08/2011    Years since quitting: 11.1   Smokeless tobacco: Never   Tobacco comments:    quit jan 2012  Substance and Sexual Activity   Alcohol use: Yes    Comment: occasionally    Drug use: No   Sexual activity: Not on file  Other Topics Concern   Not on file  Social History Narrative   Not on file   Social Determinants of Health   Financial Resource Strain: Not on file  Food Insecurity: Not on file  Transportation Needs: Not on file  Physical Activity: Not on file  Stress: Not on file  Social Connections: Not on file  Intimate Partner Violence: Not on file     Allergies  Allergen Reactions   Sulfa Antibiotics Rash     Outpatient Medications Prior to Visit  Medication Sig Dispense Refill    albuterol (PROVENTIL HFA;VENTOLIN HFA) 108 (90 Base) MCG/ACT inhaler Inhale into the lungs every 6 (six) hours as needed for wheezing or shortness of breath.     cetirizine (ZYRTEC) 10 MG tablet Take 10 mg by mouth daily.     cyclobenzaprine (FLEXERIL) 10 MG tablet Take 10 mg by mouth at bedtime as needed.     docusate sodium (COLACE) 100 MG capsule Take 1 capsule (100 mg total) by mouth every 12 (twelve) hours. 60 capsule 0   esomeprazole (NEXIUM) 40 MG capsule Take 40 mg by mouth daily at 12 noon.     fluticasone-salmeterol (ADVAIR DISKUS) 250-50 MCG/ACT AEPB Inhale 1 puff into the lungs in the morning and at bedtime. 180 each 6   gabapentin (NEURONTIN) 300 MG capsule Take 600 mg by mouth 3 (three) times daily.     ibuprofen (ADVIL,MOTRIN) 600 MG tablet TAKE 1 TABLET BY MOUTH FOUR TIMES A DAY FOR 5 DAYS THEN EVERY 6 HOURS IF NEEDED  0   meloxicam (MOBIC) 7.5 MG tablet Take 7.5 mg by mouth daily.     montelukast (SINGULAIR) 10 MG tablet Take 1 tablet (10 mg total) by mouth at bedtime. 30 tablet 11   Multiple Vitamins-Minerals (MULTIVITAMIN WITH MINERALS) tablet Take 1 tablet by mouth daily.     norethindrone-ethinyl estradiol-FE (LOESTRIN FE) 1-20 MG-MCG tablet Take 1 tablet by mouth daily.     No facility-administered medications prior to visit.   Review of Systems  Constitutional:  Negative for chills, fever, malaise/fatigue and weight loss.  HENT:  Negative for congestion, sinus pain and sore throat.   Eyes: Negative.   Respiratory:  Negative for cough, hemoptysis, sputum production, shortness of breath and wheezing.   Cardiovascular:  Negative for chest pain, palpitations, orthopnea, claudication and leg swelling.  Gastrointestinal:  Negative for abdominal pain, heartburn, nausea and vomiting.  Genitourinary: Negative.   Musculoskeletal:  Negative for joint pain and myalgias.  Skin:  Negative for rash.  Neurological:  Negative for weakness and headaches.  Endo/Heme/Allergies: Negative.    Psychiatric/Behavioral: Negative.      Objective:   There were no vitals filed for this visit.  Physical Exam Constitutional:      General: She is not in acute distress.    Appearance: She is not ill-appearing.  HENT:     Head: Normocephalic and atraumatic.  Eyes:     General: No scleral icterus. Cardiovascular:     Rate and Rhythm: Normal rate and regular rhythm.  Pulses: Normal pulses.     Heart sounds: Normal heart sounds. No murmur heard. Pulmonary:     Effort: Pulmonary effort is normal.     Breath sounds: Normal breath sounds. No wheezing, rhonchi or rales.  Musculoskeletal:     Right lower leg: No edema.     Left lower leg: No edema.  Skin:    General: Skin is warm and dry.  Neurological:     General: No focal deficit present.     Mental Status: She is alert.  Psychiatric:        Mood and Affect: Mood normal.        Behavior: Behavior normal.        Thought Content: Thought content normal.        Judgment: Judgment normal.     CBC    Component Value Date/Time   WBC 5.7 03/27/2019 1448   RBC 4.61 03/27/2019 1448   HGB 12.0 03/27/2019 1448   HCT 38.2 03/27/2019 1448   PLT 284 03/27/2019 1448   MCV 82.9 03/27/2019 1448   MCH 26.0 03/27/2019 1448   MCHC 31.4 03/27/2019 1448   RDW 15.8 (H) 03/27/2019 1448   LYMPHSABS 1.9 05/27/2017 0852   MONOABS 0.6 05/27/2017 0852   EOSABS 0.2 05/27/2017 0852   BASOSABS 0.0 05/27/2017 0852      Latest Ref Rng & Units 03/27/2019    2:48 PM 05/27/2017    8:52 AM 02/26/2016    4:14 PM  BMP  Glucose 70 - 99 mg/dL 99  92  161   BUN 6 - 20 mg/dL 21  13  24    Creatinine 0.44 - 1.00 mg/dL 0.96  0.45  4.09   Sodium 135 - 145 mmol/L 138  139  137   Potassium 3.5 - 5.1 mmol/L 3.8  3.7  3.7   Chloride 98 - 111 mmol/L 106  108  104   CO2 22 - 32 mmol/L 23  21  24    Calcium 8.9 - 10.3 mg/dL 9.2  8.9  9.4    Chest imaging: CXR 03/27/19 The heart size and mediastinal contours are within normal limits. Both lungs are  clear. The visualized skeletal structures are unremarkable.  PFT:    Latest Ref Rng & Units 09/10/2021   10:57 AM  PFT Results  FVC-Pre L 3.30   FVC-Predicted Pre % 83   FVC-Post L 3.41   FVC-Predicted Post % 86   Pre FEV1/FVC % % 87   Post FEV1/FCV % % 88   FEV1-Pre L 2.88   FEV1-Predicted Pre % 92   FEV1-Post L 2.99   DLCO uncorrected ml/min/mmHg 19.73   DLCO UNC% % 84   DLCO corrected ml/min/mmHg 19.73   DLCO COR %Predicted % 84   DLVA Predicted % 98   TLC L 5.31   TLC % Predicted % 96   RV % Predicted % 90     Labs:  Path:  Echo:  Heart Catheterization:  Assessment & Plan:   No diagnosis found.  Discussion: Jenny Hudson is a 52 year old woman, former smoker with seasonal allergies and asthma who returns to pulmonary clinic for asthma.   She has mild persistent asthma. She is to continue advair diksus 250-15mcg 1 puff twice daily. She can continue albuterol inhaler as needed.  She is to continue montelukast 10mg  daily and continue zyrtec daily.   Her PFTs are within normal limits. We will consider reducing her advair dosing at follow up visit if  no issues.  Follow up in 1 year.  Melody Comas, MD [facility] Office: [phone]   Current Outpatient Medications:    albuterol (PROVENTIL HFA;VENTOLIN HFA) 108 (90 Base) MCG/ACT inhaler, Inhale into the lungs every 6 (six) hours as needed for wheezing or shortness of breath., Disp: , Rfl:    cetirizine (ZYRTEC) 10 MG tablet, Take 10 mg by mouth daily., Disp: , Rfl:    cyclobenzaprine (FLEXERIL) 10 MG tablet, Take 10 mg by mouth at bedtime as needed., Disp: , Rfl:    docusate sodium (COLACE) 100 MG capsule, Take 1 capsule (100 mg total) by mouth every 12 (twelve) hours., Disp: 60 capsule, Rfl: 0   esomeprazole (NEXIUM) 40 MG capsule, Take 40 mg by mouth daily at 12 noon., Disp: , Rfl:    fluticasone-salmeterol (ADVAIR DISKUS) 250-50 MCG/ACT AEPB, Inhale 1 puff into the lungs in the  morning and at bedtime., Disp: 180 each, Rfl: 6   gabapentin (NEURONTIN) 300 MG capsule, Take 600 mg by mouth 3 (three) times daily., Disp: , Rfl:    ibuprofen (ADVIL,MOTRIN) 600 MG tablet, TAKE 1 TABLET BY MOUTH FOUR TIMES A DAY FOR 5 DAYS THEN EVERY 6 HOURS IF NEEDED, Disp: , Rfl: 0   meloxicam (MOBIC) 7.5 MG tablet, Take 7.5 mg by mouth daily., Disp: , Rfl:    montelukast (SINGULAIR) 10 MG tablet, Take 1 tablet (10 mg total) by mouth at bedtime., Disp: 30 tablet, Rfl: 11   Multiple Vitamins-Minerals (MULTIVITAMIN WITH MINERALS) tablet, Take 1 tablet by mouth daily., Disp: , Rfl:    norethindrone-ethinyl estradiol-FE (LOESTRIN FE) 1-20 MG-MCG tablet, Take 1 tablet by mouth daily., Disp: , Rfl:

## 2022-09-14 DIAGNOSIS — R070 Pain in throat: Secondary | ICD-10-CM | POA: Diagnosis not present

## 2022-09-14 DIAGNOSIS — R051 Acute cough: Secondary | ICD-10-CM | POA: Diagnosis not present

## 2022-09-14 DIAGNOSIS — R509 Fever, unspecified: Secondary | ICD-10-CM | POA: Diagnosis not present

## 2022-09-21 ENCOUNTER — Encounter: Payer: Self-pay | Admitting: Pulmonary Disease

## 2022-10-22 DIAGNOSIS — L508 Other urticaria: Secondary | ICD-10-CM | POA: Diagnosis not present

## 2022-10-22 DIAGNOSIS — R21 Rash and other nonspecific skin eruption: Secondary | ICD-10-CM | POA: Diagnosis not present

## 2022-11-02 DIAGNOSIS — M5431 Sciatica, right side: Secondary | ICD-10-CM | POA: Diagnosis not present

## 2022-11-02 DIAGNOSIS — M546 Pain in thoracic spine: Secondary | ICD-10-CM | POA: Diagnosis not present

## 2023-01-28 ENCOUNTER — Other Ambulatory Visit: Payer: Self-pay | Admitting: Obstetrics and Gynecology

## 2023-01-28 DIAGNOSIS — Z1231 Encounter for screening mammogram for malignant neoplasm of breast: Secondary | ICD-10-CM

## 2023-02-09 ENCOUNTER — Ambulatory Visit: Payer: BC Managed Care – PPO

## 2023-04-22 DIAGNOSIS — N951 Menopausal and female climacteric states: Secondary | ICD-10-CM | POA: Diagnosis not present

## 2023-04-22 DIAGNOSIS — F418 Other specified anxiety disorders: Secondary | ICD-10-CM | POA: Diagnosis not present

## 2023-04-22 DIAGNOSIS — Z0000 Encounter for general adult medical examination without abnormal findings: Secondary | ICD-10-CM | POA: Diagnosis not present

## 2023-04-22 DIAGNOSIS — M5126 Other intervertebral disc displacement, lumbar region: Secondary | ICD-10-CM | POA: Diagnosis not present

## 2023-04-22 DIAGNOSIS — J4530 Mild persistent asthma, uncomplicated: Secondary | ICD-10-CM | POA: Diagnosis not present

## 2023-05-14 DIAGNOSIS — M5442 Lumbago with sciatica, left side: Secondary | ICD-10-CM | POA: Diagnosis not present

## 2023-05-14 DIAGNOSIS — M5441 Lumbago with sciatica, right side: Secondary | ICD-10-CM | POA: Diagnosis not present

## 2023-05-14 DIAGNOSIS — G8929 Other chronic pain: Secondary | ICD-10-CM | POA: Diagnosis not present

## 2023-05-26 DIAGNOSIS — J3081 Allergic rhinitis due to animal (cat) (dog) hair and dander: Secondary | ICD-10-CM | POA: Diagnosis not present

## 2023-05-26 DIAGNOSIS — J45998 Other asthma: Secondary | ICD-10-CM | POA: Diagnosis not present

## 2023-05-26 DIAGNOSIS — J301 Allergic rhinitis due to pollen: Secondary | ICD-10-CM | POA: Diagnosis not present

## 2023-05-26 DIAGNOSIS — J4520 Mild intermittent asthma, uncomplicated: Secondary | ICD-10-CM | POA: Diagnosis not present

## 2023-05-26 DIAGNOSIS — J45909 Unspecified asthma, uncomplicated: Secondary | ICD-10-CM | POA: Diagnosis not present

## 2023-05-26 DIAGNOSIS — R052 Subacute cough: Secondary | ICD-10-CM | POA: Diagnosis not present

## 2023-06-02 DIAGNOSIS — M5442 Lumbago with sciatica, left side: Secondary | ICD-10-CM | POA: Diagnosis not present

## 2023-06-08 ENCOUNTER — Ambulatory Visit
Admission: RE | Admit: 2023-06-08 | Discharge: 2023-06-08 | Disposition: A | Discharge status: Home / Self Care | Source: Ambulatory Visit | Attending: Obstetrics and Gynecology | Admitting: Obstetrics and Gynecology

## 2023-06-08 DIAGNOSIS — Z1231 Encounter for screening mammogram for malignant neoplasm of breast: Secondary | ICD-10-CM | POA: Diagnosis not present

## 2023-06-08 DIAGNOSIS — J3081 Allergic rhinitis due to animal (cat) (dog) hair and dander: Secondary | ICD-10-CM | POA: Diagnosis not present

## 2023-06-08 DIAGNOSIS — J301 Allergic rhinitis due to pollen: Secondary | ICD-10-CM | POA: Diagnosis not present

## 2023-06-09 DIAGNOSIS — J301 Allergic rhinitis due to pollen: Secondary | ICD-10-CM | POA: Diagnosis not present

## 2023-06-09 DIAGNOSIS — J3081 Allergic rhinitis due to animal (cat) (dog) hair and dander: Secondary | ICD-10-CM | POA: Diagnosis not present

## 2023-06-09 DIAGNOSIS — J3089 Other allergic rhinitis: Secondary | ICD-10-CM | POA: Diagnosis not present

## 2023-06-11 DIAGNOSIS — J3089 Other allergic rhinitis: Secondary | ICD-10-CM | POA: Diagnosis not present

## 2023-06-11 DIAGNOSIS — J301 Allergic rhinitis due to pollen: Secondary | ICD-10-CM | POA: Diagnosis not present

## 2023-06-11 DIAGNOSIS — J3081 Allergic rhinitis due to animal (cat) (dog) hair and dander: Secondary | ICD-10-CM | POA: Diagnosis not present

## 2023-06-15 ENCOUNTER — Other Ambulatory Visit: Payer: Self-pay | Admitting: Obstetrics and Gynecology

## 2023-06-15 DIAGNOSIS — R928 Other abnormal and inconclusive findings on diagnostic imaging of breast: Secondary | ICD-10-CM

## 2023-06-15 DIAGNOSIS — J301 Allergic rhinitis due to pollen: Secondary | ICD-10-CM | POA: Diagnosis not present

## 2023-06-15 DIAGNOSIS — J3081 Allergic rhinitis due to animal (cat) (dog) hair and dander: Secondary | ICD-10-CM | POA: Diagnosis not present

## 2023-06-15 DIAGNOSIS — J3089 Other allergic rhinitis: Secondary | ICD-10-CM | POA: Diagnosis not present

## 2023-06-17 DIAGNOSIS — J3089 Other allergic rhinitis: Secondary | ICD-10-CM | POA: Diagnosis not present

## 2023-06-17 DIAGNOSIS — J301 Allergic rhinitis due to pollen: Secondary | ICD-10-CM | POA: Diagnosis not present

## 2023-06-17 DIAGNOSIS — J3081 Allergic rhinitis due to animal (cat) (dog) hair and dander: Secondary | ICD-10-CM | POA: Diagnosis not present

## 2023-06-18 DIAGNOSIS — Z01419 Encounter for gynecological examination (general) (routine) without abnormal findings: Secondary | ICD-10-CM | POA: Diagnosis not present

## 2023-06-22 ENCOUNTER — Encounter

## 2023-06-22 ENCOUNTER — Ambulatory Visit
Admission: RE | Admit: 2023-06-22 | Discharge: 2023-06-22 | Discharge status: Home / Self Care | Source: Ambulatory Visit | Attending: Obstetrics and Gynecology

## 2023-06-22 ENCOUNTER — Ambulatory Visit
Admission: RE | Admit: 2023-06-22 | Discharge: 2023-06-22 | Disposition: A | Discharge status: Home / Self Care | Source: Ambulatory Visit | Attending: Obstetrics and Gynecology | Admitting: Obstetrics and Gynecology

## 2023-06-22 ENCOUNTER — Other Ambulatory Visit: Payer: Self-pay | Admitting: Obstetrics and Gynecology

## 2023-06-22 ENCOUNTER — Other Ambulatory Visit

## 2023-06-22 DIAGNOSIS — N6320 Unspecified lump in the left breast, unspecified quadrant: Secondary | ICD-10-CM

## 2023-06-22 DIAGNOSIS — R928 Other abnormal and inconclusive findings on diagnostic imaging of breast: Secondary | ICD-10-CM

## 2023-06-22 DIAGNOSIS — C50412 Malignant neoplasm of upper-outer quadrant of left female breast: Secondary | ICD-10-CM | POA: Diagnosis not present

## 2023-06-22 DIAGNOSIS — N926 Irregular menstruation, unspecified: Secondary | ICD-10-CM | POA: Diagnosis not present

## 2023-06-22 DIAGNOSIS — N6321 Unspecified lump in the left breast, upper outer quadrant: Secondary | ICD-10-CM | POA: Diagnosis not present

## 2023-06-22 DIAGNOSIS — Z170 Estrogen receptor positive status [ER+]: Secondary | ICD-10-CM | POA: Diagnosis not present

## 2023-06-22 HISTORY — PX: BREAST BIOPSY: SHX20

## 2023-06-23 LAB — SURGICAL PATHOLOGY

## 2023-06-24 ENCOUNTER — Telehealth: Payer: Self-pay | Admitting: *Deleted

## 2023-06-24 NOTE — Telephone Encounter (Signed)
 Spoke to patient to confirm upcoming morning [facility] clinic appointment on 6/25, paperwork will be sent via mail.  Gave location and time, also informed patient that the surgeon's office would be calling as well to get information from them similar to the packet that they will be receiving so make sure to do both.  Reminded patient that all providers will be coming to the clinic to see them HERE and if they had any questions to not hesitate to reach back out to myself or their navigators.

## 2023-06-25 DIAGNOSIS — J301 Allergic rhinitis due to pollen: Secondary | ICD-10-CM | POA: Diagnosis not present

## 2023-06-25 DIAGNOSIS — J3081 Allergic rhinitis due to animal (cat) (dog) hair and dander: Secondary | ICD-10-CM | POA: Diagnosis not present

## 2023-06-25 DIAGNOSIS — J3089 Other allergic rhinitis: Secondary | ICD-10-CM | POA: Diagnosis not present

## 2023-06-29 ENCOUNTER — Encounter: Payer: Self-pay | Admitting: *Deleted

## 2023-06-29 DIAGNOSIS — C50412 Malignant neoplasm of upper-outer quadrant of left female breast: Secondary | ICD-10-CM | POA: Insufficient documentation

## 2023-06-30 ENCOUNTER — Inpatient Hospital Stay: Admitting: Hematology and Oncology

## 2023-06-30 ENCOUNTER — Inpatient Hospital Stay: Attending: Hematology and Oncology

## 2023-06-30 ENCOUNTER — Ambulatory Visit
Admission: RE | Admit: 2023-06-30 | Discharge: 2023-06-30 | Disposition: A | Discharge status: Home / Self Care | Source: Ambulatory Visit | Attending: Radiation Oncology | Admitting: Radiation Oncology

## 2023-06-30 ENCOUNTER — Inpatient Hospital Stay: Admitting: Licensed Clinical Social Worker

## 2023-06-30 ENCOUNTER — Other Ambulatory Visit: Payer: Self-pay | Admitting: *Deleted

## 2023-06-30 ENCOUNTER — Encounter: Payer: Self-pay | Admitting: *Deleted

## 2023-06-30 VITALS — BP 130/78 | HR 84 | Temp 98.0°F | Resp 18 | Wt 205.0 lb

## 2023-06-30 DIAGNOSIS — Z8249 Family history of ischemic heart disease and other diseases of the circulatory system: Secondary | ICD-10-CM

## 2023-06-30 DIAGNOSIS — Z800 Family history of malignant neoplasm of digestive organs: Secondary | ICD-10-CM | POA: Insufficient documentation

## 2023-06-30 DIAGNOSIS — Z87891 Personal history of nicotine dependence: Secondary | ICD-10-CM | POA: Diagnosis not present

## 2023-06-30 DIAGNOSIS — Z825 Family history of asthma and other chronic lower respiratory diseases: Secondary | ICD-10-CM | POA: Diagnosis not present

## 2023-06-30 DIAGNOSIS — N644 Mastodynia: Secondary | ICD-10-CM

## 2023-06-30 DIAGNOSIS — Z83438 Family history of other disorder of lipoprotein metabolism and other lipidemia: Secondary | ICD-10-CM

## 2023-06-30 DIAGNOSIS — C50412 Malignant neoplasm of upper-outer quadrant of left female breast: Secondary | ICD-10-CM | POA: Diagnosis not present

## 2023-06-30 DIAGNOSIS — Z882 Allergy status to sulfonamides status: Secondary | ICD-10-CM | POA: Diagnosis not present

## 2023-06-30 DIAGNOSIS — Z1732 Human epidermal growth factor receptor 2 negative status: Secondary | ICD-10-CM | POA: Diagnosis not present

## 2023-06-30 DIAGNOSIS — Z801 Family history of malignant neoplasm of trachea, bronchus and lung: Secondary | ICD-10-CM

## 2023-06-30 DIAGNOSIS — Z79899 Other long term (current) drug therapy: Secondary | ICD-10-CM

## 2023-06-30 DIAGNOSIS — Z1721 Progesterone receptor positive status: Secondary | ICD-10-CM | POA: Diagnosis not present

## 2023-06-30 DIAGNOSIS — Z803 Family history of malignant neoplasm of breast: Secondary | ICD-10-CM

## 2023-06-30 DIAGNOSIS — Z808 Family history of malignant neoplasm of other organs or systems: Secondary | ICD-10-CM | POA: Diagnosis not present

## 2023-06-30 DIAGNOSIS — Z170 Estrogen receptor positive status [ER+]: Secondary | ICD-10-CM | POA: Diagnosis not present

## 2023-06-30 LAB — CBC WITH DIFFERENTIAL (CANCER CENTER ONLY)
Abs Immature Granulocytes: 0.02 10*3/uL (ref 0.00–0.07)
Basophils Absolute: 0.1 10*3/uL (ref 0.0–0.1)
Basophils Relative: 1 %
Eosinophils Absolute: 0.3 10*3/uL (ref 0.0–0.5)
Eosinophils Relative: 4 %
HCT: 38.7 % (ref 36.0–46.0)
Hemoglobin: 12.5 g/dL (ref 12.0–15.0)
Immature Granulocytes: 0 %
Lymphocytes Relative: 31 %
Lymphs Abs: 2.1 10*3/uL (ref 0.7–4.0)
MCH: 27.5 pg (ref 26.0–34.0)
MCHC: 32.3 g/dL (ref 30.0–36.0)
MCV: 85.2 fL (ref 80.0–100.0)
Monocytes Absolute: 0.4 10*3/uL (ref 0.1–1.0)
Monocytes Relative: 6 %
Neutro Abs: 3.9 10*3/uL (ref 1.7–7.7)
Neutrophils Relative %: 58 %
Platelet Count: 280 10*3/uL (ref 150–400)
RBC: 4.54 MIL/uL (ref 3.87–5.11)
RDW: 14 % (ref 11.5–15.5)
WBC Count: 6.8 10*3/uL (ref 4.0–10.5)
nRBC: 0 % (ref 0.0–0.2)

## 2023-06-30 LAB — CMP (CANCER CENTER ONLY)
ALT: 14 U/L (ref 0–44)
AST: 15 U/L (ref 15–41)
Albumin: 4.5 g/dL (ref 3.5–5.0)
Alkaline Phosphatase: 50 U/L (ref 38–126)
Anion gap: 6 (ref 5–15)
BUN: 14 mg/dL (ref 6–20)
CO2: 27 mmol/L (ref 22–32)
Calcium: 9 mg/dL (ref 8.9–10.3)
Chloride: 104 mmol/L (ref 98–111)
Creatinine: 0.75 mg/dL (ref 0.44–1.00)
GFR, Estimated: >60 mL/min (ref >60)
Glucose, Bld: 148 mg/dL — ABNORMAL HIGH (ref 70–99)
Potassium: 3.9 mmol/L (ref 3.5–5.1)
Sodium: 137 mmol/L (ref 135–145)
Total Bilirubin: 0.3 mg/dL (ref 0.0–1.2)
Total Protein: 7.5 g/dL (ref 6.5–8.1)

## 2023-06-30 LAB — GENETIC SCREENING ORDER

## 2023-06-30 LAB — RESEARCH LABS

## 2023-06-30 NOTE — Research (Signed)
 [employer] 2021-05 - Specimen Collection Study to Evaluate Biomarkers in Subjects with Cancer   Patient Jenny Hudson was identified by Dr. Gudena as a potential candidate for the above listed study.  This Clinical Research Nurse met with Jenny Hudson, [id number], on 06/30/23 in a manner and location that ensures patient privacy to discuss participation in the above listed research study.  Patient is Accompanied by her husband.  A copy of the informed consent document with embedded HIPAA language was provided to the patient.  Patient reads, speaks, and understands [place].    Patient was provided with the business card of this Nurse and encouraged to contact the research team with any questions.  Patient was provided the option of taking informed consent documents home to review and was encouraged to review at their convenience with their support network, including other care providers. Patient is comfortable with making a decision regarding study participation today.  As outlined in the informed consent form, this Nurse and Rocky SHAUNNA Nevin discussed the purpose of the research study, the investigational nature of the study, study procedures and requirements for study participation, potential risks and benefits of study participation, as well as alternatives to participation. This study is not blinded. The patient understands participation is voluntary and they may withdraw from study participation at any time.  This study does not involve randomization.  This study does not involve an investigational drug or device. This study does not involve a placebo. Patient understands enrollment is pending full eligibility review.   Confidentiality and how the patient's information will be used as part of study participation were discussed.  Patient was informed there is reimbursement provided for their time and effort spent on trial participation.    All questions were answered to patient's  satisfaction.  The informed consent with embedded HIPAA language was reviewed page by page.  The patient's mental and emotional status is appropriate to provide informed consent, and the patient verbalizes an understanding of study participation.  Patient has agreed to participate in the above listed research study and has voluntarily signed the informed consent dated 03 Feb 2021 version with embedded HIPAA language, version dated 03 Feb 2021 on 06/30/23 at 11:00AM.  The patient was provided with a copy of the signed informed consent form with embedded HIPAA language for their reference.  No study specific procedures were obtained prior to the signing of the informed consent document.  Approximately 30 minutes were spent with the patient reviewing the informed consent documents.  Patient was not requested to complete a Release of Information form.  Eligibility This Nurse has reviewed this patient's inclusion and exclusion criteria and confirmed Jenny Hudson is eligible for study participation.  Patient will continue with enrollment.  Eligibility confirmed by treating investigator, who also agrees that patient should proceed with enrollment.  Medical History:  High Blood Pressure  No Coronary Artery Disease No Lupus    No Rheumatoid Arthritis  No Diabetes   No      Lynch Syndrome  No  Is the patient currently taking a magnesium supplement?   No  Does the patient have a personal history of cancer (greater than 5 years ago)?  No  Does the patient have a family history of cancer in 1st or 2nd degree relatives? Yes If yes, Relationship(s) and Cancer type(s)? Mom-breast, maternal uncle-stomach cancer, maternal grandmother melanoma, paternal grandmother-colon, dad brother lung cancer  Does the patient have history of alcohol consumption? Yes   If yes, current  or former? current Number of years? Since age [age] Drinks per week? 2  Does the patient have history of cigarette, cigar, pipe, or  chewing tobacco use?  Yes  If yes, current for former? former If yes, type (Cigarette, cigar, pipe, and/or chewing tobacco)? cigarettes If former, year stopped? 13 years ago Number of years? 20  Packs/number/containers per day? A pack and half per day when she did smoke  Now Vapes  Gift Card Pt was provided a $50 gift card per study protocol.   Blood Draw Pt Blood drawn via fresh venipuncture in cancer center lab without difficulty.   Pt was thanked for her time and participation in this study. Pt was provided with contact information for research RN for questions or concerns.   Delon Pinal BSN RN Clinical Research Nurse [facility] Direct Dial: [phone] 06/30/2023  1:25 PM

## 2023-06-30 NOTE — Progress Notes (Signed)
 Radiation Oncology         [phone] ________________________________  Multidisciplinary Breast Oncology Clinic [facility]) Initial Outpatient Consultation  Name: Jenny Hudson MRN: [id number]  Date: 06/30/2023  DOB: 05/13/1970  RR:Mjgl, Trula SHAUNNA, MD  Ebbie Cough, MD   REFERRING PHYSICIAN: Ebbie Cough, MD  DIAGNOSIS: The encounter diagnosis was Malignant neoplasm of upper-outer quadrant of left breast in female, estrogen receptor positive (HCC).  Stage IA (cT1c, cN0, cM0) Left Breast UOQ, Invasive and in situ ductal carcinoma, ER+ / PR+ / Her2-, Grade 2    ICD-10-CM   1. Malignant neoplasm of upper-outer quadrant of left breast in female, estrogen receptor positive (HCC)  C50.412    Z17.0       HISTORY OF PRESENT ILLNESS::Jenny Hudson is a 53 y.o. female who is presenting to the office today for evaluation of her newly diagnosed breast cancer. She is accompanied by her sister and husband. She is doing well overall.   She had routine screening mammography on 06/08/23 showing a possible abnormality in the left breast. She underwent left breast diagnostic mammography with tomography and left breast breast ultrasonography at [facility] on 06/22/23 showing: a suspicious 1.1 cm mass in the 2 o'clock left breast located 7 cmfn. No abnormal left axillary lymph nodes were demonstrated.  Biopsy of the 2 o'clock left breast on 06/21/13 showed: grade 2 invasive ductal carcinoma measuring 7 mm in the greatest linear extent of the sample with intermediate grade DCIS. Prognostic indicators significant for: estrogen receptor, 99% positive and progesterone receptor, 100% positive, both with strong staining intensity. Proliferation marker Ki67 at 2%. HER2 negative.  Menarche: 53 years old Age at first live birth: 53 years old GP: 4 LMP: on 06/09/23 Contraceptive: yes, for 20 years (type not specified) HRT: denies use on provided form, but reports that she is taking estradiol    The patient was referred today for presentation in the multidisciplinary conference.  Radiology studies and pathology slides were presented there for review and discussion of treatment options.  A consensus was discussed regarding potential next steps.  PREVIOUS RADIATION THERAPY: No  PAST MEDICAL HISTORY:  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 animals   Epilepsy (HCC)    as child; has been on phenobarbital and tegretal in the past, at around 53 yo, had repeat EEG which was normal, and was tapered off seizure medications   Erosive gastritis 03/15/2012   Family history of breast cancer    Family history of melanoma    Family history of stomach cancer    Pulse irregularity    Seasonal allergies     PAST SURGICAL HISTORY: Past Surgical History:  Procedure Laterality Date   BREAST BIOPSY     BREAST BIOPSY Left 06/22/2023   US  LT BREAST BX W LOC DEV 1ST LESION IMG BX SPEC US  GUIDE 06/22/2023 [facility] MAMMOGRAPHY   DENTAL SURGERY  05/23/2017   DILATION AND CURETTAGE OF UTERUS  2008   ESOPHAGOGASTRODUODENOSCOPY N/A 03/15/2012   Procedure: ESOPHAGOGASTRODUODENOSCOPY (EGD);  Surgeon: Lupita FORBES Commander, MD;  Location: [facility] ENDOSCOPY;  Service: Endoscopy;  Laterality: N/A;   HAND SURGERY  2001   repair of tendons, nerves after glass laceerated her hand.    HERNIA REPAIR  age 38   right inguinal.    TUBAL LIGATION  2000    FAMILY HISTORY:  Family History  Problem Relation Age of Onset  Breast cancer Mother 60       passed away in 07/25/2003   Hypertension Father    Hyperlipidemia Father    Asthma Father        heavy smoker   GER disease Father    Stomach cancer Maternal Uncle        late 40s-early 49s   Lung cancer Paternal Uncle        smoker   Melanoma Maternal Grandmother        dx in her late 29s-early 73s    SOCIAL HISTORY:  Social History   Socioeconomic History   Marital status: Married    Spouse name: Chrisanna Mishra   Number of children: 4   Years of education: RN   Highest education level: Not on file  Occupational History   Occupation: Charity fundraiser E.D Investment banker, operational    Employer: [employer]    Employer: health system management  Tobacco Use   Smoking status: Former    Current packs/day: 0.00    Average packs/day: 1 pack/day for 20.0 years (20.0 ttl pk-yrs)    Types: Cigarettes, E-cigarettes    Start date: 08/08/1991    Quit date: 08/08/2011    Years since quitting: 11.9   Smokeless tobacco: Never   Tobacco comments:    quit jan 2012  Substance and Sexual Activity   Alcohol use: Yes    Comment: occasionally    Drug use: No   Sexual activity: Not on file  Other Topics Concern   Not on file  Social History Narrative   Not on file   Social Drivers of Health   Financial Resource Strain: Not on file  Food Insecurity: No Food Insecurity (06/30/2023)   Hunger Vital Sign    Worried About Running Out of Food in the Last Year: Never true    Ran Out of Food in the Last Year: Never true  Transportation Needs: No Transportation Needs (06/30/2023)   PRAPARE - Administrator, Civil Service (Medical): No    Lack of Transportation (Non-Medical): No  Physical Activity: Not on file  Stress: Not on file  Social Connections: Not on file    ALLERGIES:  Allergies  Allergen Reactions   Sulfa Antibiotics Rash    MEDICATIONS:  Current Outpatient Medications  Medication Sig Dispense Refill   albuterol (PROVENTIL HFA;VENTOLIN HFA) 108 (90 Base) MCG/ACT inhaler Inhale into the lungs every 6 (six) hours as needed for wheezing or shortness of breath.     cetirizine (ZYRTEC) 10 MG tablet Take 10 mg by mouth daily.     cyclobenzaprine (FLEXERIL) 10 MG tablet Take 10 mg by mouth at bedtime as needed.     docusate sodium  (COLACE) 100 MG capsule Take 1 capsule (100 mg total) by mouth every 12 (twelve) hours. 60 capsule 0   esomeprazole (NEXIUM) 40 MG capsule Take 40 mg by mouth daily at 12 noon.      fluticasone-salmeterol (ADVAIR  DISKUS) 250-50 MCG/ACT AEPB Inhale 1 puff into the lungs in the morning and at bedtime. 180 each 6   gabapentin (NEURONTIN) 300 MG capsule Take 600 mg by mouth 3 (three) times daily.     ibuprofen  (ADVIL ,MOTRIN ) 600 MG tablet TAKE 1 TABLET BY MOUTH FOUR TIMES A DAY FOR 5 DAYS THEN EVERY 6 HOURS IF NEEDED  0   meloxicam  (MOBIC ) 7.5 MG tablet Take 7.5 mg by mouth daily.     montelukast  (SINGULAIR ) 10 MG tablet Take 1 tablet (10 mg total) by mouth  at bedtime. 30 tablet 11   Multiple Vitamins-Minerals (MULTIVITAMIN WITH MINERALS) tablet Take 1 tablet by mouth daily.     norethindrone-ethinyl estradiol-FE (LOESTRIN FE) 1-20 MG-MCG tablet Take 1 tablet by mouth daily.     No current facility-administered medications for this encounter.    REVIEW OF SYSTEMS: A 10+ POINT REVIEW OF SYSTEMS WAS OBTAINED including neurology, dermatology, psychiatry, cardiac, respiratory, lymph, extremities, GI, GU, musculoskeletal, constitutional, reproductive, HEENT. On the provided form, she reports night sweats, loss of sleep, fatigue, (does not effect her ADL's), lower back pain (throbbing and aching), wearing glasses, runny nose, sinus problems, nose bleeding, hoarse voice, SOB with ambulation and walking upstairs, heartburn, a left breast lump with pain, back pain, anxiety, and hot flashes. She denies any other symptoms.    PHYSICAL EXAM:     06/30/2023  Vitals with BMI   Height   Weight 205 lbs   BMI   Systolic 130   Diastolic 78   Pulse 84    Lungs are clear to auscultation bilaterally. Heart has regular rate and rhythm. No palpable cervical, supraclavicular, or axillary adenopathy. Abdomen soft, non-tender, normal bowel sounds. Breast: Right breast with tenderness to palpation in the lower aspect of the breast but no palpable mass, nipple discharge, or bleeding. Left breast with tenderness to palpation in the outer breast associated with the biopsy lesion.   KPS = 100  100 -  Normal; no complaints; no evidence of disease. 90   - Able to carry on normal activity; minor signs or symptoms of disease. 80   - Normal activity with effort; some signs or symptoms of disease. 66   - Cares for self; unable to carry on normal activity or to do active work. 60   - Requires occasional assistance, but is able to care for most of his personal needs. 50   - Requires considerable assistance and frequent medical care. 40   - Disabled; requires special care and assistance. 30   - Severely disabled; hospital admission is indicated although death not imminent. 20   - Very sick; hospital admission necessary; active supportive treatment necessary. 10   - Moribund; fatal processes progressing rapidly. 0     - Dead  Karnofsky DA, Abelmann WH, Craver LS and Burchenal [facility] [phone]) The use of the nitrogen mustards in the palliative treatment of carcinoma: with particular reference to bronchogenic carcinoma Cancer 1 634-56  LABORATORY DATA:  Lab Results  Component Value Date   WBC 6.8 06/30/2023   HGB 12.5 06/30/2023   HCT 38.7 06/30/2023   MCV 85.2 06/30/2023   PLT 280 06/30/2023   Lab Results  Component Value Date   NA 137 06/30/2023   K 3.9 06/30/2023   CL 104 06/30/2023   CO2 27 06/30/2023   Lab Results  Component Value Date   ALT 14 06/30/2023   AST 15 06/30/2023   ALKPHOS 50 06/30/2023   BILITOT 0.3 06/30/2023    PULMONARY FUNCTION TEST:   Review Flowsheet       Latest Ref Rng & Units 09/10/2021  Spirometry  FVC-%Pred-Pre % 83   FVC-%Pred-Post % 86   FEV1-Pre L 2.88   FEV1-%Pred-Pre % 92   FEV1-Post L 2.99   FEV1-%Pred-Post % 95   DLCO unc ml/min/mmHg 19.73     RADIOGRAPHY: US  LT BREAST BX W LOC DEV 1ST LESION IMG BX SPEC US  GUIDE Addendum Date: 06/24/2023 ADDENDUM REPORT: 06/24/2023 09:25 ADDENDUM: Pathology revealed GRADE II INVASIVE DUCTAL CARCINOMA, DUCTAL CARCINOMA IN SITU,  INTERMEDIATE GRADE of the LEFT breast, 2 o'clock, (coil clip). This was found to be  concordant by Dr. Inocente Ast. Pathology results were discussed with the patient by telephone. The patient reported doing well after the biopsy with tenderness at the site. Post biopsy instructions and care were reviewed and questions were answered. The patient was encouraged to call [facility] for any additional concerns. My direct phone number was provided. The patient was referred to [facility] on June 30, 2023. Pathology results reported by Hendricks Benders, RN on 06/23/2023. Electronically Signed   By: Inocente Ast M.D.   On: 06/24/2023 09:25   Result Date: 06/24/2023 CLINICAL DATA:  53 year old female presenting for biopsy of a mass in the left breast. EXAM: ULTRASOUND GUIDED LEFT BREAST CORE NEEDLE BIOPSY COMPARISON:  Previous exam(s). PROCEDURE: I met with the patient and we discussed the procedure of ultrasound-guided biopsy, including benefits and alternatives. We discussed the high likelihood of a successful procedure. We discussed the risks of the procedure, including infection, bleeding, tissue injury, clip migration, and inadequate sampling. Informed written consent was given. The usual time-out protocol was performed immediately prior to the procedure. Lesion quadrant: Upper outer quadrant Using sterile technique and 1% Lidocaine as local anesthetic, under direct ultrasound visualization, a 14 gauge spring-loaded device was used to perform biopsy of a mass in the left breast at 2 o'clock using a lateral approach. At the conclusion of the procedure a coil shaped tissue marker clip was deployed into the biopsy cavity. Follow up 2 view mammogram was performed and dictated separately. IMPRESSION: Ultrasound guided biopsy of a mass in the left breast at 2 o'clock. No apparent complications. Electronically Signed: By: Inocente Ast M.D. On: 06/22/2023 15:22   MM CLIP PLACEMENT LEFT Result  Date: 06/22/2023 CLINICAL DATA:  Post procedure mammogram for clip placement EXAM: 3D DIAGNOSTIC LEFT MAMMOGRAM POST ULTRASOUND BIOPSY COMPARISON:  Previous exam(s). ACR Breast Density Category c: The breasts are heterogeneously dense, which may obscure small masses. FINDINGS: 3D Mammographic images were obtained following ultrasound guided biopsy of a mass in the left breast at 2 o'clock. The coil biopsy marking clip is in expected position at the site of biopsy. IMPRESSION: Appropriate positioning of the coil biopsy marking clip at the site of biopsy in the upper outer left breast. Final Assessment: Post Procedure Mammograms for Marker Placement Electronically Signed   By: Inocente Ast M.D.   On: 06/22/2023 15:19   MM 3D DIAGNOSTIC MAMMOGRAM UNILATERAL LEFT BREAST Result Date: 06/22/2023 CLINICAL DATA:  53 year old female presents for further evaluation of possible LEFT breast asymmetry on screening mammogram. EXAM: DIGITAL DIAGNOSTIC UNILATERAL LEFT MAMMOGRAM WITH TOMOSYNTHESIS AND CAD; ULTRASOUND LEFT BREAST LIMITED TECHNIQUE: Left digital diagnostic mammography and breast tomosynthesis was performed. The images were evaluated with computer-aided detection. ; Targeted ultrasound examination of the left breast was performed. COMPARISON:  Previous exam(s). ACR Breast Density Category c: The breasts are heterogeneously dense, which may obscure small masses. FINDINGS: Full field and spot compression views of the LEFT breast demonstrate a persistent asymmetry/possible subtle distortion within the middle to posterior depth UPPER-OUTER LEFT breast. Targeted ultrasound is performed, showing a 1.1 x 0.6 x 0.9 cm irregular hypoechoic mass at the 2 o'clock position of the LEFT breast 7 cm from the nipple, likely representing mammographic finding. No abnormal LEFT axillary lymph nodes are noted. IMPRESSION: Suspicious 1.1 cm UPPER-OUTER LEFT breast mass. Tissue sampling is recommended. No  abnormal appearing LEFT  axillary lymph nodes. RECOMMENDATION: Ultrasound-guided LEFT breast biopsy, which will be scheduled. Recommend sonographic/mammographic correlation on post biopsy clip films. I have discussed the findings and recommendations with the patient. If applicable, a reminder letter will be sent to the patient regarding the next appointment. BI-RADS CATEGORY  4: Suspicious. Electronically Signed   By: Reyes Phi M.D.   On: 06/22/2023 09:58   US  LIMITED ULTRASOUND INCLUDING AXILLA LEFT BREAST  Result Date: 06/22/2023 CLINICAL DATA:  53 year old female presents for further evaluation of possible LEFT breast asymmetry on screening mammogram. EXAM: DIGITAL DIAGNOSTIC UNILATERAL LEFT MAMMOGRAM WITH TOMOSYNTHESIS AND CAD; ULTRASOUND LEFT BREAST LIMITED TECHNIQUE: Left digital diagnostic mammography and breast tomosynthesis was performed. The images were evaluated with computer-aided detection. ; Targeted ultrasound examination of the left breast was performed. COMPARISON:  Previous exam(s). ACR Breast Density Category c: The breasts are heterogeneously dense, which may obscure small masses. FINDINGS: Full field and spot compression views of the LEFT breast demonstrate a persistent asymmetry/possible subtle distortion within the middle to posterior depth UPPER-OUTER LEFT breast. Targeted ultrasound is performed, showing a 1.1 x 0.6 x 0.9 cm irregular hypoechoic mass at the 2 o'clock position of the LEFT breast 7 cm from the nipple, likely representing mammographic finding. No abnormal LEFT axillary lymph nodes are noted. IMPRESSION: Suspicious 1.1 cm UPPER-OUTER LEFT breast mass. Tissue sampling is recommended. No abnormal appearing LEFT axillary lymph nodes. RECOMMENDATION: Ultrasound-guided LEFT breast biopsy, which will be scheduled. Recommend sonographic/mammographic correlation on post biopsy clip films. I have discussed the findings and recommendations with the patient. If applicable, a reminder letter will be sent to  the patient regarding the next appointment. BI-RADS CATEGORY  4: Suspicious. Electronically Signed   By: Reyes Phi M.D.   On: 06/22/2023 09:58   MM 3D SCREENING MAMMOGRAM BILATERAL BREAST Result Date: 06/14/2023 CLINICAL DATA:  Screening. EXAM: DIGITAL SCREENING BILATERAL MAMMOGRAM WITH TOMOSYNTHESIS AND CAD TECHNIQUE: Bilateral screening digital craniocaudal and mediolateral oblique mammograms were obtained. Bilateral screening digital breast tomosynthesis was performed. The images were evaluated with computer-aided detection. COMPARISON:  Previous exam(s). ACR Breast Density Category c: The breasts are heterogeneously dense, which may obscure small masses. FINDINGS: In the left breast, a possible asymmetry warrants further evaluation. In the right breast, no findings suspicious for malignancy. IMPRESSION: Further evaluation is suggested for possible asymmetry in the left breast. RECOMMENDATION: Diagnostic mammogram and possibly ultrasound of the left breast. (Code:[id number]) The patient will be contacted regarding the findings, and additional imaging will be scheduled. BI-RADS CATEGORY  0: Incomplete: Need additional imaging evaluation. Electronically Signed   By: Dina  Arceo M.D.   On: 06/14/2023 14:10      IMPRESSION: Stage IA (cT1c, cN0, cM0) Left Breast UOQ, Invasive and in situ ductal carcinoma, ER+ / PR+ / Her2-, Grade 2  Patient will be a good candidate for breast conservation with radiotherapy to the left breast. We discussed the general course of radiation, potential side effects, and toxicities with radiation and the patient is interested in this approach. We will see the patient approximately 3-4 weeks after her surgery to review final pathology and for treatment planning.   PLAN:  Left breast lumpectomy w/ SLN excisions  Oncotype Dx Adjuvant radiation therapy  Aromatase inhibitor    ------------------------------------------------   Leeroy Due, PA-C   Randeep Biondolillo D. Brittanni Cariker, PhD, MD    [facility]  Radiation Oncology Direct Dial: [phone]  Fax: [phone] [email or web address]   This document serves as a record of services personally performed  by Lynwood Nasuti, MD and Leeroy Due, PA-C. It was created on his behalf by Dorthy Fuse, a trained medical scribe. The creation of this record is based on the scribe's personal observations and the provider's statements to them. This document has been checked and approved by the attending provider.

## 2023-06-30 NOTE — Assessment & Plan Note (Signed)
 06/22/2023:Screening mammogram detected left breast asymmetry/mass measuring 1.1 cm, axilla negative, ultrasound biopsy: Grade 2 IDC ER 99%, PR 100%, Ki67 2%, HER2 negative  Pathology and radiology counseling:Discussed with the patient, the details of pathology including the type of breast cancer,the clinical staging, the significance of ER, PR and HER-2/neu receptors and the implications for treatment. After reviewing the pathology in detail, we proceeded to discuss the different treatment options between surgery, radiation, chemotherapy, antiestrogen therapies.  Recommendations: 1. Breast conserving surgery followed by 2. Oncotype DX testing to determine if chemotherapy would be of any benefit followed by 3. Adjuvant radiation therapy followed by 4. Adjuvant antiestrogen therapy  Oncotype counseling: I discussed Oncotype DX test. I explained to the patient that this is a 21 gene panel to evaluate patient tumors DNA to calculate recurrence score. This would help determine whether patient has high risk or low risk breast cancer. She understands that if her tumor was found to be high risk, she would benefit from systemic chemotherapy. If low risk, no need of chemotherapy.  Return to clinic after surgery to discuss final pathology report and then determine if Oncotype DX testing will need to be sent.

## 2023-06-30 NOTE — Progress Notes (Signed)
 [facility] CONSULT NOTE  Patient Care Team: Dwight Trula SQUIBB, MD as PCP - General (Internal Medicine) Baxter Drivers, MD as Referring Physician (Emergency Medicine) Tyree Nanetta SAILOR, RN as Oncology Nurse Navigator Ebbie Cough, MD as Consulting Physician (General Surgery) Odean Potts, MD as Consulting Physician (Hematology and Oncology) Shannon Agent, MD as Consulting Physician (Radiation Oncology)  CHIEF COMPLAINTS/PURPOSE OF CONSULTATION:  Newly diagnosed breast cancer  HISTORY OF PRESENTING ILLNESS: Ms. Surprenant is a 53 year old premenopausal woman who had a screening mammogram detected left breast asymmetry by ultrasound measured 1.1 cm, axilla was negative.  Ultrasound-guided biopsy revealed grade 2 invasive ductal carcinoma ER 99%, PR 100%, Ki67 2%, HER2 negative.  She was presented this morning to the multidisciplinary tumor board.  Genetics was done in 2016 which was negative.  Her mother died of breast cancer at age 73.  She is here today accompanied by her sister and her husband.  I reviewed her records extensively and collaborated the history with the patient.  SUMMARY OF ONCOLOGIC HISTORY: Oncology History  Malignant neoplasm of upper-outer quadrant of left breast in female, estrogen receptor positive (HCC)  06/22/2023 Initial Diagnosis   Screening mammogram detected left breast asymmetry/mass measuring 1.1 cm, axilla negative, ultrasound biopsy: Grade 2 IDC ER 99%, PR 100%, Ki67 2%, HER2 negative      MEDICAL HISTORY:  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 animals   Epilepsy (HCC)    as child; has been on phenobarbital and tegretal in the past, at around 53 yo, had repeat EEG which was normal, and was tapered off seizure medications   Erosive gastritis 03/15/2012   Family history of breast cancer    Family history of melanoma    Family history of stomach cancer    Pulse irregularity     Seasonal allergies     SURGICAL HISTORY: Past Surgical History:  Procedure Laterality Date   BREAST BIOPSY     BREAST BIOPSY Left 06/22/2023   US  LT BREAST BX W LOC DEV 1ST LESION IMG BX SPEC US  GUIDE 06/22/2023 [facility] MAMMOGRAPHY   DENTAL SURGERY  05/23/2017   DILATION AND CURETTAGE OF UTERUS  2008   ESOPHAGOGASTRODUODENOSCOPY N/A 03/15/2012   Procedure: ESOPHAGOGASTRODUODENOSCOPY (EGD);  Surgeon: Lupita FORBES Commander, MD;  Location[facility];  Service: Endoscopy;  Laterality: N/A;   HAND SURGERY  2001   repair of tendons, nerves after glass laceerated her hand.    HERNIA REPAIR  age 6   right inguinal.    TUBAL LIGATION  2000    SOCIAL HISTORY: Social History   Socioeconomic History   Marital status: Married    Spouse name: Minaal Struckman   Number of children: 4   Years of education: RN   Highest education level: Not on file  Occupational History   Occupation: Charity fundraiser [employer]    Employer: [employer]    Employer: health system management  Tobacco Use   Smoking status: Former    Current packs/day: 0.00    Average packs/day: 1 pack/day for 20.0 years (20.0 ttl pk-yrs)    Types: Cigarettes, E-cigarettes    Start date: 08/08/1991    Quit date: 08/08/2011    Years since quitting: 11.9   Smokeless tobacco: Never   Tobacco comments:    quit jan 2012  Substance and Sexual Activity   Alcohol use: Yes    Comment: occasionally    Drug  use: No   Sexual activity: Not on file  Other Topics Concern   Not on file  Social History Narrative   Not on file   Social Drivers of Health   Financial Resource Strain: Not on file  Food Insecurity: Not on file  Transportation Needs: Not on file  Physical Activity: Not on file  Stress: Not on file  Social Connections: Not on file  Intimate Partner Violence: Not on file    FAMILY HISTORY: Family History  Problem Relation Age of Onset   Breast cancer Mother 43       passed away in 07-14-2003   Hypertension Father    Hyperlipidemia  Father    Asthma Father        heavy smoker   GER disease Father    Stomach cancer Maternal Uncle        late 40s-early 88s   Lung cancer Paternal Uncle        smoker   Melanoma Maternal Grandmother        dx in her late 30s-early 87s    ALLERGIES:  is allergic to sulfa antibiotics.  MEDICATIONS:  Current Outpatient Medications  Medication Sig Dispense Refill   albuterol (PROVENTIL HFA;VENTOLIN HFA) 108 (90 Base) MCG/ACT inhaler Inhale into the lungs every 6 (six) hours as needed for wheezing or shortness of breath.     cetirizine (ZYRTEC) 10 MG tablet Take 10 mg by mouth daily.     cyclobenzaprine (FLEXERIL) 10 MG tablet Take 10 mg by mouth at bedtime as needed.     docusate sodium  (COLACE) 100 MG capsule Take 1 capsule (100 mg total) by mouth every 12 (twelve) hours. 60 capsule 0   esomeprazole (NEXIUM) 40 MG capsule Take 40 mg by mouth daily at 12 noon.     fluticasone-salmeterol (ADVAIR  DISKUS) 250-50 MCG/ACT AEPB Inhale 1 puff into the lungs in the morning and at bedtime. 180 each 6   gabapentin (NEURONTIN) 300 MG capsule Take 600 mg by mouth 3 (three) times daily.     ibuprofen  (ADVIL ,MOTRIN ) 600 MG tablet TAKE 1 TABLET BY MOUTH FOUR TIMES A DAY FOR 5 DAYS THEN EVERY 6 HOURS IF NEEDED  0   meloxicam  (MOBIC ) 7.5 MG tablet Take 7.5 mg by mouth daily.     montelukast  (SINGULAIR ) 10 MG tablet Take 1 tablet (10 mg total) by mouth at bedtime. 30 tablet 11   Multiple Vitamins-Minerals (MULTIVITAMIN WITH MINERALS) tablet Take 1 tablet by mouth daily.     norethindrone-ethinyl estradiol-FE (LOESTRIN FE) 1-20 MG-MCG tablet Take 1 tablet by mouth daily.     No current facility-administered medications for this visit.    REVIEW OF SYSTEMS:   Constitutional: Denies fevers, chills or abnormal night sweats Breast:  Denies any palpable lumps or discharge All other systems were reviewed with the patient and are negative.  PHYSICAL EXAMINATION: ECOG PERFORMANCE STATUS: 1 - Symptomatic but  completely ambulatory  Vitals:   06/30/23 0859  BP: 130/78  Pulse: 84  Resp: 18  Temp: 98 F (36.7 C)  SpO2: 100%   Filed Weights   06/30/23 0859  Weight: 205 lb (93 kg)    GENERAL:alert, no distress and comfortable   LABORATORY DATA:  I have reviewed the data as listed Lab Results  Component Value Date   WBC 5.7 03/27/2019   HGB 12.0 03/27/2019   HCT 38.2 03/27/2019   MCV 82.9 03/27/2019   PLT 284 03/27/2019   Lab Results  Component Value Date  NA 138 03/27/2019   K 3.8 03/27/2019   CL 106 03/27/2019   CO2 23 03/27/2019    RADIOGRAPHIC STUDIES: I have personally reviewed the radiological reports and agreed with the findings in the report.  ASSESSMENT AND PLAN:  Malignant neoplasm of upper-outer quadrant of left breast in female, estrogen receptor positive (HCC) 06/22/2023:Screening mammogram detected left breast asymmetry/mass measuring 1.1 cm, axilla negative, ultrasound biopsy: Grade 2 IDC ER 99%, PR 100%, Ki67 2%, HER2 negative  Pathology and radiology counseling:Discussed with the patient, the details of pathology including the type of breast cancer,the clinical staging, the significance of ER, PR and HER-2/neu receptors and the implications for treatment. After reviewing the pathology in detail, we proceeded to discuss the different treatment options between surgery, radiation, chemotherapy, antiestrogen therapies.  Recommendations: 1. Breast conserving surgery followed by 2. Oncotype DX testing to determine if chemotherapy would be of any benefit followed by 3. Adjuvant radiation therapy followed by 4. Adjuvant antiestrogen therapy  Oncotype counseling: I discussed Oncotype DX test. I explained to the patient that this is a 21 gene panel to evaluate patient tumors DNA to calculate recurrence score. This would help determine whether patient has high risk or low risk breast cancer. She understands that if her tumor was found to be high risk, she would benefit  from systemic chemotherapy. If low risk, no need of chemotherapy.  Return to clinic after surgery to discuss final pathology report and then determine if Oncotype DX testing will need to be sent.    All questions were answered. The patient knows to call the clinic with any problems, questions or concerns.    Viinay K Nekoda Chock, MD 06/30/23

## 2023-06-30 NOTE — Progress Notes (Signed)
 [facility] Clinical Social Work  Initial Assessment   Jenny Hudson is a 53 y.o. year old female accompanied by sister and husband. Clinical Social Work was referred by [facility] for assessment of psychosocial needs.   SDOH (Social Determinants of Health) assessments performed: Yes SDOH Interventions    Flowsheet Row Clinical Support from 06/30/2023 in [facility]  SDOH Interventions   Food Insecurity Interventions Intervention Not Indicated  Housing Interventions Intervention Not Indicated  Transportation Interventions Intervention Not Indicated  Utilities Interventions Intervention Not Indicated    SDOH Screenings   Food Insecurity: No Food Insecurity (06/30/2023)  Housing: Low Risk  (06/30/2023)  Transportation Needs: No Transportation Needs (06/30/2023)  Utilities: Not At Risk (06/30/2023)  Depression (PHQ2-9): Low Risk  (06/30/2023)  Tobacco Use: Medium Risk (06/30/2023)   Received from [facility]     Distress Screen completed: No     No data to display            Family/Social Information:  Housing Arrangement: patient lives with her husband, Jama, and two of their combined 5 kids. Two 17yo at home, 69, 7, and 48 yo live outside the home Family members/support persons in your life? 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 Has good support from work, ability to take time off and to work ffrom home.  Income source: Employment Financial concerns: No Type of concern: None Food access concerns: no Advanced directives: No- discussed today Services Currently in place:  BCBS  Coping/ Adjustment to diagnosis: Patient understands treatment plan and what happens next? yes, understands the plan after meeting with the medical team Patient reported stressors: Adjusting to my illness Patient enjoys  reality tv, the beach, coloring, music, socializing/ time with family and friends Current coping skills/ strengths: Ability for insight , Active sense of humor , Communication skills , Motivation for treatment/growth , Special hobby/interest , and Supportive family/friends     SUMMARY: Current SDOH Barriers:  No major barriers identified today  Clinical Social Work Clinical Goal(s):  No clinical social work goals at this time  Interventions: Discussed common feeling and emotions when being diagnosed with cancer, and the importance of support during treatment Informed patient of the support team roles and support services at [facility] Provided CSW contact information and encouraged patient to call with any questions or concerns   Follow Up Plan: Patient will contact CSW with any support or resource needs Patient verbalizes understanding of plan: Yes    Deshawna Mcneece E Shiara Mcgough, LCSW Clinical Social Worker [facility]

## 2023-06-30 NOTE — Research (Signed)
 [employer] 2021-05 - Specimen Collection Study to Evaluate Biomarkers in Subjects with Cancer    This Coordinator has reviewed this patient's inclusion and exclusion criteria as a second review and confirms Kieren Ricci is eligible for study participation. Patient may continue with enrollment.   Laury Quale, MPH  Clinical Research Coordinator

## 2023-07-01 ENCOUNTER — Ambulatory Visit: Attending: General Surgery | Admitting: Physical Therapy

## 2023-07-01 ENCOUNTER — Encounter: Payer: Self-pay | Admitting: Physical Therapy

## 2023-07-01 ENCOUNTER — Other Ambulatory Visit: Payer: Self-pay

## 2023-07-01 DIAGNOSIS — C50412 Malignant neoplasm of upper-outer quadrant of left female breast: Secondary | ICD-10-CM | POA: Diagnosis not present

## 2023-07-01 DIAGNOSIS — J3089 Other allergic rhinitis: Secondary | ICD-10-CM | POA: Diagnosis not present

## 2023-07-01 DIAGNOSIS — Z170 Estrogen receptor positive status [ER+]: Secondary | ICD-10-CM | POA: Insufficient documentation

## 2023-07-01 DIAGNOSIS — R293 Abnormal posture: Secondary | ICD-10-CM | POA: Diagnosis not present

## 2023-07-01 DIAGNOSIS — J3081 Allergic rhinitis due to animal (cat) (dog) hair and dander: Secondary | ICD-10-CM | POA: Diagnosis not present

## 2023-07-01 DIAGNOSIS — J301 Allergic rhinitis due to pollen: Secondary | ICD-10-CM | POA: Diagnosis not present

## 2023-07-01 NOTE — Therapy (Signed)
 OUTPATIENT PHYSICAL THERAPY BREAST CANCER BASELINE EVALUATION   Patient Name: Jenny Hudson MRN: [id number] DOB:1970-12-21, 52 y.o., female Today's Date: 07/01/2023  END OF SESSION:  PT End of Session - 07/01/23 1500     Visit Number 1    Number of Visits 2    Date for PT Re-Evaluation 10/01/23    PT Start Time 1458    PT Stop Time 1530    PT Time Calculation (min) 32 min    Activity Tolerance Patient tolerated treatment well    Behavior During Therapy [facility] for tasks assessed/performed          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 animals   Epilepsy (HCC)    as child; has been on phenobarbital and tegretal in the past, at around 53 yo, had repeat EEG which was normal, and was tapered off seizure medications   Erosive gastritis 03/15/2012   Family history of breast cancer    Family history of melanoma    Family history of stomach cancer    Pulse irregularity    Seasonal allergies    Past Surgical History:  Procedure Laterality Date   BREAST BIOPSY     BREAST BIOPSY Left 06/22/2023   US  LT BREAST BX W LOC DEV 1ST LESION IMG BX SPEC US  GUIDE 06/22/2023 [facility] MAMMOGRAPHY   DENTAL SURGERY  05/23/2017   DILATION AND CURETTAGE OF UTERUS  2008   ESOPHAGOGASTRODUODENOSCOPY N/A 03/15/2012   Procedure: ESOPHAGOGASTRODUODENOSCOPY (EGD);  Surgeon: Lupita FORBES Commander, MD;  Location: [facility] ENDOSCOPY;  Service: Endoscopy;  Laterality: N/A;   HAND SURGERY  2001   repair of tendons, nerves after glass laceerated her hand.    HERNIA REPAIR  age 86   right inguinal.    TUBAL LIGATION  2000   Patient Active Problem List   Diagnosis Date Noted   Malignant neoplasm of upper-outer quadrant of left breast in female, estrogen receptor positive (HCC) 06/29/2023   Genetic testing 08/14/2014   Family history of breast cancer    Family history of stomach cancer    Family history of melanoma    Asymptomatic bacteriuria 11/16/2012    Intractable vomiting 11/16/2012   Erosive gastritis 03/15/2012   Abdominal pain, acute, epigastric 03/14/2012   Nausea and vomiting 03/14/2012   Dehydration 03/14/2012   BV (bacterial vaginosis) 03/14/2012   GERD (gastroesophageal reflux disease) 03/14/2012   PVC (premature ventricular contraction) 07/23/2010    PCP: Trula Brim, MD  REFERRING PROVIDER: Donnice Bury, MD  REFERRING DIAG: C50.412,Z17.0 (ICD-10-CM) - Malignant neoplasm of upper-outer quadrant of left breast in female, estrogen receptor positive (HCC)   THERAPY DIAG:  Abnormal posture  Malignant neoplasm of upper-outer quadrant of left breast in female, estrogen receptor positive (HCC)  Rationale for Evaluation and Treatment: Rehabilitation  ONSET DATE: 06/22/23  SUBJECTIVE:  SUBJECTIVE STATEMENT: Patient reports she is here today to be seen by her medical team for her newly diagnosed left breast cancer.   PERTINENT HISTORY:  Patient was diagnosed on 06/22/23 with left grade 2. It measures 7 mm and is located in the upper-outer quadrant. It is ER,PR+, HER2- with a Ki67 of 2%. Plan is to undergo a lumpectomy and SLNB  PATIENT GOALS:   reduce lymphedema risk and learn post op HEP.   PAIN:  Are you having pain? Yes: NPRS scale: 5 Pain location: low back on L and R Pain description: dull aching can get worse and radiate down legs Aggravating factors: heavy chores, lifting, cleaning Relieving factors: rest  PRECAUTIONS: Active CA   RED FLAGS: None   HAND DOMINANCE: right  WEIGHT BEARING RESTRICTIONS: No  FALLS:  Has patient fallen in last 6 months? 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  COGNITION: Overall cognitive status: Within functional limits for tasks assessed    POSTURE:  Forward head and rounded shoulders posture  UPPER EXTREMITY AROM/PROM:  A/PROM RIGHT   eval   Shoulder extension 69  Shoulder flexion 166  Shoulder abduction 175  Shoulder internal rotation 52  Shoulder external rotation 82    (Blank rows = not tested)  A/PROM LEFT   eval  Shoulder extension 62  Shoulder flexion 158  Shoulder abduction 177  Shoulder internal rotation 68  Shoulder external rotation 87    (Blank rows = not tested)  CERVICAL AROM: All within normal limits:    Percent limited  Flexion WFL  Extension WFL  Right lateral flexion WFL  Left lateral flexion WFL  Right rotation WFL  Left rotation WFL    UPPER EXTREMITY STRENGTH: 5/5  LYMPHEDEMA ASSESSMENTS (in cm):   LANDMARK RIGHT   eval  10 cm proximal to olecranon process 33  Olecranon process 27  10 cm proximal to ulnar styloid process 22  Just proximal to ulnar styloid process 17.1  Across hand at thumb web space 18.8  At base of 2nd digit 5.7  (Blank rows = not tested)  LANDMARK LEFT   eval  10 cm proximal to olecranon process 34.4  Olecranon process 28  10 cm proximal to ulnar styloid process 21.8  Just proximal to ulnar styloid process 15.7  Across hand at thumb web space 17.7  At base of 2nd digit 5.5  (Blank rows = not tested)  L-DEX LYMPHEDEMA SCREENING:  The patient was assessed using the L-Dex machine today to produce a lymphedema index baseline score. The patient will be reassessed on a regular basis (typically every 3 months) to obtain new L-Dex scores. If the score is > 6.5 points away from his/her baseline score indicating onset of subclinical lymphedema, it will  be recommended to wear a compression garment for 4 weeks, 12 hours per day and then be reassessed. If the score continues to be > 6.5 points from baseline at reassessment, we will initiate lymphedema treatment. Assessing in this manner has a 95% rate of preventing clinically significant lymphedema.   L-DEX FLOWSHEETS - 07/01/23 1500       L-DEX LYMPHEDEMA SCREENING   Measurement Type Unilateral    L-DEX  MEASUREMENT EXTREMITY Upper Extremity    POSITION  Standing    DOMINANT SIDE Right    At Risk Side Left    BASELINE SCORE (UNILATERAL) -1.1          QUICK DASH SURVEY:  Junie Palin - 07/01/23 0001     Open a tight or new jar Moderate difficulty    Do heavy household chores (wash walls, wash floors) Moderate difficulty    Carry a shopping bag or briefcase No difficulty    Wash your back Mild difficulty    Use a knife to cut food No difficulty    Recreational activities in which you take some force or impact through your arm, shoulder, or hand (golf, hammering, tennis) No difficulty    During the past week, to what extent has your arm, shoulder or hand problem interfered with your normal social activities with family, friends, neighbors, or groups? Not at all    During the past week, to what extent has your arm, shoulder or hand problem limited your work or other regular daily activities Not at all    Arm, shoulder, or hand pain. None    Tingling (pins and needles) in your arm, shoulder, or hand Mild    Difficulty Sleeping No difficulty    DASH Score 13.64 %           PATIENT EDUCATION:  Education details: Time spent educating patient on aspects of self-care to maximize post op recovery. Patient was educated on where and how to get a post op compression bra to use to reduce post op edema. Patient was also educated on the use of SOZO screenings and surveillance principles for early identification of lymphedema onset. She was instructed to use the post op pillow in the axilla for  pressure and pain relief. Patient educated on lymphedema risk reduction and post op shoulder/posture HEP. Person educated: Patient Education method: Explanation, Demonstration, Handout Education comprehension: Patient verbalized understanding and returned demonstration  HOME EXERCISE PROGRAM: Patient was instructed today in a home exercise program today for post op shoulder range of motion. These included active assist shoulder flexion in sitting, scapular retraction, wall walking with shoulder abduction, and hands behind head external rotation.  She was encouraged to do these twice a day, holding 3 seconds and repeating 5 times when permitted by her physician.   ASSESSMENT:  CLINICAL IMPRESSION: Pt reports to PT with recentle diagnosed L breast cancer. Her shoulder ROM is WFL at baseline. She is planning to undergo a lumpectomy and SLNB but her surgery date has not been set yet. She will benefit from a post op PT reassessment to determine needs and from L-Dex screens every 3 months for 2 years to detect subclinical lymphedema.  Pt will benefit from skilled therapeutic intervention to improve on the following deficits: Decreased knowledge of precautions, impaired UE functional use, pain, decreased ROM, postural dysfunction.   PT treatment/interventions: ADL/self-care home management, pt/family education, therapeutic exercise  REHAB POTENTIAL: Good  CLINICAL DECISION MAKING: Stable/uncomplicated  EVALUATION COMPLEXITY: Low   GOALS: Goals reviewed with patient? YES  LONG TERM GOALS: (STG=LTG)    Name Target Date Goal status  1 Pt will be able to verbalize understanding of pertinent lymphedema risk reduction practices relevant to her dx specifically related to skin care.  Baseline:  No knowledge 07/01/2023 Achieved at eval  2 Pt will be able to return demo and/or verbalize understanding of the post op HEP related to regaining shoulder ROM. Baseline:  No knowledge 07/01/2023  Achieved at  eval  3 Pt will be able to verbalize understanding of the importance of viewing the post op After Breast CA Class video for further lymphedema risk reduction education and therapeutic exercise.  Baseline:  No knowledge 07/01/2023 Achieved at eval  4 Pt will demo she has regained full shoulder ROM and function post operatively compared to baselines.  Baseline: See objective measurements taken today. 10/01/23 NEW    PLAN:  PT FREQUENCY/DURATION: EVAL and 1 follow up appointment.   PLAN FOR NEXT SESSION: will reassess 3-4 weeks post op to determine needs.   Patient will follow up at outpatient cancer rehab 3-4 weeks following surgery.  If the patient requires physical therapy at that time, a specific plan will be dictated and sent to the referring physician for approval. The patient was educated today on appropriate basic range of motion exercises to begin post operatively and the importance of viewing the After Breast Cancer class video following surgery.  Patient was educated today on lymphedema risk reduction practices as it pertains to recommendations that will benefit the patient immediately following surgery.  She verbalized good understanding.    Physical Therapy Information for After Breast Cancer Surgery/Treatment:  Lymphedema is a swelling condition that you may be at risk for in your arm if you have lymph nodes removed from the armpit area.  After a sentinel node biopsy, the risk is approximately 5-9% and is higher after an axillary node dissection.  There is treatment available for this condition and it is not life-threatening.  Contact your physician or physical therapist with concerns. You may begin the 4 shoulder/posture exercises (see additional sheet) when permitted by your physician (typically a week after surgery).  If you have drains, you may need to wait until those are removed before beginning range of motion exercises.  A general recommendation is to not lift your arms above  shoulder height until drains are removed.  These exercises should be done to your tolerance and gently.  This is not a no pain/no gain type of recovery so listen to your body and stretch into the range of motion that you can tolerate, stopping if you have pain.  If you are having immediate reconstruction, ask your plastic surgeon about doing exercises as he or she may want you to wait. We encourage you to view the After Breast Cancer class video following surgery.  You will learn information related to lymphedema risk, prevention and treatment and additional exercises to regain mobility following surgery.   While undergoing any medical procedure or treatment, try to avoid blood pressure being taken or needle sticks from occurring on the arm on the side of cancer.   This recommendation begins after surgery and continues for the rest of your life.  This may help reduce your risk of getting lymphedema (swelling in your arm). An excellent resource for those seeking information on lymphedema is the National Lymphedema Network's web site. It can be accessed at www.lymphnet.org If you notice swelling in your hand, arm or breast at any time following surgery (even if it is many years from now), please contact your doctor or physical therapist to discuss this.  Lymphedema can be treated at any time but it is easier for you if it is treated early on.  If you feel like your shoulder motion is not returning to normal in a reasonable amount of time, please contact your surgeon or physical therapist.  [facility] [phone]. [address], [address], [place] [address]  [facility] CLASS After Breast Cancer Class  After Breast Cancer Class is a specially designed exercise class video to assist you in a safe recover after having breast cancer surgery.  In this video you will learn how to get back to full function whether your drains were just removed or if you had surgery a month ago. The  video can be viewed on this page: [email or web address]://www.boyd-meyer.org/ or on YouTube here: https://youtu.[email or web address].  Class Goals  Understand specific stretches to improve the flexibility of you chest and shoulder. Learn ways to safely strengthen your upper body and improve your posture. Understand the warning signs of infection and why you may be at risk for an arm infection. Learn about Lymphedema and prevention.  ** You do not need to view this video until after surgery.  Drains should be removed to participate in the recommended exercises on the video.  Patient was instructed today in a home exercise program today for post op shoulder range of motion. These included active assist shoulder flexion in sitting, scapular retraction, wall walking with shoulder abduction, and hands behind head external rotation.  She was encouraged to do these twice a day, holding 3 seconds and repeating 5 times when permitted by her physician.    Florina Sever [place], PT 07/01/2023, 3:32 PM

## 2023-07-03 ENCOUNTER — Ambulatory Visit
Admission: RE | Admit: 2023-07-03 | Discharge: 2023-07-03 | Disposition: A | Discharge status: Home / Self Care | Source: Ambulatory Visit | Attending: Hematology and Oncology | Admitting: Hematology and Oncology

## 2023-07-03 DIAGNOSIS — C50412 Malignant neoplasm of upper-outer quadrant of left female breast: Secondary | ICD-10-CM | POA: Diagnosis not present

## 2023-07-03 DIAGNOSIS — N644 Mastodynia: Secondary | ICD-10-CM

## 2023-07-03 MED ORDER — GADOPICLENOL 0.5 MMOL/ML IV SOLN
9.0000 mL | Freq: Once | INTRAVENOUS | Status: AC | PRN
  Administered 2023-07-03: 10 mL via INTRAVENOUS

## 2023-07-05 ENCOUNTER — Telehealth: Payer: Self-pay

## 2023-07-05 DIAGNOSIS — C50912 Malignant neoplasm of unspecified site of left female breast: Secondary | ICD-10-CM | POA: Diagnosis not present

## 2023-07-05 NOTE — Telephone Encounter (Signed)
 Pt called to advise that she sent her husband's FMLA paperwork over to us  Friday 07/02/23. FMLA paperwork received at 1600 07/02/23. Called pt and LVM today to let her know we did receive it and was given to [facility] nurses; pt provided FMLA nurses number via VM.

## 2023-07-06 ENCOUNTER — Telehealth: Payer: Self-pay | Admitting: *Deleted

## 2023-07-06 ENCOUNTER — Other Ambulatory Visit: Payer: Self-pay | Admitting: General Surgery

## 2023-07-06 ENCOUNTER — Encounter: Payer: Self-pay | Admitting: *Deleted

## 2023-07-06 DIAGNOSIS — Z170 Estrogen receptor positive status [ER+]: Secondary | ICD-10-CM

## 2023-07-06 NOTE — Telephone Encounter (Signed)
 Spoke with patient to follow up from [facility] 6/25 and assess navigation needs. Patient denies any questions or concerns at this time. Encouraged her to call should anything arise.  Informed her the MRI results only show her known dz. Msg sent to team

## 2023-07-07 ENCOUNTER — Other Ambulatory Visit: Payer: Self-pay | Admitting: General Surgery

## 2023-07-07 DIAGNOSIS — C50412 Malignant neoplasm of upper-outer quadrant of left female breast: Secondary | ICD-10-CM

## 2023-07-12 ENCOUNTER — Encounter (HOSPITAL_BASED_OUTPATIENT_CLINIC_OR_DEPARTMENT_OTHER): Payer: Self-pay | Admitting: General Surgery

## 2023-07-12 ENCOUNTER — Other Ambulatory Visit: Payer: Self-pay

## 2023-07-12 DIAGNOSIS — J301 Allergic rhinitis due to pollen: Secondary | ICD-10-CM | POA: Diagnosis not present

## 2023-07-12 DIAGNOSIS — J3081 Allergic rhinitis due to animal (cat) (dog) hair and dander: Secondary | ICD-10-CM | POA: Diagnosis not present

## 2023-07-12 DIAGNOSIS — J3089 Other allergic rhinitis: Secondary | ICD-10-CM | POA: Diagnosis not present

## 2023-07-13 ENCOUNTER — Telehealth: Payer: Self-pay

## 2023-07-13 NOTE — Telephone Encounter (Signed)
 Notified Patient of completion of FMLA forms for Spouse. Fax transmission confirmation received. Copy of forms emailed to patient as requested. No other needs or concerns noted at this time.

## 2023-07-14 ENCOUNTER — Encounter: Payer: Self-pay | Admitting: *Deleted

## 2023-07-14 ENCOUNTER — Other Ambulatory Visit

## 2023-07-16 ENCOUNTER — Ambulatory Visit
Admission: RE | Admit: 2023-07-16 | Discharge: 2023-07-16 | Disposition: A | Discharge status: Home / Self Care | Source: Ambulatory Visit | Attending: General Surgery | Admitting: General Surgery

## 2023-07-16 ENCOUNTER — Other Ambulatory Visit: Payer: Self-pay | Admitting: General Surgery

## 2023-07-16 DIAGNOSIS — Z170 Estrogen receptor positive status [ER+]: Secondary | ICD-10-CM

## 2023-07-16 DIAGNOSIS — D0512 Intraductal carcinoma in situ of left breast: Secondary | ICD-10-CM | POA: Diagnosis not present

## 2023-07-16 HISTORY — PX: BREAST BIOPSY: SHX20

## 2023-07-16 MED ORDER — ENSURE PRE-SURGERY PO LIQD: 296.0000 mL | Freq: Once | ORAL | Status: DC

## 2023-07-16 MED ORDER — CHLORHEXIDINE GLUCONATE CLOTH 2 % EX PADS: 6.0000 | MEDICATED_PAD | Freq: Once | CUTANEOUS | Status: DC

## 2023-07-16 NOTE — Progress Notes (Signed)

## 2023-07-19 NOTE — Anesthesia Preprocedure Evaluation (Signed)
 Anesthesia Evaluation  Patient identified by MRN, date of birth, ID band Patient awake    Reviewed: Allergy & Precautions, NPO status , Patient's Chart, lab work & pertinent test results  Airway Mallampati: II  TM Distance: >3 FB Neck ROM: Full    Dental no notable dental hx. (+) Teeth Intact, Dental Advisory Given   Pulmonary asthma , Patient abstained from smoking., former smoker   Pulmonary exam normal breath sounds clear to auscultation       Cardiovascular (-) hypertension(-) angina (-) Past MI Normal cardiovascular exam Rhythm:Regular Rate:Normal     Neuro/Psych neg Seizures  negative psych ROS   GI/Hepatic PUD,GERD  Medicated,,  Endo/Other    Renal/GU      Musculoskeletal negative musculoskeletal ROS (+)    Abdominal   Peds  Hematology Lab Results      Component                Value               Date                      WBC                      6.8                 06/30/2023                HGB                      12.5                06/30/2023                HCT                      38.7                06/30/2023                MCV                      85.2                06/30/2023                PLT                      280                 06/30/2023              Anesthesia Other Findings All Sulfa  L Breast CA  Reproductive/Obstetrics negative OB ROS                              Anesthesia Physical Anesthesia Plan  ASA: 2  Anesthesia Plan: General   Post-op Pain Management: Regional block*, Precedex  and Tylenol  PO (pre-op)*   Induction:   PONV Risk Score and Plan: TIVA, Propofol  infusion, Midazolam , Treatment may vary due to age or medical condition, Ondansetron  and Dexamethasone   Airway Management Planned: LMA  Additional Equipment: None  Intra-op Plan:   Post-operative Plan: Extubation in OR  Informed Consent: I have reviewed the patients History and  Physical, chart, labs and discussed the procedure including the  risks, benefits and alternatives for the proposed anesthesia with the patient or authorized representative who has indicated his/her understanding and acceptance.     Dental advisory given  Plan Discussed with: CRNA and Surgeon  Anesthesia Plan Comments: (LMA TIVA w L PEC Block)         Anesthesia Quick Evaluation

## 2023-07-20 ENCOUNTER — Ambulatory Visit (HOSPITAL_BASED_OUTPATIENT_CLINIC_OR_DEPARTMENT_OTHER)
Admission: RE | Admit: 2023-07-20 | Discharge: 2023-07-20 | Disposition: A | Discharge status: Home / Self Care | Attending: General Surgery | Admitting: General Surgery

## 2023-07-20 ENCOUNTER — Other Ambulatory Visit: Payer: Self-pay

## 2023-07-20 ENCOUNTER — Encounter (HOSPITAL_BASED_OUTPATIENT_CLINIC_OR_DEPARTMENT_OTHER): Payer: Self-pay | Admitting: Anesthesiology

## 2023-07-20 ENCOUNTER — Ambulatory Visit (HOSPITAL_BASED_OUTPATIENT_CLINIC_OR_DEPARTMENT_OTHER): Payer: Self-pay | Admitting: Anesthesiology

## 2023-07-20 ENCOUNTER — Ambulatory Visit
Admission: RE | Admit: 2023-07-20 | Discharge: 2023-07-20 | Disposition: A | Discharge status: Home / Self Care | Source: Ambulatory Visit | Attending: General Surgery | Admitting: General Surgery

## 2023-07-20 ENCOUNTER — Encounter (HOSPITAL_BASED_OUTPATIENT_CLINIC_OR_DEPARTMENT_OTHER)
Admission: RE | Disposition: A | Discharge status: Home / Self Care | Payer: Self-pay | Source: Home / Self Care | Attending: General Surgery

## 2023-07-20 ENCOUNTER — Encounter (HOSPITAL_BASED_OUTPATIENT_CLINIC_OR_DEPARTMENT_OTHER): Payer: Self-pay | Admitting: General Surgery

## 2023-07-20 DIAGNOSIS — Z1721 Progesterone receptor positive status: Secondary | ICD-10-CM | POA: Insufficient documentation

## 2023-07-20 DIAGNOSIS — N62 Hypertrophy of breast: Secondary | ICD-10-CM | POA: Diagnosis not present

## 2023-07-20 DIAGNOSIS — Z170 Estrogen receptor positive status [ER+]: Secondary | ICD-10-CM | POA: Insufficient documentation

## 2023-07-20 DIAGNOSIS — Z87891 Personal history of nicotine dependence: Secondary | ICD-10-CM | POA: Insufficient documentation

## 2023-07-20 DIAGNOSIS — K219 Gastro-esophageal reflux disease without esophagitis: Secondary | ICD-10-CM | POA: Diagnosis not present

## 2023-07-20 DIAGNOSIS — Z1732 Human epidermal growth factor receptor 2 negative status: Secondary | ICD-10-CM | POA: Diagnosis not present

## 2023-07-20 DIAGNOSIS — Z8711 Personal history of peptic ulcer disease: Secondary | ICD-10-CM | POA: Insufficient documentation

## 2023-07-20 DIAGNOSIS — Z79899 Other long term (current) drug therapy: Secondary | ICD-10-CM | POA: Insufficient documentation

## 2023-07-20 DIAGNOSIS — G8918 Other acute postprocedural pain: Secondary | ICD-10-CM | POA: Diagnosis not present

## 2023-07-20 DIAGNOSIS — N6022 Fibroadenosis of left breast: Secondary | ICD-10-CM | POA: Diagnosis not present

## 2023-07-20 DIAGNOSIS — N6489 Other specified disorders of breast: Secondary | ICD-10-CM | POA: Insufficient documentation

## 2023-07-20 DIAGNOSIS — J45909 Unspecified asthma, uncomplicated: Secondary | ICD-10-CM | POA: Diagnosis not present

## 2023-07-20 DIAGNOSIS — C50412 Malignant neoplasm of upper-outer quadrant of left female breast: Secondary | ICD-10-CM | POA: Diagnosis not present

## 2023-07-20 DIAGNOSIS — Z01818 Encounter for other preprocedural examination: Secondary | ICD-10-CM

## 2023-07-20 DIAGNOSIS — Z803 Family history of malignant neoplasm of breast: Secondary | ICD-10-CM | POA: Diagnosis not present

## 2023-07-20 DIAGNOSIS — N644 Mastodynia: Secondary | ICD-10-CM | POA: Insufficient documentation

## 2023-07-20 DIAGNOSIS — C50912 Malignant neoplasm of unspecified site of left female breast: Secondary | ICD-10-CM | POA: Diagnosis not present

## 2023-07-20 DIAGNOSIS — N6012 Diffuse cystic mastopathy of left breast: Secondary | ICD-10-CM | POA: Diagnosis not present

## 2023-07-20 HISTORY — PX: AXILLARY SENTINEL NODE BIOPSY: SHX5738

## 2023-07-20 HISTORY — PX: BREAST LUMPECTOMY WITH RADIOACTIVE SEED LOCALIZATION: SHX6424

## 2023-07-20 LAB — POCT PREGNANCY, URINE: Preg Test, Ur: NEGATIVE

## 2023-07-20 SURGERY — BREAST LUMPECTOMY WITH RADIOACTIVE SEED LOCALIZATION
Anesthesia: General | Site: Breast | Laterality: Left

## 2023-07-20 MED ORDER — ACETAMINOPHEN 500 MG PO TABS
1000.0000 mg | ORAL_TABLET | ORAL | Status: AC
  Administered 2023-07-20: 1000 mg via ORAL

## 2023-07-20 MED ORDER — FENTANYL CITRATE (PF) 100 MCG/2ML IJ SOLN
INTRAMUSCULAR | Status: AC
  Filled 2023-07-20: qty 2

## 2023-07-20 MED ORDER — EPHEDRINE SULFATE (PRESSORS) 50 MG/ML IJ SOLN
INTRAMUSCULAR | Status: DC | PRN
Start: 2023-07-20 — End: 2023-07-20
  Administered 2023-07-20: 5 mg via INTRAVENOUS

## 2023-07-20 MED ORDER — EPHEDRINE 5 MG/ML INJ
INTRAVENOUS | Status: AC
  Filled 2023-07-20: qty 5

## 2023-07-20 MED ORDER — MIDAZOLAM HCL 2 MG/2ML IJ SOLN
INTRAMUSCULAR | Status: AC
  Filled 2023-07-20: qty 2

## 2023-07-20 MED ORDER — PROPOFOL 10 MG/ML IV BOLUS
INTRAVENOUS | Status: DC | PRN
Start: 2023-07-20 — End: 2023-07-20
  Administered 2023-07-20: 30 mg via INTRAVENOUS
  Administered 2023-07-20: 120 mg via INTRAVENOUS

## 2023-07-20 MED ORDER — PHENYLEPHRINE 80 MCG/ML (10ML) SYRINGE FOR IV PUSH (FOR BLOOD PRESSURE SUPPORT)
PREFILLED_SYRINGE | INTRAVENOUS | Status: AC
Start: 2023-07-20 — End: 2023-07-20
  Filled 2023-07-20: qty 10

## 2023-07-20 MED ORDER — FENTANYL CITRATE (PF) 100 MCG/2ML IJ SOLN
INTRAMUSCULAR | Status: DC | PRN
  Administered 2023-07-20: 25 ug via INTRAVENOUS
  Administered 2023-07-20: 50 ug via INTRAVENOUS

## 2023-07-20 MED ORDER — HYDROMORPHONE HCL 1 MG/ML IJ SOLN
INTRAMUSCULAR | Status: AC
  Filled 2023-07-20: qty 0.5

## 2023-07-20 MED ORDER — DEXMEDETOMIDINE HCL IN NACL 80 MCG/20ML IV SOLN
INTRAVENOUS | Status: DC | PRN
Start: 2023-07-20 — End: 2023-07-20
  Administered 2023-07-20: 8 ug via INTRAVENOUS

## 2023-07-20 MED ORDER — LIDOCAINE HCL (CARDIAC) PF 100 MG/5ML IV SOSY
PREFILLED_SYRINGE | INTRAVENOUS | Status: DC | PRN
  Administered 2023-07-20: 100 mg via INTRAVENOUS

## 2023-07-20 MED ORDER — ACETAMINOPHEN 500 MG PO TABS
ORAL_TABLET | ORAL | Status: AC
  Filled 2023-07-20: qty 2

## 2023-07-20 MED ORDER — ONDANSETRON HCL 4 MG/2ML IJ SOLN
INTRAMUSCULAR | Status: AC
  Filled 2023-07-20: qty 2

## 2023-07-20 MED ORDER — LIDOCAINE 2% (20 MG/ML) 5 ML SYRINGE
INTRAMUSCULAR | Status: AC
  Filled 2023-07-20: qty 5

## 2023-07-20 MED ORDER — KETOROLAC TROMETHAMINE 30 MG/ML IJ SOLN
INTRAMUSCULAR | Status: AC
  Filled 2023-07-20: qty 1

## 2023-07-20 MED ORDER — LACTATED RINGERS IV SOLN: INTRAVENOUS | Status: DC

## 2023-07-20 MED ORDER — ONDANSETRON HCL 4 MG/2ML IJ SOLN
INTRAMUSCULAR | Status: DC | PRN
Start: 2023-07-20 — End: 2023-07-20
  Administered 2023-07-20: 4 mg via INTRAVENOUS

## 2023-07-20 MED ORDER — FENTANYL CITRATE (PF) 100 MCG/2ML IJ SOLN
100.0000 ug | Freq: Once | INTRAMUSCULAR | Status: AC
  Administered 2023-07-20: 100 ug via INTRAVENOUS

## 2023-07-20 MED ORDER — HYDROMORPHONE HCL 1 MG/ML IJ SOLN
0.2500 mg | INTRAMUSCULAR | Status: DC | PRN
  Administered 2023-07-20 (×2): 0.25 mg via INTRAVENOUS
  Administered 2023-07-20: 0.5 mg via INTRAVENOUS

## 2023-07-20 MED ORDER — CLONIDINE HCL (ANALGESIA) 100 MCG/ML EP SOLN
EPIDURAL | Status: DC | PRN
  Administered 2023-07-20: 100 ug

## 2023-07-20 MED ORDER — TRAMADOL HCL 50 MG PO TABS: 50.0000 mg | ORAL_TABLET | Freq: Four times a day (QID) | ORAL | 0 refills | Status: DC | PRN

## 2023-07-20 MED ORDER — OXYCODONE HCL 5 MG PO TABS
5.0000 mg | ORAL_TABLET | Freq: Once | ORAL | Status: AC | PRN
  Administered 2023-07-20: 5 mg via ORAL

## 2023-07-20 MED ORDER — OXYCODONE HCL 5 MG PO TABS
ORAL_TABLET | ORAL | Status: AC
  Filled 2023-07-20: qty 1

## 2023-07-20 MED ORDER — HYDROMORPHONE HCL 1 MG/ML IJ SOLN
INTRAMUSCULAR | Status: AC
Start: 2023-07-20 — End: 2023-07-20
  Filled 2023-07-20: qty 0.5

## 2023-07-20 MED ORDER — ONDANSETRON HCL 4 MG/2ML IJ SOLN: 4.0000 mg | Freq: Once | INTRAMUSCULAR | Status: DC | PRN

## 2023-07-20 MED ORDER — PROPOFOL 500 MG/50ML IV EMUL
INTRAVENOUS | Status: DC | PRN
  Administered 2023-07-20: 150 ug/kg/min via INTRAVENOUS

## 2023-07-20 MED ORDER — PROPOFOL 500 MG/50ML IV EMUL
INTRAVENOUS | Status: AC
  Filled 2023-07-20: qty 50

## 2023-07-20 MED ORDER — MIDAZOLAM HCL 2 MG/2ML IJ SOLN
2.0000 mg | Freq: Once | INTRAMUSCULAR | Status: AC
Start: 2023-07-20 — End: 2023-07-20
  Administered 2023-07-20: 2 mg via INTRAVENOUS

## 2023-07-20 MED ORDER — DEXAMETHASONE SODIUM PHOSPHATE 10 MG/ML IJ SOLN
INTRAMUSCULAR | Status: AC
  Filled 2023-07-20: qty 1

## 2023-07-20 MED ORDER — MAGTRACE LYMPHATIC TRACER
INTRAMUSCULAR | Status: DC | PRN
Start: 2023-07-20 — End: 2023-07-20
  Administered 2023-07-20: 2 mL via INTRAMUSCULAR

## 2023-07-20 MED ORDER — KETOROLAC TROMETHAMINE 30 MG/ML IJ SOLN
30.0000 mg | Freq: Once | INTRAMUSCULAR | Status: AC | PRN
  Administered 2023-07-20: 30 mg via INTRAVENOUS

## 2023-07-20 MED ORDER — CEFAZOLIN SODIUM-DEXTROSE 2-4 GM/100ML-% IV SOLN
2.0000 g | INTRAVENOUS | Status: AC
  Administered 2023-07-20: 2 g via INTRAVENOUS

## 2023-07-20 MED ORDER — PHENYLEPHRINE HCL (PRESSORS) 10 MG/ML IV SOLN
INTRAVENOUS | Status: DC | PRN
  Administered 2023-07-20 (×3): 80 ug via INTRAVENOUS

## 2023-07-20 MED ORDER — BUPIVACAINE HCL (PF) 0.25 % IJ SOLN
INTRAMUSCULAR | Status: DC | PRN
  Administered 2023-07-20: 15 mL

## 2023-07-20 MED ORDER — DEXAMETHASONE SODIUM PHOSPHATE 10 MG/ML IJ SOLN
INTRAMUSCULAR | Status: DC | PRN
  Administered 2023-07-20: 10 mg via INTRAVENOUS

## 2023-07-20 MED ORDER — OXYCODONE HCL 5 MG/5ML PO SOLN: 5.0000 mg | Freq: Once | ORAL | Status: AC | PRN

## 2023-07-20 MED ORDER — ROPIVACAINE HCL 5 MG/ML IJ SOLN
INTRAMUSCULAR | Status: DC | PRN
  Administered 2023-07-20: 30 mL via PERINEURAL

## 2023-07-20 MED ORDER — CEFAZOLIN SODIUM-DEXTROSE 2-4 GM/100ML-% IV SOLN
INTRAVENOUS | Status: AC
  Filled 2023-07-20: qty 100

## 2023-07-20 SURGICAL SUPPLY — 47 items
BINDER BREAST LRG (GAUZE/BANDAGES/DRESSINGS) IMPLANT
BINDER BREAST MEDIUM (GAUZE/BANDAGES/DRESSINGS) IMPLANT
BINDER BREAST XLRG (GAUZE/BANDAGES/DRESSINGS) IMPLANT
BINDER BREAST XXLRG (GAUZE/BANDAGES/DRESSINGS) IMPLANT
BLADE SURG 15 STRL LF DISP TIS (BLADE) ×2 IMPLANT
CANISTER SUC SOCK COL 7IN (MISCELLANEOUS) IMPLANT
CANISTER SUCT 1200ML W/VALVE (MISCELLANEOUS) IMPLANT
CHLORAPREP W/TINT 26 (MISCELLANEOUS) ×2 IMPLANT
CLIP APPLIE 9.375 MED OPEN (MISCELLANEOUS) IMPLANT
CLIP TI WIDE RED SMALL 6 (CLIP) IMPLANT
COVER BACK TABLE 60X90IN (DRAPES) ×2 IMPLANT
COVER MAYO STAND STRL (DRAPES) ×2 IMPLANT
COVER PROBE CYLINDRICAL 5X96 (MISCELLANEOUS) ×2 IMPLANT
DERMABOND ADVANCED .7 DNX12 (GAUZE/BANDAGES/DRESSINGS) ×2 IMPLANT
DRAPE LAPAROSCOPIC ABDOMINAL (DRAPES) ×2 IMPLANT
DRAPE UTILITY XL STRL (DRAPES) ×2 IMPLANT
DRSG TEGADERM 4X4.75 (GAUZE/BANDAGES/DRESSINGS) IMPLANT
ELECT COATED BLADE 2.86 ST (ELECTRODE) ×2 IMPLANT
ELECTRODE REM PT RTRN 9FT ADLT (ELECTROSURGICAL) ×2 IMPLANT
GAUZE SPONGE 4X4 12PLY STRL LF (GAUZE/BANDAGES/DRESSINGS) IMPLANT
GLOVE BIO SURGEON STRL SZ7 (GLOVE) ×4 IMPLANT
GLOVE BIOGEL PI IND STRL 7.5 (GLOVE) ×2 IMPLANT
GOWN STRL REUS W/ TWL LRG LVL3 (GOWN DISPOSABLE) ×4 IMPLANT
HEMOSTAT ARISTA ABSORB 3G PWDR (HEMOSTASIS) IMPLANT
KIT MARKER MARGIN INK (KITS) ×2 IMPLANT
NDL HYPO 25X1 1.5 SAFETY (NEEDLE) ×2 IMPLANT
NEEDLE HYPO 25X1 1.5 SAFETY (NEEDLE) ×2 IMPLANT
NS IRRIG 1000ML POUR BTL (IV SOLUTION) IMPLANT
PACK BASIN DAY SURGERY FS (CUSTOM PROCEDURE TRAY) ×2 IMPLANT
PENCIL SMOKE EVACUATOR (MISCELLANEOUS) ×2 IMPLANT
POWDER SURGICEL 3.0 GRAM (HEMOSTASIS) IMPLANT
RETRACTOR ONETRAX LX 90X20 (MISCELLANEOUS) IMPLANT
SLEEVE SCD COMPRESS KNEE MED (STOCKING) ×2 IMPLANT
SPIKE FLUID TRANSFER (MISCELLANEOUS) IMPLANT
SPONGE T-LAP 4X18 ~~LOC~~+RFID (SPONGE) ×2 IMPLANT
STRIP CLOSURE SKIN 1/2X4 (GAUZE/BANDAGES/DRESSINGS) ×2 IMPLANT
SUT MNCRL AB 4-0 PS2 18 (SUTURE) ×2 IMPLANT
SUT MON AB 5-0 PS2 18 (SUTURE) IMPLANT
SUT SILK 2 0 SH (SUTURE) IMPLANT
SUT VIC AB 2-0 SH 27XBRD (SUTURE) ×2 IMPLANT
SUT VIC AB 3-0 SH 27X BRD (SUTURE) ×2 IMPLANT
SUT VIC AB 5-0 PS2 18 (SUTURE) IMPLANT
SYR CONTROL 10ML LL (SYRINGE) ×2 IMPLANT
TOWEL GREEN STERILE FF (TOWEL DISPOSABLE) ×2 IMPLANT
TRAY FAXITRON CT DISP (TRAY / TRAY PROCEDURE) ×2 IMPLANT
TUBE CONNECTING 20X1/4 (TUBING) IMPLANT
YANKAUER SUCT BULB TIP NO VENT (SUCTIONS) IMPLANT

## 2023-07-20 NOTE — Discharge Instructions (Addendum)
 [facility] Office Phone Number [phone]  POST OP INSTRUCTIONS Take 400 mg of ibuprofen  every 8 hours or 650 mg tylenol  every 6 hours for next 72 hours then as needed. Use ice several times daily also.  A prescription for pain medication may be given to you upon discharge.  Take your pain medication as prescribed, if needed.  If narcotic pain medicine is not needed, then you may take acetaminophen  (Tylenol ), naprosyn  (Alleve) or ibuprofen  (Advil ) as needed. Take your usually prescribed medications unless otherwise directed If you need a refill on your pain medication, please contact your pharmacy.  They will contact our office to request authorization.  Prescriptions will not be filled after 5pm or on week-ends. You should eat very light the first 24 hours after surgery, such as soup, crackers, pudding, etc.  Resume your normal diet the day after surgery. Most patients will experience some swelling and bruising in the breast.  Ice packs and a good support bra will help.  Wear the breast binder provided or a sports bra for 72 hours day and night.  After that wear a sports bra during the day until you return to the office. Swelling and bruising can take several days to resolve.  It is common to experience some constipation if taking pain medication after surgery.  Increasing fluid intake and taking a stool softener will usually help or prevent this problem from occurring.  A mild laxative (Milk of Magnesia or Miralax ) should be taken according to package directions if there are no bowel movements after 48 hours. I used skin glue on the incision, you may shower in 24 hours.  The glue will flake off over the next 2-3 weeks as will the steristrips.   Any sutures or staples will be removed at the office during your follow-up visit. ACTIVITIES:  You may resume regular daily activities (gradually increasing) beginning the next day.  Wearing a good support bra or sports bra minimizes pain and  swelling.  You may have sexual intercourse when it is comfortable. You may drive when you no longer are taking prescription pain medication, you can comfortably wear a seatbelt, and you can safely maneuver your car and apply brakes. RETURN TO WORK:  ______________________________________________________________________________________ Rosine should see your doctor in the office for a follow-up appointment approximately two to three weeks after your surgery.  Your doctor's nurse will typically make your follow-up appointment when she calls you with your pathology report.  Expect your pathology report 3-4 business days after your surgery.  You may call to check if you do not hear from us  after three days.  WHEN TO CALL DR WAKEFIELD: Fever over 101.0 Nausea and/or vomiting. Extreme swelling or bruising. Continued bleeding from incision. Increased pain, redness, or drainage from the incision.  The clinic staff is available to answer your questions during regular business hours.  Please don't hesitate to call and ask to speak to one of the nurses for clinical concerns.  If you have a medical emergency, go to the nearest emergency room or call 911.  A surgeon from [facility] is always on call at the hospital.  For further questions, please visit [email or web address]  No Tylenol  before 4:12pm today. No Ibuprofen /NSAIDS before 9pm tonight.   Post Anesthesia Home Care Instructions  Activity: Get plenty of rest for the remainder of the day. A responsible individual must stay with you for 24 hours following the procedure.  For the next 24 hours, DO NOT: -Drive a car -  Operate machinery -Drink alcoholic beverages -Take any medication unless instructed by your physician -Make any legal decisions or sign important papers.  Meals: Start with liquid foods such as gelatin or soup. Progress to regular foods as tolerated. Avoid greasy, spicy, heavy foods. If nausea and/or vomiting  occur, drink only clear liquids until the nausea and/or vomiting subsides. Call your physician if vomiting continues.  Special Instructions/Symptoms: Your throat may feel dry or sore from the anesthesia or the breathing tube placed in your throat during surgery. If this causes discomfort, gargle with warm salt water. The discomfort should disappear within 24 hours.  If you had a scopolamine patch placed behind your ear for the management of post- operative nausea and/or vomiting:  1. The medication in the patch is effective for 72 hours, after which it should be removed.  Wrap patch in a tissue and discard in the trash. Wash hands thoroughly with soap and water. 2. You may remove the patch earlier than 72 hours if you experience unpleasant side effects which may include dry mouth, dizziness or visual disturbances. 3. Avoid touching the patch. Wash your hands with soap and water after contact with the patch.

## 2023-07-20 NOTE — Anesthesia Procedure Notes (Signed)
 Anesthesia Regional Block: Pectoralis block   Pre-Anesthetic Checklist: , timeout performed,  Correct Patient, Correct Site, Correct Laterality,  Correct Procedure, Correct Position, site marked,  Risks and benefits discussed,  Surgical consent,  Pre-op evaluation,  At surgeon's request and post-op pain management  Laterality: Left  Prep: chloraprep       Needles:  Injection technique: Single-shot  Needle Type: Echogenic Needle     Needle Length: 9cm  Needle Gauge: 21     Additional Needles:   Procedures:,,,, ultrasound used (permanent image in chart),,    Narrative:  Start time: 07/20/2023 10:45 AM End time: 07/20/2023 10:52 AM Injection made incrementally with aspirations every 5 mL.  Performed by: Personally  Anesthesiologist: Jefm Garnette LABOR, MD  Additional Notes: Block assessed. Patient tolerated procedure well.

## 2023-07-20 NOTE — Anesthesia Postprocedure Evaluation (Signed)
 Anesthesia Post Note  Patient: Jenny Hudson  Procedure(s) Performed: BREAST LUMPECTOMY WITH RADIOACTIVE SEED LOCALIZATION (Left: Breast) BIOPSY, LYMPH NODE, SENTINEL, AXILLARY (Left)     Patient location during evaluation: PACU Anesthesia Type: General and Regional Level of consciousness: awake and alert Pain management: pain level controlled Vital Signs Assessment: post-procedure vital signs reviewed and stable Respiratory status: spontaneous breathing, nonlabored ventilation, respiratory function stable and patient connected to nasal cannula oxygen Cardiovascular status: blood pressure returned to baseline and stable Postop Assessment: no apparent nausea or vomiting Anesthetic complications: no   No notable events documented.  Last Vitals:  Vitals:   07/20/23 1407 07/20/23 1415  BP:  112/70  Pulse:  71  Resp: 11 14  Temp:  (!) 36.3 C  SpO2: 93% 97%    Last Pain:  Vitals:   07/20/23 1426  TempSrc:   PainSc: 4                  Garnette DELENA Gab

## 2023-07-20 NOTE — Op Note (Signed)
 Preoperative diagnosis: Clinical stage I left breast cancer Postoperative diagnosis: Same as above Procedure: 1.  Left breast radioactive seed guided lumpectomy 2.  Injection of mag trace for sentinel lymph node identification 3.  Left deep axillary sentinel lymph node biopsy Surgeon: Dr. Adina Bury Anesthesia: General With a pectoral block Estimated blood loss: Less than 50 cc Specimens: 1.  Left breast tissue containing seed and clip marked with paint 2.  Additional posterior margin marked short superior, long lateral, double deep 3.  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 FH of mom at age 51. She has negative genetic testing in 2016. She had no mass or dc. She had screening mm that shows left breast asymmetry. Has some right breast pain. She has c density tissue. In left UOQ she has an asymmetry that on US  has a mass that is 1.1x0.9x0.6 cm. Axillary US  is negative. Biopsy is grade II IDC that is 99% er pos, 100% pr pos, her2 negative and Ki is 2%. We discussed lumpectomy and sentinel node biopsy.    Procedure: After informed consent was obtained she first underwent a pectoral block.  She was given antibiotics.  SCDs were in place.  She was placed under general anesthesia without complication.  She was prepped and draped in the standard sterile surgical fashion.  Surgical timeout was then performed.   I first injected 2 cc of mag trace in the subareolar position and massaged this for 5 minutes.   I then located the seed in the upper outer quadrant.  This was fairly close to the skin so I elected to make a curvilinear incision overlying the seed in the cancer.  I then removed the seed in the surrounding tissue with an attempt to get clear margins in  conjunction with what was seen on her 3D imaging as well.  Mammogram confirmed removal of the seed and the clip.  I did a 3D image and I found that I thought I was close to the posterior margin and I removed these as above.   I placed a couple of clips in the cavity.  I then obtained hemostasis.  I then closed the breast tissue with 2-0 Vicryl.  The skin was closed with 3-0 Vicryl and 4-0 Monocryl.  Glue Steri-Strips were eventually applied.   I then was able to identify activity in the low axilla.  I made an incision below the axillary hairline and carried this through the fascia.  I then was able to identify some low level activity in one node.  There was also one brown node.    There is no more activity present in the axilla.  I then obtained hemostasis.  This was closed with 2-0 Vicryl, 3-0 Vicryl and 4-0 Monocryl.  Glue and Steri-Strips were applied.  She tolerated this well was extubated and transferred to recovery stable.

## 2023-07-20 NOTE — H&P (Signed)
 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 FH of mom at age 52. She has negative genetic testing in 2016. She had no mass or dc. She had screening mm that shows left breast asymmetry. Has some right breast pain. She has c density tissue. In left UOQ she has an asymmetry that on US  has a mass that is 1.1x0.9x0.6 cm. Axillary US  is negative. Biopsy is grade II IDC that is 99% er pos, 100% pr pos, her2 negative and Ki is 2%. She is here with her sister and husband to discuss options  Review of Systems: A complete review of systems was obtained from the patient. I have reviewed this information and discussed as appropriate with the patient. See HPI as well for other ROS.  Review of Systems  Constitutional: Positive for malaise/fatigue.  HENT: Positive for sinus pain.  Respiratory: Positive for shortness of breath (with stairs).  Gastrointestinal: Positive for heartburn.  Psychiatric/Behavioral: The patient is nervous/anxious.  All other systems reviewed and are negative.  Medical History: Past Medical History:  Diagnosis Date  Anemia  Anxiety  Arrhythmia  Asthma, unspecified asthma severity, unspecified whether complicated, unspecified whether persistent (HHS-HCC)  Seizures (CMS/HHS-HCC)   Patient Active Problem List  Diagnosis  Malignant neoplasm of upper-outer quadrant of left breast in female, estrogen receptor positive (CMS/HHS-HCC)   Past Surgical History:  Procedure Laterality Date  tubal ligation   Allergies  Allergen Reactions  Sulfa (Sulfonamide Antibiotics) Rash and Other (See Comments)   Current Outpatient Medications on File Prior to Visit  Medication Sig Dispense Refill  ADVAIR  DISKUS 250-50 mcg/dose diskus inhaler Inhale 1 Puff into the lungs  albuterol MDI, PROVENTIL, VENTOLIN, PROAIR, HFA 90 mcg/actuation inhaler Inhale into the lungs  cetirizine (ZYRTEC) 10 MG tablet Take 10 mg by mouth once daily  cyclobenzaprine  (FLEXERIL) 10 MG tablet Take 10 mg by mouth at bedtime as needed  esomeprazole (NEXIUM) 40 MG DR capsule Take 40 mg by mouth  gabapentin (NEURONTIN) 300 MG capsule Take 600 mg by mouth 3 (three) times daily  meloxicam  (MOBIC ) 7.5 MG tablet Take 7.5 mg by mouth once daily  montelukast  (SINGULAIR ) 10 mg tablet Take 10 mg by mouth at bedtime  norethindrone-ethinyl estradiol (JUNEL FE 1/20) 1 mg-20 mcg (21)/75 mg (7) tablet Take 1 tablet by mouth once daily   Family History  Problem Relation Age of Onset  Hyperlipidemia (Elevated cholesterol) Mother  Breast cancer Mother  Obesity Father  High blood pressure (Hypertension) Father  Hyperlipidemia (Elevated cholesterol) Father  Diabetes Father  Myocardial Infarction (Heart attack) Father   Social History   Tobacco Use  Smoking Status Former  Types: Cigarettes  Smokeless Tobacco Never  Marital status: Married  Tobacco Use  Smoking status: Former  Types: Cigarettes  Smokeless tobacco: Never   Objective:   Vitals:  06/30/23 0754   Physical Exam Vitals and nursing note reviewed.  Constitutional:  Appearance: Normal appearance.  Chest:  Breasts: Right: No inverted nipple, mass or nipple discharge.  Left: No inverted nipple, mass or nipple discharge.  Lymphadenopathy:  Upper Body:  Right upper body: No supraclavicular or axillary adenopathy.  Left upper body: No supraclavicular or axillary adenopathy.  Neurological:  Mental Status: She is alert.   Assessment and Plan:   Malignant neoplasm of upper-outer quadrant of left breast in female, estrogen receptor positive (CMS/HHS-HCC)  left breast seed guided lumpectomy, sn biopsy  We discussed the staging and pathophysiology of breast cancer. We  discussed all of the different options for treatment for breast cancer including surgery, chemotherapy, radiation therapy,and antiestrogen therapy.  We discussed a sentinel lymph node biopsy as she does not appear to having lymph node  involvement right now. We discussed the performance of that with injection of Magtrace. Small risk of discoloration with that. We discussed that there is a chance of having a positive node with a sentinel lymph node biopsy and we will await the permanent pathology to make any other first further decisions in terms of her treatment. We discussed up to a 5% risk lifetime of chronic shoulder pain as well as lymphedema associated with a sentinel lymph node biopsy.  We discussed the options for treatment of the breast cancer which included lumpectomy versus a mastectomy. We discussed the performance of the lumpectomy with radioactive seed placement. We discussed a 5-10% chance of a positive margin requiring reexcision in the operating room. We also discussed that she will likely need radiation therapy if she undergoes lumpectomy. We discussed mastectomy and the postoperative care for that as well. Mastectomy can be followed by reconstruction. The decision for lumpectomy vs mastectomy has no impact on decision for chemotherapy. Most mastectomy patients will not need radiation therapy. We discussed that there is no difference in her survival whether she undergoes lumpectomy with radiation therapy or antiestrogen therapy versus a mastectomy. There is also no real difference between her recurrence in the breast.  We discussed the risks of operation including bleeding, infection, possible reoperation. She understands her further therapy will be based on what her stages at the time of her operation.  Discussed possible risk of additional biopsies with MRI. Will schedule surgery once MRI done.

## 2023-07-20 NOTE — Progress Notes (Signed)
 Assisted Dr. Richardson Landry with left, pectoralis, ultrasound guided block. Side rails up, monitors on throughout procedure. See vital signs in flow sheet. Tolerated Procedure well.

## 2023-07-20 NOTE — Anesthesia Procedure Notes (Signed)
 Procedure Name: LMA Insertion Date/Time: 07/20/2023 11:42 AM  Performed by: Frost Kayla MATSU, CRNAPre-anesthesia Checklist: Patient identified, Emergency Drugs available, Suction available and Patient being monitored Patient Re-evaluated:Patient Re-evaluated prior to induction Oxygen Delivery Method: Circle system utilized Preoxygenation: Pre-oxygenation with 100% oxygen Induction Type: IV induction LMA: LMA inserted LMA Size: 4.0 Number of attempts: 1 Placement Confirmation: positive ETCO2 Tube secured with: Tape Dental Injury: Teeth and Oropharynx as per pre-operative assessment

## 2023-07-20 NOTE — Interval H&P Note (Signed)
 History and Physical Interval Note:  07/20/2023 11:07 AM  Jenny Hudson  has presented today for surgery, with the diagnosis of LEFT BREAST CANCER.  The various methods of treatment have been discussed with the patient and family. After consideration of risks, benefits and other options for treatment, the patient has consented to  Procedure(s) with comments: BREAST LUMPECTOMY WITH RADIOACTIVE SEED LOCALIZATION (Left) BIOPSY, LYMPH NODE, SENTINEL, AXILLARY (Left) - LMA w/PEC BLOCK LEFT BREAST SEED GUIDED LUMPECTOMY LEFT AXILLARY SENTINEL NODE BIOPSY as a surgical intervention.  The patient's history has been reviewed, patient examined, no change in status, stable for surgery.  I have reviewed the patient's chart and labs.  Questions were answered to the patient's satisfaction.     Donnice Bury

## 2023-07-20 NOTE — Transfer of Care (Signed)
 Immediate Anesthesia Transfer of Care Note  Patient: Jenny Hudson  Procedure(s) Performed: BREAST LUMPECTOMY WITH RADIOACTIVE SEED LOCALIZATION (Left: Breast) BIOPSY, LYMPH NODE, SENTINEL, AXILLARY (Left)  Patient Location: PACU  Anesthesia Type:GA combined with regional for post-op pain  Level of Consciousness: drowsy, patient cooperative, and responds to stimulation  Airway & Oxygen Therapy: Patient Spontanous Breathing and Patient connected to face mask oxygen  Post-op Assessment: Report given to RN and Post -op Vital signs reviewed and stable  Post vital signs: Reviewed and stable  Last Vitals:  Vitals Value Taken Time  BP    Temp    Pulse    Resp    SpO2      Last Pain:  Vitals:   07/20/23 1008  TempSrc: Temporal  PainSc: 0-No pain      Patients Stated Pain Goal: 3 (07/20/23 1008)  Complications: No notable events documented.

## 2023-07-21 ENCOUNTER — Encounter (HOSPITAL_BASED_OUTPATIENT_CLINIC_OR_DEPARTMENT_OTHER): Payer: Self-pay | Admitting: General Surgery

## 2023-07-23 LAB — SURGICAL PATHOLOGY

## 2023-07-26 ENCOUNTER — Telehealth: Payer: Self-pay | Admitting: *Deleted

## 2023-07-26 ENCOUNTER — Encounter: Payer: Self-pay | Admitting: *Deleted

## 2023-07-26 NOTE — Telephone Encounter (Signed)
 Ordered oncotype per Dr. Pamelia Hoit. Sent requisition to pathology and [facility] sciences.

## 2023-07-28 ENCOUNTER — Telehealth: Payer: Self-pay | Admitting: Hematology and Oncology

## 2023-07-28 NOTE — Telephone Encounter (Signed)
 Spoke with patient confirming upcoming appointment

## 2023-07-30 ENCOUNTER — Other Ambulatory Visit

## 2023-08-02 DIAGNOSIS — J3089 Other allergic rhinitis: Secondary | ICD-10-CM | POA: Diagnosis not present

## 2023-08-02 DIAGNOSIS — J301 Allergic rhinitis due to pollen: Secondary | ICD-10-CM | POA: Diagnosis not present

## 2023-08-02 DIAGNOSIS — J3081 Allergic rhinitis due to animal (cat) (dog) hair and dander: Secondary | ICD-10-CM | POA: Diagnosis not present

## 2023-08-04 DIAGNOSIS — Z170 Estrogen receptor positive status [ER+]: Secondary | ICD-10-CM | POA: Diagnosis not present

## 2023-08-04 DIAGNOSIS — C50412 Malignant neoplasm of upper-outer quadrant of left female breast: Secondary | ICD-10-CM | POA: Diagnosis not present

## 2023-08-05 ENCOUNTER — Encounter: Payer: Self-pay | Admitting: *Deleted

## 2023-08-05 ENCOUNTER — Telehealth: Payer: Self-pay | Admitting: *Deleted

## 2023-08-05 ENCOUNTER — Encounter (HOSPITAL_COMMUNITY): Payer: Self-pay

## 2023-08-05 DIAGNOSIS — C50412 Malignant neoplasm of upper-outer quadrant of left female breast: Secondary | ICD-10-CM

## 2023-08-05 NOTE — Telephone Encounter (Signed)
 Received oncotype results 11/3%. Spoke with patient and she is aware. Referral placed for Dr. Shannon.

## 2023-08-10 ENCOUNTER — Encounter: Payer: Self-pay | Admitting: Physical Therapy

## 2023-08-10 ENCOUNTER — Ambulatory Visit: Attending: General Surgery | Admitting: Physical Therapy

## 2023-08-10 DIAGNOSIS — R293 Abnormal posture: Secondary | ICD-10-CM | POA: Diagnosis present

## 2023-08-10 DIAGNOSIS — J301 Allergic rhinitis due to pollen: Secondary | ICD-10-CM | POA: Diagnosis not present

## 2023-08-10 DIAGNOSIS — C50412 Malignant neoplasm of upper-outer quadrant of left female breast: Secondary | ICD-10-CM | POA: Diagnosis present

## 2023-08-10 DIAGNOSIS — Z170 Estrogen receptor positive status [ER+]: Secondary | ICD-10-CM | POA: Insufficient documentation

## 2023-08-10 DIAGNOSIS — J3081 Allergic rhinitis due to animal (cat) (dog) hair and dander: Secondary | ICD-10-CM | POA: Diagnosis not present

## 2023-08-10 DIAGNOSIS — J3089 Other allergic rhinitis: Secondary | ICD-10-CM | POA: Diagnosis not present

## 2023-08-10 NOTE — Therapy (Signed)
 OUTPATIENT PHYSICAL THERAPY BREAST CANCER POST OP FOLLOW UP   Patient Name: Jenny Hudson MRN: [id number] DOB:07-13-1970, 53 y.o., female Today's Date: 08/10/2023  END OF SESSION:  PT End of Session - 08/10/23 0838     Visit Number 2    Number of Visits 2    Date for PT Re-Evaluation 10/01/23    Authorization Type auth required after 25 visits    Authorization - Visit Number 2    Authorization - Number of Visits 25    PT Start Time 0810    PT Stop Time 0837    PT Time Calculation (min) 27 min    Activity Tolerance Patient tolerated treatment well    Behavior During Therapy [facility] for tasks assessed/performed          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 animals   Epilepsy (HCC)    as child; has been on phenobarbital and tegretal in the past, at around 53 yo, had repeat EEG which was normal, and was tapered off seizure medications   Erosive gastritis 03/15/2012   Family history of breast cancer    Family history of melanoma    Family history of stomach cancer    Pulse irregularity    Seasonal allergies    Past Surgical History:  Procedure Laterality Date   AXILLARY SENTINEL NODE BIOPSY Left 07/20/2023   Procedure: BIOPSY, LYMPH NODE, SENTINEL, AXILLARY;  Surgeon: Ebbie Cough, MD;  Location: [facility];  Service: General;  Laterality: Left;  LMA w/PEC BLOCK LEFT BREAST SEED GUIDED LUMPECTOMY LEFT AXILLARY SENTINEL NODE BIOPSY   BREAST BIOPSY     BREAST BIOPSY Left 06/22/2023   US  LT BREAST BX W LOC DEV 1ST LESION IMG BX SPEC US  GUIDE 06/22/2023 [facility]   BREAST BIOPSY Left 07/16/2023   US  LT RADIOACTIVE SEED LOC 07/16/2023 [facility]   BREAST LUMPECTOMY WITH RADIOACTIVE SEED LOCALIZATION Left 07/20/2023   Procedure: BREAST LUMPECTOMY WITH RADIOACTIVE SEED LOCALIZATION;  Surgeon: Ebbie Cough, MD;  Location: [facility];  Service: General;  Laterality:  Left;   DENTAL SURGERY  05/23/2017   DILATION AND CURETTAGE OF UTERUS  2008   ESOPHAGOGASTRODUODENOSCOPY N/A 03/15/2012   Procedure: ESOPHAGOGASTRODUODENOSCOPY (EGD);  Surgeon: Lupita FORBES Commander, MD;  Location: [facility];  Service: Endoscopy;  Laterality: N/A;   HAND SURGERY  2001   repair of tendons, nerves after glass laceerated her hand.    HERNIA REPAIR  age 6   right inguinal.    TUBAL LIGATION  2000   Patient Active Problem List   Diagnosis Date Noted   Malignant neoplasm of upper-outer quadrant of left breast in female, estrogen receptor positive (HCC) 06/29/2023   Genetic testing 08/14/2014   Family history of breast cancer    Family history of stomach cancer    Family history of melanoma    Asymptomatic bacteriuria 11/16/2012   Intractable vomiting 11/16/2012   Erosive gastritis 03/15/2012   Abdominal pain, acute, epigastric 03/14/2012   Nausea and vomiting 03/14/2012   Dehydration 03/14/2012   BV (bacterial vaginosis) 03/14/2012   GERD (gastroesophageal reflux disease) 03/14/2012   PVC (premature ventricular contraction) 07/23/2010    PCP: Trula Brim, MD  REFERRING PROVIDER: Cough Ebbie, MD  REFERRING DIAG: C50.412,Z17.0 (ICD-10-CM) - Malignant neoplasm of upper-outer quadrant of left breast in female, estrogen receptor positive (HCC)   THERAPY DIAG:  Abnormal posture  Malignant neoplasm  of upper-outer quadrant of left breast in female, estrogen receptor positive (HCC)  Rationale for Evaluation and Treatment: Rehabilitation  ONSET DATE: 06/22/23  SUBJECTIVE:                                                                                                                                                                                           SUBJECTIVE STATEMENT: I am not having any issues with ROM. I will need radiation. I have had a lot of swelling. I did have some fluid drained after my surgery. I went back the next week and no additional fluid had to  be drained.   PERTINENT HISTORY:  Patient was diagnosed on 06/22/23 with left grade 2. It measures 7 mm and is located in the upper-outer quadrant. It is ER,PR+, HER2- with a Ki67 of 2%. Underwent L breast lumpectomy and SLNB 0/4 on 07/20/23  PATIENT GOALS:  Reassess how my recovery is going related to arm function, pain, and swelling.  PAIN:  Are you having pain? Yes: NPRS scale: 4/10 Pain location: L breast and axilla Pain description: achy and tender Aggravating factors: nothing Relieving factors: tramadol  at night to help, ibuprofen /tylenol  combo  PRECAUTIONS: Recent Surgery, left UE Lymphedema risk  RED FLAGS: None   ACTIVITY LEVEL / LEISURE: doing the post op exercises, still walking depending on weather   OBJECTIVE:   PATIENT SURVEYS:  QUICK DASH:  Quick Dash - 08/10/23 0001     Open a tight or new jar Moderate difficulty    Do heavy household chores (wash walls, wash floors) Mild difficulty    Carry a shopping bag or briefcase No difficulty    Wash your back Mild difficulty    Use a knife to cut food No difficulty    Recreational activities in which you take some force or impact through your arm, shoulder, or hand (golf, hammering, tennis) No difficulty    During the past week, to what extent has your arm, shoulder or hand problem interfered with your normal social activities with family, friends, neighbors, or groups? Not at all    During the past week, to what extent has your arm, shoulder or hand problem limited your work or other regular daily activities Not at all    Arm, shoulder, or hand pain. Moderate    Tingling (pins and needles) in your arm, shoulder, or hand Mild    Difficulty Sleeping Mild difficulty    DASH Score 18.18 %           OBSERVATIONS: Healing lumpectomy and SLNB scars, seroma palpable in L axilla, cording visible in L axilla extending to upper arm  but not limiting ROM  POSTURE:  Forward head and rounded shoulders posture   UPPER  EXTREMITY AROM/PROM:   A/PROM RIGHT   eval    Shoulder extension 69  Shoulder flexion 166  Shoulder abduction 175  Shoulder internal rotation 52  Shoulder external rotation 82                          (Blank rows = not tested)   A/PROM LEFT   eval LEFT eval  Shoulder extension 62 60  Shoulder flexion 158 170  Shoulder abduction 177 175  Shoulder internal rotation 68 51  Shoulder external rotation 87 84                          (Blank rows = not tested)   CERVICAL AROM: All within normal limits:      Percent limited  Flexion WFL  Extension WFL  Right lateral flexion WFL  Left lateral flexion WFL  Right rotation WFL  Left rotation WFL      UPPER EXTREMITY STRENGTH: 5/5   LYMPHEDEMA ASSESSMENTS (in cm):    LANDMARK RIGHT   eval  10 cm proximal to olecranon process 33  Olecranon process 27  10 cm proximal to ulnar styloid process 22  Just proximal to ulnar styloid process 17.1  Across hand at thumb web space 18.8  At base of 2nd digit 5.7  (Blank rows = not tested)   LANDMARK LEFT   eval LEFT 08/10/23  10 cm proximal to olecranon process 34.4 33.8  Olecranon process 28 28.4  10 cm proximal to ulnar styloid process 21.8 21.4  Just proximal to ulnar styloid process 15.7 15.9  Across hand at thumb web space 17.7 18.4  At base of 2nd digit 5.5 5.8  (Blank rows = not tested)    Surgery type/Date: 07/20/23 - L breast lumpectomy and SLNB Number of lymph nodes removed: 0/4 Current/past treatment (chemo, radiation, hormone therapy): will require radiation and hormone therapy Other symptoms:  Heaviness/tightness Yes Pain Yes Pitting edema No Infections No Decreased scar mobility Yes - still healing Stemmer sign No  PATIENT EDUCATION:  Education details: ABC class, continue end range stretching for cording, scar massage, wear foam pad in compression bra to avoid scar irritation Person educated: Patient Education method: Explanation and Handouts Education  comprehension: verbalized understanding  HOME EXERCISE PROGRAM: Reviewed previously given post op HEP. Can wear 1/2 grey foam in TG soft in compression bra to decrease irritation  ASSESSMENT:  CLINICAL IMPRESSION: Pt returns to PT after undergoing a L lumpectomy and SLNB 0/4 on 07/20/23. She has returned to baseline shoulder ROM and does not demonstrate any signs of lymphedema in her LUE. She did have a seroma that was drained once. It is still palpable in L axilla but pt reports it has nto worsened since it was drained. Cut 1/2 grey foam and placed in TG soft for pt to wear in compression bra for additional compression. Pt has had some cording in LUE that does not limit ROM but does cause end range discomfort. Educated pt to continue end range stretching for cording. Pt to be discharged from skilled PT services at this time but was educated to return if her swelling or cording worsens and limits ROM or function.   Pt will benefit from skilled therapeutic intervention to improve on the following deficits: Decreased knowledge of precautions, impaired UE functional use, pain, decreased  ROM, postural dysfunction.   PT treatment/interventions: ADL/Self care home management, [id number]- PT Re-evaluation, 97110-Therapeutic exercises, 97530- Therapeutic activity, [id number]- Neuromuscular re-education, 97535- Self Care, and [id number]- Manual therapy   GOALS: Goals reviewed with patient? Yes  LONG TERM GOALS:  (STG=LTG)  GOALS Name Target Date  Goal status  1 Pt will demonstrate she has regained full shoulder ROM and function post operatively compared to baselines.  Baseline: 08/10/23  MET     PLAN:  PT FREQUENCY/DURATION: d/c this session  PLAN FOR NEXT SESSION: d/c this session, continue L dex screenings every 3 months for the first 2 years post op   [facility]  [address]  [place] [address]  [phone]  After Breast Cancer Class Video It is recommended  you view the ABC class video to be educated on lymphedema risk reduction. This video lasts for about 30 minutes. It can be viewed on our website here: [email or web address]  Scar massage at 6 weeks You can begin gentle scar massage to you incision sites. Gently place one hand on the incision and move the skin (without sliding on the skin) in various directions. Do this for a few minutes and then you can gently massage either coconut oil or vitamin E cream into the scars.  Compression garment You should continue wearing your compression bra until you feel like you no longer have swelling.  Home exercise Program Continue doing the exercises you were given until you feel like you can do them without feeling any tightness at the end.   Walking Program Studies show that 30 minutes of walking per day (fast enough to elevate your heart rate) can significantly reduce the risk of a cancer recurrence. If you can't walk due to other medical reasons, we encourage you to find another activity you could do (like a stationary bike or water exercise).  Posture After breast cancer surgery, people frequently sit with rounded shoulders posture because it puts their incisions on slack and feels better. If you sit like this and scar tissue forms in that position, you can become very tight and have pain sitting or standing with good posture. Try to be aware of your posture and sit and stand up tall to heal properly.  Follow up PT: It is recommended you return every 3 months for the first 3 years following surgery to be assessed on the SOZO machine for an L-Dex score. This helps prevent clinically significant lymphedema in 95% of patients. These follow up screens are 10 minute appointments that you are not billed for.  [facility], PT 08/10/2023, 8:44 AM  PHYSICAL THERAPY DISCHARGE SUMMARY  Visits from Start of Care: 2  Current functional  level related to goals / functional outcomes: All goals met   Remaining deficits: None - has some cording but not limiting ROM   Education / Equipment: HEP, ABC class, lymphedema education   Patient agrees to discharge. Patient goals were met. Patient is being discharged due to meeting the stated rehab goals.  Yonas Bunda Breedlove Blue, [place] 08/10/23 8:45 AM

## 2023-08-11 ENCOUNTER — Inpatient Hospital Stay: Admitting: Hematology and Oncology

## 2023-08-11 ENCOUNTER — Encounter: Payer: Self-pay | Admitting: *Deleted

## 2023-08-14 ENCOUNTER — Ambulatory Visit (HOSPITAL_BASED_OUTPATIENT_CLINIC_OR_DEPARTMENT_OTHER)
Admission: EM | Admit: 2023-08-14 | Discharge: 2023-08-14 | Disposition: A | Discharge status: Home / Self Care | Attending: Family Medicine | Admitting: Family Medicine

## 2023-08-14 ENCOUNTER — Emergency Department (HOSPITAL_COMMUNITY)
Admission: EM | Admit: 2023-08-14 | Discharge: 2023-08-14 | Disposition: A | Discharge status: Home / Self Care | Attending: Emergency Medicine | Admitting: Emergency Medicine

## 2023-08-14 ENCOUNTER — Encounter (HOSPITAL_BASED_OUTPATIENT_CLINIC_OR_DEPARTMENT_OTHER): Payer: Self-pay | Admitting: Emergency Medicine

## 2023-08-14 ENCOUNTER — Other Ambulatory Visit: Payer: Self-pay

## 2023-08-14 ENCOUNTER — Encounter (HOSPITAL_COMMUNITY): Payer: Self-pay | Admitting: Emergency Medicine

## 2023-08-14 DIAGNOSIS — Z853 Personal history of malignant neoplasm of breast: Secondary | ICD-10-CM | POA: Insufficient documentation

## 2023-08-14 DIAGNOSIS — Z803 Family history of malignant neoplasm of breast: Secondary | ICD-10-CM | POA: Diagnosis not present

## 2023-08-14 DIAGNOSIS — J45909 Unspecified asthma, uncomplicated: Secondary | ICD-10-CM | POA: Diagnosis not present

## 2023-08-14 DIAGNOSIS — Z7951 Long term (current) use of inhaled steroids: Secondary | ICD-10-CM | POA: Diagnosis not present

## 2023-08-14 DIAGNOSIS — K219 Gastro-esophageal reflux disease without esophagitis: Secondary | ICD-10-CM | POA: Diagnosis not present

## 2023-08-14 DIAGNOSIS — Z79899 Other long term (current) drug therapy: Secondary | ICD-10-CM | POA: Diagnosis not present

## 2023-08-14 DIAGNOSIS — D72829 Elevated white blood cell count, unspecified: Secondary | ICD-10-CM | POA: Insufficient documentation

## 2023-08-14 DIAGNOSIS — N644 Mastodynia: Secondary | ICD-10-CM

## 2023-08-14 DIAGNOSIS — Z87891 Personal history of nicotine dependence: Secondary | ICD-10-CM | POA: Diagnosis not present

## 2023-08-14 DIAGNOSIS — N611 Abscess of the breast and nipple: Secondary | ICD-10-CM

## 2023-08-14 DIAGNOSIS — Z170 Estrogen receptor positive status [ER+]: Secondary | ICD-10-CM | POA: Diagnosis not present

## 2023-08-14 DIAGNOSIS — G40909 Epilepsy, unspecified, not intractable, without status epilepticus: Secondary | ICD-10-CM | POA: Diagnosis not present

## 2023-08-14 DIAGNOSIS — E860 Dehydration: Secondary | ICD-10-CM | POA: Diagnosis not present

## 2023-08-14 DIAGNOSIS — Z825 Family history of asthma and other chronic lower respiratory diseases: Secondary | ICD-10-CM | POA: Diagnosis not present

## 2023-08-14 DIAGNOSIS — Z8249 Family history of ischemic heart disease and other diseases of the circulatory system: Secondary | ICD-10-CM | POA: Diagnosis not present

## 2023-08-14 DIAGNOSIS — Z791 Long term (current) use of non-steroidal anti-inflammatories (NSAID): Secondary | ICD-10-CM | POA: Diagnosis not present

## 2023-08-14 DIAGNOSIS — C50412 Malignant neoplasm of upper-outer quadrant of left female breast: Secondary | ICD-10-CM | POA: Diagnosis not present

## 2023-08-14 LAB — CBC WITH DIFFERENTIAL/PLATELET
Abs Immature Granulocytes: 0.11 K/uL — ABNORMAL HIGH (ref 0.00–0.07)
Basophils Absolute: 0.1 K/uL (ref 0.0–0.1)
Basophils Relative: 0 %
Eosinophils Absolute: 0.1 K/uL (ref 0.0–0.5)
Eosinophils Relative: 1 %
HCT: 37.9 % (ref 36.0–46.0)
Hemoglobin: 11.7 g/dL — ABNORMAL LOW (ref 12.0–15.0)
Immature Granulocytes: 1 %
Lymphocytes Relative: 7 %
Lymphs Abs: 1.2 K/uL (ref 0.7–4.0)
MCH: 26.3 pg (ref 26.0–34.0)
MCHC: 30.9 g/dL (ref 30.0–36.0)
MCV: 85.2 fL (ref 80.0–100.0)
Monocytes Absolute: 1.6 K/uL — ABNORMAL HIGH (ref 0.1–1.0)
Monocytes Relative: 9 %
Neutro Abs: 14.3 K/uL — ABNORMAL HIGH (ref 1.7–7.7)
Neutrophils Relative %: 82 %
Platelets: 233 K/uL (ref 150–400)
RBC: 4.45 MIL/uL (ref 3.87–5.11)
RDW: 14.5 % (ref 11.5–15.5)
WBC: 17.4 K/uL — ABNORMAL HIGH (ref 4.0–10.5)
nRBC: 0 % (ref 0.0–0.2)

## 2023-08-14 LAB — COMPREHENSIVE METABOLIC PANEL WITH GFR
ALT: 19 U/L (ref 0–44)
AST: 20 U/L (ref 15–41)
Albumin: 3.7 g/dL (ref 3.5–5.0)
Alkaline Phosphatase: 57 U/L (ref 38–126)
Anion gap: 17 — ABNORMAL HIGH (ref 5–15)
BUN: 19 mg/dL (ref 6–20)
CO2: 17 mmol/L — ABNORMAL LOW (ref 22–32)
Calcium: 8.4 mg/dL — ABNORMAL LOW (ref 8.9–10.3)
Chloride: 100 mmol/L (ref 98–111)
Creatinine, Ser: 0.8 mg/dL (ref 0.44–1.00)
GFR, Estimated: >60 mL/min (ref >60)
Glucose, Bld: 93 mg/dL (ref 70–99)
Potassium: 3.6 mmol/L (ref 3.5–5.1)
Sodium: 134 mmol/L — ABNORMAL LOW (ref 135–145)
Total Bilirubin: 1 mg/dL (ref 0.0–1.2)
Total Protein: 7.4 g/dL (ref 6.5–8.1)

## 2023-08-14 MED ORDER — HYDROMORPHONE HCL 1 MG/ML IJ SOLN
0.5000 mg | Freq: Once | INTRAMUSCULAR | Status: AC
  Administered 2023-08-14: 0.5 mg via INTRAVENOUS
  Filled 2023-08-14: qty 1

## 2023-08-14 MED ORDER — OXYCODONE HCL 5 MG PO TABS: 5.0000 mg | ORAL_TABLET | Freq: Four times a day (QID) | ORAL | 0 refills | Status: DC | PRN

## 2023-08-14 MED ORDER — SODIUM CHLORIDE 0.9 % IV SOLN
1.0000 g | Freq: Once | INTRAVENOUS | Status: AC
  Administered 2023-08-14: 1 g via INTRAVENOUS
  Filled 2023-08-14: qty 10

## 2023-08-14 MED ORDER — SODIUM CHLORIDE 0.9 % IV BOLUS
1000.0000 mL | Freq: Once | INTRAVENOUS | Status: AC
  Administered 2023-08-14: 1000 mL via INTRAVENOUS

## 2023-08-14 MED ORDER — IBUPROFEN 800 MG PO TABS
800.0000 mg | ORAL_TABLET | Freq: Once | ORAL | Status: AC
  Administered 2023-08-14: 800 mg via ORAL

## 2023-08-14 MED ORDER — DOXYCYCLINE HYCLATE 100 MG PO TABS: 100.0000 mg | ORAL_TABLET | Freq: Two times a day (BID) | ORAL | 0 refills | Status: DC

## 2023-08-14 NOTE — ED Triage Notes (Signed)
 Pt had a left breast lumpectomy on 7/15 by Dr. Ebbie pt had a follow up on 08/05 the area was looking good and everything was going good. Pt reports yesterday she started having chills, pain in her neck and she had a headache. Pt woke up this morning with 10/10 pain to left breast and she has redness and swelling to her breast. No drainage noted

## 2023-08-14 NOTE — Discharge Instructions (Addendum)
 I will be sending a prescription for antibiotics and pain medication to your pharmacy  Please call for worsening pain or fevers  Call the office on Monday morning to set up an appointment to come and see me for reevaluation.

## 2023-08-14 NOTE — ED Provider Notes (Signed)
 [facility]    CSN: [id number] Arrival date & time: 08/14/23  1037      History   Chief Complaint No chief complaint on file.   HPI Jenny Hudson is a 53 y.o. female.   53 year old female who had a left breast lumpectomy in the left upper quadrant on 07/20/2023 by Dr. Ebbie at an outpatient surgery center.  Dr. Ebbie is with [facility] surgery in [place].  She had a follow-up on 08/10/2023 and everything was doing well.  The lumpectomy was cancer but the sentinel nodes were negative.  She was doing well until the afternoon of 08/13/2023.  At approximately 3 in the afternoon on 08/13/2023, she developed fever and chills.  Her highest fever was 101.  She has had fever intermittently through the night.  This morning she woke up to severe left breast pain, redness and swelling.  She rates her left breast pain at 10 out of 10.  There is no drainage but there is a fresh suture line from the lumpectomy.  She also has a significant headache and some neck pain or bodyaches.  She spoke to the on-call service for her surgeon and was advised to go to an emergency room or urgent care.     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 animals   Epilepsy (HCC)    as child; has been on phenobarbital and tegretal in the past, at around 53 yo, had repeat EEG which was normal, and was tapered off seizure medications   Erosive gastritis 03/15/2012   Family history of breast cancer    Family history of melanoma    Family history of stomach cancer    Pulse irregularity    Seasonal allergies     Patient Active Problem List   Diagnosis Date Noted   Malignant neoplasm of upper-outer quadrant of left breast in female, estrogen receptor positive (HCC) 06/29/2023   Genetic testing 08/14/2014   Family history of breast cancer    Family history of stomach cancer    Family history of melanoma    Asymptomatic bacteriuria  11/16/2012   Intractable vomiting 11/16/2012   Erosive gastritis 03/15/2012   Abdominal pain, acute, epigastric 03/14/2012   Nausea and vomiting 03/14/2012   Dehydration 03/14/2012   BV (bacterial vaginosis) 03/14/2012   GERD (gastroesophageal reflux disease) 03/14/2012   PVC (premature ventricular contraction) 07/23/2010    Past Surgical History:  Procedure Laterality Date   AXILLARY SENTINEL NODE BIOPSY Left 07/20/2023   Procedure: BIOPSY, LYMPH NODE, SENTINEL, AXILLARY;  Surgeon: Ebbie Cough, MD;  Location: [facility];  Service: General;  Laterality: Left;  LMA w/PEC BLOCK LEFT BREAST SEED GUIDED LUMPECTOMY LEFT AXILLARY SENTINEL NODE BIOPSY   BREAST BIOPSY     BREAST BIOPSY Left 06/22/2023   US  LT BREAST BX W LOC DEV 1ST LESION IMG BX SPEC US  GUIDE 06/22/2023 [facility]   BREAST BIOPSY Left 07/16/2023   US  LT RADIOACTIVE SEED LOC 07/16/2023 [facility]   BREAST LUMPECTOMY WITH RADIOACTIVE SEED LOCALIZATION Left 07/20/2023   Procedure: BREAST LUMPECTOMY WITH RADIOACTIVE SEED LOCALIZATION;  Surgeon: Ebbie Cough, MD;  Location: [facility];  Service: General;  Laterality: Left;   DENTAL SURGERY  05/23/2017   DILATION AND CURETTAGE OF UTERUS  2008   ESOPHAGOGASTRODUODENOSCOPY N/A 03/15/2012   Procedure: ESOPHAGOGASTRODUODENOSCOPY (EGD);  Surgeon: Lupita FORBES Commander, MD;  Location: [facility];  Service:  Endoscopy;  Laterality: N/A;   HAND SURGERY  August 25, 1999   repair of tendons, nerves after glass laceerated her hand.    HERNIA REPAIR  age 89   right inguinal.    TUBAL LIGATION  2000    OB History   No obstetric history on file.      Home Medications    Prior to Admission medications   Medication Sig Start Date End Date Taking? Authorizing Provider  esomeprazole (NEXIUM) 40 MG capsule Take 40 mg by mouth daily at 12 noon.   Yes [provider]  levocetirizine (XYZAL) 2.5 MG/5ML solution Take 2.5 mg by mouth every  evening.   Yes [provider]  montelukast  (SINGULAIR ) 10 MG tablet Take 1 tablet (10 mg total) by mouth at bedtime. 06/10/21  Yes Kara Dorn NOVAK, MD  sertraline (ZOLOFT) 100 MG tablet Take 100 mg by mouth daily.   Yes [provider]  albuterol (PROVENTIL HFA;VENTOLIN HFA) 108 (90 Base) MCG/ACT inhaler Inhale into the lungs every 6 (six) hours as needed for wheezing or shortness of breath.    [provider]  cetirizine (ZYRTEC) 10 MG tablet Take 10 mg by mouth daily. 02/16/21   [provider]  cyclobenzaprine (FLEXERIL) 10 MG tablet Take 10 mg by mouth at bedtime as needed. Patient not taking: No sig reported 03/14/21   [provider]  docusate sodium  (COLACE) 100 MG capsule Take 1 capsule (100 mg total) by mouth every 12 (twelve) hours. Patient not taking: No sig reported 05/27/17   Armenta Canning, MD  fluticasone-salmeterol (ADVAIR  DISKUS) 250-50 MCG/ACT AEPB Inhale 1 puff into the lungs in the morning and at bedtime. 06/10/21   Kara Dorn NOVAK, MD  gabapentin  (NEURONTIN ) 300 MG capsule Take 600 mg by mouth 3 (three) times daily. 06/06/21   [provider]  ibuprofen  (ADVIL ,MOTRIN ) 600 MG tablet TAKE 1 TABLET BY MOUTH FOUR TIMES A DAY FOR 5 DAYS THEN EVERY 6 HOURS IF NEEDED 05/24/17   [provider]  meloxicam  (MOBIC ) 7.5 MG tablet Take 7.5 mg by mouth daily.    [provider]  Multiple Vitamins-Minerals (MULTIVITAMIN WITH MINERALS) tablet Take 1 tablet by mouth daily.    [provider]  norethindrone-ethinyl estradiol-FE (LOESTRIN FE) 1-20 MG-MCG tablet Take 1 tablet by mouth daily. Patient not taking: No sig reported 05/30/21   [provider]  traMADol  (ULTRAM ) 50 MG tablet Take 1 tablet (50 mg total) by mouth every 6 (six) hours as needed. 07/20/23   Ebbie Cough, MD    Family History Family History  Problem Relation Age of Onset   Breast cancer Mother 65       passed away in 25-Aug-2003    Hypertension Father    Hyperlipidemia Father    Asthma Father        heavy smoker   GER disease Father    Stomach cancer Maternal Uncle        late 40s-early 19s   Lung cancer Paternal Uncle        smoker   Melanoma Maternal Grandmother        dx in her late 85s-early 19s    Social History Social History   Tobacco Use   Smoking status: Former    Current packs/day: 0.00    Average packs/day: 1 pack/day for 20.0 years (20.0 ttl pk-yrs)    Types: Cigarettes, E-cigarettes    Start date: 08/08/1991    Quit date: 08/08/2011    Years since quitting: 12.0  Smokeless tobacco: Never   Tobacco comments:    quit jan 2012  Substance Use Topics   Alcohol use: Yes    Comment: occasionally    Drug use: No     Allergies   Sulfa antibiotics   Review of Systems Review of Systems  Constitutional:  Positive for chills and fever.  HENT:  Negative for ear pain and sore throat.   Eyes:  Negative for pain and visual disturbance.  Respiratory:  Negative for cough and shortness of breath.   Cardiovascular:  Positive for chest pain (Left upper quadrant breast pain with swelling, erythema and induration.). Negative for palpitations.  Gastrointestinal:  Negative for abdominal pain, constipation, diarrhea, nausea and vomiting.  Genitourinary:  Negative for dysuria and hematuria.  Musculoskeletal:  Negative for arthralgias and back pain.  Skin:  Negative for color change and rash.  Neurological:  Negative for seizures and syncope.  All other systems reviewed and are negative.    Physical Exam Triage Vital Signs ED Triage Vitals  Encounter Vitals Group     BP 08/14/23 1051 115/75     Girls Systolic BP Percentile --      Girls Diastolic BP Percentile --      Boys Systolic BP Percentile --      Boys Diastolic BP Percentile --      Pulse Rate 08/14/23 1051 (!) 102     Resp 08/14/23 1051 18     Temp 08/14/23 1051 98.3 F (36.8 C)     Temp Source 08/14/23 1051 Oral     SpO2 08/14/23 1051  98 %     Weight --      Height --      Head Circumference --      Peak Flow --      Pain Score 08/14/23 1049 10     Pain Loc --      Pain Education --      Exclude from Growth Chart --    No data found.  Updated Vital Signs BP 115/75 (BP Location: Right Arm)   Pulse 98   Temp 99.5 F (37.5 C) (Oral)   Resp 18   LMP 06/16/2023 (Approximate) Comment: neg UPT 07/20/23  SpO2 98%   Visual Acuity Right Eye Distance:   Left Eye Distance:   Bilateral Distance:    Right Eye Near:   Left Eye Near:    Bilateral Near:     Physical Exam Vitals and nursing note reviewed.  Constitutional:      General: She is not in acute distress.    Appearance: She is well-developed. She is not ill-appearing or toxic-appearing.  HENT:     Head: Normocephalic and atraumatic.     Right Ear: Hearing, tympanic membrane, ear canal and external ear normal.     Left Ear: Hearing, tympanic membrane, ear canal and external ear normal.     Nose: No congestion or rhinorrhea.     Right Sinus: No maxillary sinus tenderness or frontal sinus tenderness.     Left Sinus: No maxillary sinus tenderness or frontal sinus tenderness.     Mouth/Throat:     Lips: Pink.     Mouth: Mucous membranes are moist.     Pharynx: Uvula midline. No oropharyngeal exudate or posterior oropharyngeal erythema.     Tonsils: No tonsillar exudate.  Eyes:     Conjunctiva/sclera: Conjunctivae normal.     Pupils: Pupils are equal, round, and reactive to light.  Cardiovascular:     Rate  and Rhythm: Normal rate and regular rhythm.     Heart sounds: S1 normal and S2 normal. No murmur heard. Pulmonary:     Effort: Pulmonary effort is normal. No respiratory distress.     Breath sounds: Normal breath sounds. No decreased breath sounds, wheezing, rhonchi or rales.  Chest:  Breasts:    Right: Normal.     Left: Swelling (Left upper quadrant), skin change (Erythema and induration of left upper quadrant of breast near suture line of previous  lumpectomy incision.) and tenderness (Tenderness of left upper quadrant of breast near suture line of previous lumpectomy incision) present. No bleeding, inverted nipple, mass or nipple discharge.    Abdominal:     General: Bowel sounds are normal.     Palpations: Abdomen is soft.     Tenderness: There is no abdominal tenderness.  Musculoskeletal:        General: No swelling.     Cervical back: Neck supple.  Lymphadenopathy:     Head:     Right side of head: No submental, submandibular, tonsillar, preauricular or posterior auricular adenopathy.     Left side of head: No submental, submandibular, tonsillar, preauricular or posterior auricular adenopathy.     Cervical: No cervical adenopathy.     Right cervical: No superficial cervical adenopathy.    Left cervical: No superficial cervical adenopathy.  Skin:    General: Skin is warm and dry.     Capillary Refill: Capillary refill takes less than 2 seconds.     Findings: No rash.  Neurological:     Mental Status: She is alert and oriented to person, place, and time.  Psychiatric:        Mood and Affect: Mood normal.      UC Treatments / Results  Labs (all labs ordered are listed, but only abnormal results are displayed) Labs Reviewed - No data to display  EKG   Radiology No results found.  Procedures Procedures (including critical care time)  Medications Ordered in UC Medications  ibuprofen  (ADVIL ) tablet 800 mg (800 mg Oral Given 08/14/23 1134)    Initial Impression / Assessment and Plan / UC Course  I have reviewed the triage vital signs and the nursing notes.  Pertinent labs & imaging results that were available during my care of the patient were reviewed by me and considered in my medical decision making (see chart for details).  Plan of Care: Abscess and pain of left upper quadrant of the breast: Patient has pain at 10 out of 10 and mild to moderate fever.  Ibuprofen  800 mg given now.  Given the location and the  recent lumpectomy, she may need to have the incision opened and drained.  She may need IV antibiotics.  She is having severe pain.  Encouraged to go to the emergency room at [facility] for further evaluation and management.  Follow-up here as needed.  I reviewed the plan of care with the patient and/or the patient's guardian.  The patient and/or guardian had time to ask questions and acknowledged that the questions were answered.  I provided instruction on symptoms or reasons to return here or to go to an ER, if symptoms/condition did not improve, worsened or if new symptoms occurred.  Final Clinical Impressions(s) / UC Diagnoses   Final diagnoses:  Pain of left breast  Abscess of left breast     Discharge Instructions      Left breast pain and abscess: Patient has acute surgical incision that has  significant erythema and induration and is a probable abscess.  She has fever and pain at 10 out of 10.  Advised to go to an emergency room for further management.  I believe she might need the wound opened and drained and that she will probably need IV antibiotics.  Ibuprofen  800 mg given now for fever and pain.  Patient will transport to the emergency room at [facility] via family vehicle with her husband driving.  Follow-up here as needed.     ED Prescriptions   None    PDMP not reviewed this encounter.   Ival Domino, FNP 08/14/23 1141

## 2023-08-14 NOTE — Progress Notes (Signed)
 Patient ID: Jenny Hudson, female   DOB: September 12, 1970, 53 y.o.   MRN: [id number]   This is a patient of Dr. Ebbie who is status post a left breast lumpectomy and sentinel lymph node biopsy on 7/15.  Postoperatively she had a small seroma drained from her axilla which quickly resolved and she was seen 2 more times the office afterwards and was doing well.  Last evening she started developing pain in the left breast with fever and then erythema at the site of the lumpectomy.  Because of significant pain and erythema she went to an urgent care who then directed her to the emergency department.  On exam, there is significant cellulitis in the upper outer quadrant of the left breast around the lumpectomy incision.  The axillary incision is clean without evidence of erythema.  I discussed this with the patient and her husband.  She agreed to the attempted aspiration of the likely infected seroma.  A consent was signed.  I then inserted an 18-gauge needle and drained 30 cc of purulent fluid from the lipectomy site.  She tolerated this well.  There was no indentation of the breast after aspiration and her discomfort and erythema improved.  Discussed admission to the hospital versus close outpatient follow-up and she would like to be followed up as an outpatient.  She will receive a dose of IV antibiotics here in the emergency department and then I will place her on doxycycline  twice a day and we will see her in 2 days in the office for a follow-up.SABRA  She will call for worsening pain, fevers, or erythema.  She and her husband agree with the plans

## 2023-08-14 NOTE — ED Triage Notes (Signed)
 Pt with left breast lumpectomy on 7/15 with normal post op course and followup with Dr. Ebbie on 8/5.  Pt was doing well but yesterday began having pain and fevers up to 101 with redness to the left breast.  Called the on call and was told to go to [facility] or ED.  Pt went to UC first and was referred here.  Pt given 800 mg ibuprofen  at UC prior to arrival.

## 2023-08-14 NOTE — ED Provider Notes (Signed)
 [place] EMERGENCY DEPARTMENT AT [facility] Provider Note   CSN: [id number] Arrival date & time: 08/14/23  1223     Patient presents with: Fever   Jenny Hudson is a 53 y.o. female.   HPI 53 year old female with a history of breast cancer status post lumpectomy and sentinel node biopsy on 7/15 by Dr. Ebbie presents with concern for infection.  Yesterday she all of a sudden developed a headache and fever and chills.  Last night/early this morning she developed pain, swelling, and redness to the breast.  There has been no drainage from her breast or nipple.  Max temp was 101.  Went to urgent care and was sent here.  She has also called the on-call surgeon Dr. Vernetta.  Was given some ibuprofen  at urgent care.  Pain is currently severe.  Prior to Admission medications   Medication Sig Start Date End Date Taking? Authorizing Provider  doxycycline  (VIBRA -TABS) 100 MG tablet Take 1 tablet (100 mg total) by mouth 2 (two) times daily. 08/14/23  Yes Vernetta Berg, MD  oxyCODONE  (OXY IR/ROXICODONE ) 5 MG immediate release tablet Take 1 tablet (5 mg total) by mouth every 6 (six) hours as needed for moderate pain (pain score 4-6) or severe pain (pain score 7-10). 08/14/23  Yes Vernetta Berg, MD  albuterol (PROVENTIL HFA;VENTOLIN HFA) 108 (90 Base) MCG/ACT inhaler Inhale into the lungs every 6 (six) hours as needed for wheezing or shortness of breath.    [provider]  cetirizine (ZYRTEC) 10 MG tablet Take 10 mg by mouth daily. 02/16/21   [provider]  cyclobenzaprine (FLEXERIL) 10 MG tablet Take 10 mg by mouth at bedtime as needed. Patient not taking: No sig reported 03/14/21   [provider]  docusate sodium  (COLACE) 100 MG capsule Take 1 capsule (100 mg total) by mouth every 12 (twelve) hours. Patient not taking: No sig reported 05/27/17   Armenta Canning, MD  esomeprazole (NEXIUM) 40 MG capsule Take 40 mg by mouth daily at 12 noon.    [provider]  fluticasone-salmeterol (ADVAIR  DISKUS) 250-50 MCG/ACT AEPB Inhale 1 puff into the lungs in the morning and at bedtime. 06/10/21   Kara Dorn NOVAK, MD  gabapentin  (NEURONTIN ) 300 MG capsule Take 600 mg by mouth 3 (three) times daily. 06/06/21   [provider]  ibuprofen  (ADVIL ,MOTRIN ) 600 MG tablet TAKE 1 TABLET BY MOUTH FOUR TIMES A DAY FOR 5 DAYS THEN EVERY 6 HOURS IF NEEDED 05/24/17   [provider]  levocetirizine (XYZAL) 2.5 MG/5ML solution Take 2.5 mg by mouth every evening.    [provider]  meloxicam  (MOBIC ) 7.5 MG tablet Take 7.5 mg by mouth daily.    [provider]  montelukast  (SINGULAIR ) 10 MG tablet Take 1 tablet (10 mg total) by mouth at bedtime. 06/10/21   Kara Dorn NOVAK, MD  Multiple Vitamins-Minerals (MULTIVITAMIN WITH MINERALS) tablet Take 1 tablet by mouth daily.    [provider]  norethindrone-ethinyl estradiol-FE (LOESTRIN FE) 1-20 MG-MCG tablet Take 1 tablet by mouth daily. Patient not taking: No sig reported 05/30/21   [provider]  sertraline (ZOLOFT) 100 MG tablet Take 100 mg by mouth daily.    [provider]  traMADol  (ULTRAM ) 50 MG tablet Take 1 tablet (50 mg total) by mouth every 6 (six) hours as needed. 07/20/23   Ebbie Cough, MD    Allergies: Sulfa antibiotics    Review of Systems  Constitutional:  Positive for chills and fever.  HENT:  Negative for sore throat.   Respiratory:  Negative for cough.   Gastrointestinal:  Negative for vomiting.  Skin:  Positive for color change.    Updated Vital Signs BP (!) 140/85 (BP Location: Right Arm)   Pulse 97   Temp 98.1 F (36.7 C) (Oral)   Resp 18   LMP 06/16/2023 (Approximate) Comment: neg UPT 07/20/23  SpO2 95%   Physical Exam Vitals and nursing note reviewed. Exam conducted with a chaperone present.  Constitutional:      Appearance: She is well-developed. She is not ill-appearing or diaphoretic.  HENT:     Head:  Normocephalic and atraumatic.  Pulmonary:     Effort: Pulmonary effort is normal.  Chest:     Comments: See pictures of breast. She has diffuse erythema and some mild induration near the breast incision. The incision closer to her axilla seems unaffected. No fluctuance appreciated. Diffuse tenderness Skin:    General: Skin is warm and dry.  Neurological:     Mental Status: She is alert.        (all labs ordered are listed, but only abnormal results are displayed) Labs Reviewed  COMPREHENSIVE METABOLIC PANEL WITH GFR - Abnormal; Notable for the following components:      Result Value   Sodium 134 (*)    CO2 17 (*)    Calcium  8.4 (*)    Anion gap 17 (*)    All other components within normal limits  CBC WITH DIFFERENTIAL/PLATELET - Abnormal; Notable for the following components:   WBC 17.4 (*)    Hemoglobin 11.7 (*)    Neutro Abs 14.3 (*)    Monocytes Absolute 1.6 (*)    Abs Immature Granulocytes 0.11 (*)    All other components within normal limits    EKG: None  Radiology: No results found.   Procedures   Medications Ordered in the ED  sodium chloride  0.9 % bolus 1,000 mL (1,000 mLs Intravenous New Bag/Given 08/14/23 1337)  HYDROmorphone  (DILAUDID ) injection 0.5 mg (0.5 mg Intravenous Given 08/14/23 1331)  cefTRIAXone  (ROCEPHIN ) 1 g in sodium chloride  0.9 % 100 mL IVPB (0 g Intravenous Stopped 08/14/23 1407)                                    Medical Decision Making Amount and/or Complexity of Data Reviewed External Data Reviewed: notes. Labs: ordered.    Details: Leukocytosis. Mild anion gap.  Risk Prescription drug management.   Patient presents with a left breast infection.  She has had a seroma but now it appears that she has an abscess/cellulitis with acute swelling, redness, fever.  Her vital signs here are reassuring.  She does have a leukocytosis and a mild anion gap acidosis.  However she otherwise clinically does not appear septic.  Dr. Vernetta saw the  patient and was able to drain almost 30 cc of purulent material.  She is feeling a lot better.  Patient prefers to go home rather than be observed in the hospital which I think given her clinical well appearance is reasonable.  Dr. Vernetta has ordered her some antibiotics and pain control as an outpatient and will see her in 2 days in the clinic.  Otherwise, will give return precautions but appears stable for discharge.     Final diagnoses:  Left breast abscess    ED Discharge Orders          Ordered  doxycycline  (VIBRA -TABS) 100 MG tablet  2 times daily        08/14/23 1337    oxyCODONE  (OXY IR/ROXICODONE ) 5 MG immediate release tablet  Every 6 hours PRN        08/14/23 1337               Freddi Hamilton, MD 08/14/23 1444

## 2023-08-14 NOTE — Discharge Instructions (Signed)
 Left breast pain and abscess: Patient has acute surgical incision that has significant erythema and induration and is a probable abscess.  She has fever and pain at 10 out of 10.  Advised to go to an emergency room for further management.  I believe she might need the wound opened and drained and that she will probably need IV antibiotics.  Ibuprofen  800 mg given now for fever and pain.  Patient will transport to the emergency room at [facility] via family vehicle with her husband driving.  Follow-up here as needed.

## 2023-08-15 ENCOUNTER — Encounter (HOSPITAL_COMMUNITY): Payer: Self-pay

## 2023-08-15 ENCOUNTER — Telehealth: Payer: Self-pay | Admitting: Surgery

## 2023-08-15 ENCOUNTER — Inpatient Hospital Stay (HOSPITAL_COMMUNITY)
Admission: EM | Admit: 2023-08-15 | Discharge: 2023-08-17 | DRG: 585 | Disposition: A | Discharge status: Home / Self Care | Attending: General Surgery | Admitting: General Surgery

## 2023-08-15 ENCOUNTER — Other Ambulatory Visit: Payer: Self-pay

## 2023-08-15 DIAGNOSIS — R112 Nausea with vomiting, unspecified: Secondary | ICD-10-CM | POA: Diagnosis present

## 2023-08-15 DIAGNOSIS — G40909 Epilepsy, unspecified, not intractable, without status epilepticus: Secondary | ICD-10-CM | POA: Diagnosis present

## 2023-08-15 DIAGNOSIS — N611 Abscess of the breast and nipple: Secondary | ICD-10-CM | POA: Diagnosis present

## 2023-08-15 DIAGNOSIS — J301 Allergic rhinitis due to pollen: Secondary | ICD-10-CM | POA: Insufficient documentation

## 2023-08-15 DIAGNOSIS — K219 Gastro-esophageal reflux disease without esophagitis: Secondary | ICD-10-CM | POA: Diagnosis present

## 2023-08-15 DIAGNOSIS — Z87891 Personal history of nicotine dependence: Secondary | ICD-10-CM | POA: Diagnosis not present

## 2023-08-15 DIAGNOSIS — M47816 Spondylosis without myelopathy or radiculopathy, lumbar region: Secondary | ICD-10-CM | POA: Insufficient documentation

## 2023-08-15 DIAGNOSIS — Z8249 Family history of ischemic heart disease and other diseases of the circulatory system: Secondary | ICD-10-CM

## 2023-08-15 DIAGNOSIS — Z7951 Long term (current) use of inhaled steroids: Secondary | ICD-10-CM

## 2023-08-15 DIAGNOSIS — J3081 Allergic rhinitis due to animal (cat) (dog) hair and dander: Secondary | ICD-10-CM | POA: Insufficient documentation

## 2023-08-15 DIAGNOSIS — Z170 Estrogen receptor positive status [ER+]: Secondary | ICD-10-CM | POA: Diagnosis not present

## 2023-08-15 DIAGNOSIS — Z803 Family history of malignant neoplasm of breast: Secondary | ICD-10-CM

## 2023-08-15 DIAGNOSIS — E860 Dehydration: Secondary | ICD-10-CM | POA: Diagnosis present

## 2023-08-15 DIAGNOSIS — C50412 Malignant neoplasm of upper-outer quadrant of left female breast: Secondary | ICD-10-CM | POA: Diagnosis present

## 2023-08-15 DIAGNOSIS — Z825 Family history of asthma and other chronic lower respiratory diseases: Secondary | ICD-10-CM

## 2023-08-15 DIAGNOSIS — N644 Mastodynia: Secondary | ICD-10-CM | POA: Diagnosis present

## 2023-08-15 DIAGNOSIS — J4520 Mild intermittent asthma, uncomplicated: Secondary | ICD-10-CM | POA: Insufficient documentation

## 2023-08-15 DIAGNOSIS — H1045 Other chronic allergic conjunctivitis: Secondary | ICD-10-CM | POA: Insufficient documentation

## 2023-08-15 DIAGNOSIS — Z79899 Other long term (current) drug therapy: Secondary | ICD-10-CM | POA: Diagnosis not present

## 2023-08-15 DIAGNOSIS — J45909 Unspecified asthma, uncomplicated: Secondary | ICD-10-CM | POA: Diagnosis present

## 2023-08-15 DIAGNOSIS — Z791 Long term (current) use of non-steroidal anti-inflammatories (NSAID): Secondary | ICD-10-CM

## 2023-08-15 DIAGNOSIS — G8929 Other chronic pain: Secondary | ICD-10-CM | POA: Insufficient documentation

## 2023-08-15 LAB — BASIC METABOLIC PANEL WITH GFR
Anion gap: 8 (ref 5–15)
BUN: 14 mg/dL (ref 6–20)
CO2: 21 mmol/L — ABNORMAL LOW (ref 22–32)
Calcium: 8.5 mg/dL — ABNORMAL LOW (ref 8.9–10.3)
Chloride: 105 mmol/L (ref 98–111)
Creatinine, Ser: 0.77 mg/dL (ref 0.44–1.00)
GFR, Estimated: >60 mL/min (ref >60)
Glucose, Bld: 108 mg/dL — ABNORMAL HIGH (ref 70–99)
Potassium: 3.8 mmol/L (ref 3.5–5.1)
Sodium: 134 mmol/L — ABNORMAL LOW (ref 135–145)

## 2023-08-15 LAB — CBC
HCT: 34.2 % — ABNORMAL LOW (ref 36.0–46.0)
Hemoglobin: 10.9 g/dL — ABNORMAL LOW (ref 12.0–15.0)
MCH: 27.3 pg (ref 26.0–34.0)
MCHC: 31.9 g/dL (ref 30.0–36.0)
MCV: 85.7 fL (ref 80.0–100.0)
Platelets: 226 K/uL (ref 150–400)
RBC: 3.99 MIL/uL (ref 3.87–5.11)
RDW: 14.4 % (ref 11.5–15.5)
WBC: 13.6 K/uL — ABNORMAL HIGH (ref 4.0–10.5)
nRBC: 0 % (ref 0.0–0.2)

## 2023-08-15 LAB — I-STAT CG4 LACTIC ACID, ED: Lactic Acid, Venous: 0.6 mmol/L (ref 0.5–1.9)

## 2023-08-15 LAB — MRSA NEXT GEN BY PCR, NASAL: MRSA by PCR Next Gen: NOT DETECTED

## 2023-08-15 LAB — HIV ANTIBODY (ROUTINE TESTING W REFLEX): HIV Screen 4th Generation wRfx: NONREACTIVE

## 2023-08-15 MED ORDER — PHENOL 1.4 % MT LIQD: 2.0000 | OROMUCOSAL | Status: DC | PRN

## 2023-08-15 MED ORDER — ONDANSETRON HCL 4 MG/2ML IJ SOLN: 4.0000 mg | Freq: Four times a day (QID) | INTRAMUSCULAR | Status: DC | PRN

## 2023-08-15 MED ORDER — ACETAMINOPHEN 500 MG PO TABS
1000.0000 mg | ORAL_TABLET | Freq: Four times a day (QID) | ORAL | Status: DC
  Administered 2023-08-15 – 2023-08-17 (×8): 1000 mg via ORAL
  Filled 2023-08-15 (×5): qty 2

## 2023-08-15 MED ORDER — MENTHOL 3 MG MT LOZG: 1.0000 | LOZENGE | OROMUCOSAL | Status: DC | PRN

## 2023-08-15 MED ORDER — PROCHLORPERAZINE EDISYLATE 10 MG/2ML IJ SOLN: 5.0000 mg | INTRAMUSCULAR | Status: DC | PRN

## 2023-08-15 MED ORDER — ONDANSETRON 4 MG PO TBDP
4.0000 mg | ORAL_TABLET | Freq: Four times a day (QID) | ORAL | Status: DC | PRN
Start: 2023-08-15 — End: 2023-08-17

## 2023-08-15 MED ORDER — CELECOXIB 200 MG PO CAPS
200.0000 mg | ORAL_CAPSULE | ORAL | Status: DC
  Filled 2023-08-15: qty 1

## 2023-08-15 MED ORDER — ACETAMINOPHEN 650 MG RE SUPP: 650.0000 mg | Freq: Four times a day (QID) | RECTAL | Status: DC | PRN

## 2023-08-15 MED ORDER — SALINE SPRAY 0.65 % NA SOLN: 1.0000 | Freq: Four times a day (QID) | NASAL | Status: DC | PRN

## 2023-08-15 MED ORDER — LACTATED RINGERS IV BOLUS
1000.0000 mL | Freq: Once | INTRAVENOUS | Status: AC
  Administered 2023-08-15: 1000 mL via INTRAVENOUS

## 2023-08-15 MED ORDER — LACTATED RINGERS IV BOLUS: 1000.0000 mL | Freq: Three times a day (TID) | INTRAVENOUS | Status: DC | PRN

## 2023-08-15 MED ORDER — CALCIUM POLYCARBOPHIL 625 MG PO TABS
625.0000 mg | ORAL_TABLET | Freq: Two times a day (BID) | ORAL | Status: DC
  Administered 2023-08-15: 625 mg via ORAL
  Filled 2023-08-15 (×2): qty 1

## 2023-08-15 MED ORDER — MORPHINE SULFATE (PF) 4 MG/ML IV SOLN
4.0000 mg | Freq: Once | INTRAVENOUS | Status: AC
  Administered 2023-08-15: 4 mg via INTRAVENOUS
  Filled 2023-08-15: qty 1

## 2023-08-15 MED ORDER — HYDROMORPHONE HCL 1 MG/ML IJ SOLN
0.5000 mg | INTRAMUSCULAR | Status: DC | PRN
  Administered 2023-08-15: 1 mg via INTRAVENOUS
  Administered 2023-08-15 – 2023-08-16 (×9): 2 mg via INTRAVENOUS
  Filled 2023-08-15: qty 1
  Filled 2023-08-15 (×5): qty 2

## 2023-08-15 MED ORDER — METHOCARBAMOL 500 MG PO TABS: 1000.0000 mg | ORAL_TABLET | Freq: Four times a day (QID) | ORAL | Status: DC | PRN

## 2023-08-15 MED ORDER — CHLORHEXIDINE GLUCONATE CLOTH 2 % EX PADS
6.0000 | MEDICATED_PAD | Freq: Once | CUTANEOUS | Status: AC
  Administered 2023-08-15: 6 via TOPICAL

## 2023-08-15 MED ORDER — LACTATED RINGERS IV SOLN: INTRAVENOUS | Status: DC

## 2023-08-15 MED ORDER — METOPROLOL TARTRATE 5 MG/5ML IV SOLN: 5.0000 mg | Freq: Four times a day (QID) | INTRAVENOUS | Status: DC | PRN

## 2023-08-15 MED ORDER — SIMETHICONE 80 MG PO CHEW: 40.0000 mg | CHEWABLE_TABLET | Freq: Four times a day (QID) | ORAL | Status: DC | PRN

## 2023-08-15 MED ORDER — SODIUM CHLORIDE 0.9 % IV SOLN
1.0000 g | Freq: Once | INTRAVENOUS | Status: DC
  Filled 2023-08-15: qty 10

## 2023-08-15 MED ORDER — SODIUM CHLORIDE 0.9 % IV SOLN: INTRAVENOUS | Status: DC

## 2023-08-15 MED ORDER — ACETAMINOPHEN 325 MG PO TABS: 650.0000 mg | ORAL_TABLET | Freq: Four times a day (QID) | ORAL | Status: DC | PRN

## 2023-08-15 MED ORDER — OXYCODONE HCL 5 MG PO TABS
5.0000 mg | ORAL_TABLET | ORAL | Status: DC | PRN
  Administered 2023-08-15 – 2023-08-17 (×7): 10 mg via ORAL
  Filled 2023-08-15 (×4): qty 2

## 2023-08-15 MED ORDER — DIPHENHYDRAMINE HCL 50 MG/ML IJ SOLN: 12.5000 mg | Freq: Four times a day (QID) | INTRAMUSCULAR | Status: DC | PRN

## 2023-08-15 MED ORDER — SODIUM CHLORIDE 0.9 % IV SOLN: Freq: Three times a day (TID) | INTRAVENOUS | Status: DC | PRN

## 2023-08-15 MED ORDER — BISACODYL 10 MG RE SUPP: 10.0000 mg | Freq: Two times a day (BID) | RECTAL | Status: DC | PRN

## 2023-08-15 MED ORDER — METHOCARBAMOL 1000 MG/10ML IJ SOLN: 1000.0000 mg | Freq: Four times a day (QID) | INTRAMUSCULAR | Status: DC | PRN

## 2023-08-15 MED ORDER — GABAPENTIN 100 MG PO CAPS: 200.0000 mg | ORAL_CAPSULE | ORAL | Status: DC

## 2023-08-15 MED ORDER — ACETAMINOPHEN 500 MG PO TABS
1000.0000 mg | ORAL_TABLET | ORAL | Status: AC
  Administered 2023-08-16 (×2): 1000 mg via ORAL
  Filled 2023-08-15: qty 2

## 2023-08-15 MED ORDER — NAPHAZOLINE-GLYCERIN 0.012-0.25 % OP SOLN: 1.0000 [drp] | Freq: Four times a day (QID) | OPHTHALMIC | Status: DC | PRN

## 2023-08-15 MED ORDER — DIPHENHYDRAMINE HCL 12.5 MG/5ML PO ELIX: 12.5000 mg | ORAL_SOLUTION | Freq: Four times a day (QID) | ORAL | Status: DC | PRN

## 2023-08-15 MED ORDER — SODIUM CHLORIDE 0.9 % IV BOLUS: 1000.0000 mL | Freq: Three times a day (TID) | INTRAVENOUS | Status: DC | PRN

## 2023-08-15 MED ORDER — MAGIC MOUTHWASH: 15.0000 mL | Freq: Four times a day (QID) | ORAL | Status: DC | PRN

## 2023-08-15 NOTE — ED Provider Notes (Signed)
 [place] EMERGENCY DEPARTMENT AT [facility] Provider Note   CSN: [id number] Arrival date & time: 08/15/23  1615     Patient presents with: Fever   Jenny Hudson is a 53 y.o. female.    Fever    Patient has a history of epilepsy asthma breast cancer.  Patient had a lumpectomy procedure on July 15.  Patient states she had been recovering well.  Yesterday however she started having fever headaches and then she developed breast swelling and tenderness on the left side.  Patient was evaluated in the ED.  Patient was felt to have some findings suggestive of cellulitis.  Patient also had a needle aspiration performed by Dr. Vernetta.  He was able to drain approximately 30 cc of purulent material.  Patient was discharged home.  Patient states she started having increasing pain and drainage today.  She started have more fevers.  She called the surgeon's office and spoke with Dr. Sheldon and was told to come to the ED for additional treatment  Prior to Admission medications   Medication Sig Start Date End Date Taking? Authorizing Provider  albuterol (PROVENTIL HFA;VENTOLIN HFA) 108 (90 Base) MCG/ACT inhaler Inhale into the lungs every 6 (six) hours as needed for wheezing or shortness of breath.    [provider]  cetirizine (ZYRTEC) 10 MG tablet Take 10 mg by mouth daily. 02/16/21   [provider]  cyclobenzaprine (FLEXERIL) 10 MG tablet Take 10 mg by mouth at bedtime as needed. Patient not taking: No sig reported 03/14/21   [provider]  docusate sodium  (COLACE) 100 MG capsule Take 1 capsule (100 mg total) by mouth every 12 (twelve) hours. Patient not taking: No sig reported 05/27/17   Armenta Canning, MD  doxycycline  (VIBRA -TABS) 100 MG tablet Take 1 tablet (100 mg total) by mouth 2 (two) times daily. 08/14/23   Vernetta Berg, MD  esomeprazole (NEXIUM) 40 MG capsule Take 40 mg by mouth daily at 12 noon.    [provider]   fluticasone-salmeterol (ADVAIR  DISKUS) 250-50 MCG/ACT AEPB Inhale 1 puff into the lungs in the morning and at bedtime. 06/10/21   Kara Dorn NOVAK, MD  gabapentin  (NEURONTIN ) 300 MG capsule Take 600 mg by mouth 3 (three) times daily. 06/06/21   [provider]  ibuprofen  (ADVIL ,MOTRIN ) 600 MG tablet TAKE 1 TABLET BY MOUTH FOUR TIMES A DAY FOR 5 DAYS THEN EVERY 6 HOURS IF NEEDED 05/24/17   [provider]  levocetirizine (XYZAL) 2.5 MG/5ML solution Take 2.5 mg by mouth every evening.    [provider]  meloxicam  (MOBIC ) 7.5 MG tablet Take 7.5 mg by mouth daily.    [provider]  montelukast  (SINGULAIR ) 10 MG tablet Take 1 tablet (10 mg total) by mouth at bedtime. 06/10/21   Kara Dorn NOVAK, MD  Multiple Vitamins-Minerals (MULTIVITAMIN WITH MINERALS) tablet Take 1 tablet by mouth daily.    [provider]  norethindrone-ethinyl estradiol-FE (LOESTRIN FE) 1-20 MG-MCG tablet Take 1 tablet by mouth daily. Patient not taking: No sig reported 05/30/21   [provider]  oxyCODONE  (OXY IR/ROXICODONE ) 5 MG immediate release tablet Take 1 tablet (5 mg total) by mouth every 6 (six) hours as needed for moderate pain (pain score 4-6) or severe pain (pain score 7-10). 08/14/23   Vernetta Berg, MD  sertraline (ZOLOFT) 100 MG tablet Take 100 mg by mouth daily.    [provider]  traMADol  (ULTRAM ) 50 MG tablet Take 1 tablet (50 mg total)  by mouth every 6 (six) hours as needed. 07/20/23   Ebbie Cough, MD    Allergies: Sulfa antibiotics    Review of Systems  Constitutional:  Positive for fever.    Updated Vital Signs BP 116/75 (BP Location: Left Arm)   Pulse (!) 108   Temp 100.3 F (37.9 C) (Oral)   Resp 18   LMP 06/16/2023 (Approximate) Comment: neg UPT 07/20/23  SpO2 94%   Physical Exam Vitals and nursing note reviewed. Exam conducted with a chaperone present.  Constitutional:      Appearance: She is well-developed. She is not  diaphoretic.  HENT:     Head: Normocephalic and atraumatic.     Right Ear: External ear normal.     Left Ear: External ear normal.  Eyes:     General: No scleral icterus.       Right eye: No discharge.        Left eye: No discharge.     Conjunctiva/sclera: Conjunctivae normal.  Neck:     Trachea: No tracheal deviation.  Cardiovascular:     Rate and Rhythm: Normal rate and regular rhythm.  Pulmonary:     Effort: Pulmonary effort is normal. No respiratory distress.     Breath sounds: Normal breath sounds. No stridor. No wheezing or rales.  Chest:  Breasts:    Left: Swelling and tenderness present.     Comments: Chaperone present during breast exam, patient has purulent drainage from the left breast, appears to be coming from a punctate wound, likely the prior needle aspiration area, tenderness palpation Abdominal:     General: Bowel sounds are normal. There is no distension.     Palpations: Abdomen is soft.     Tenderness: There is no abdominal tenderness. There is no guarding or rebound.  Musculoskeletal:        General: No tenderness or deformity.     Cervical back: Neck supple.  Skin:    General: Skin is warm and dry.     Findings: No rash.  Neurological:     General: No focal deficit present.     Mental Status: She is alert.     Cranial Nerves: No cranial nerve deficit, dysarthria or facial asymmetry.     Sensory: No sensory deficit.     Motor: No abnormal muscle tone or seizure activity.     Coordination: Coordination normal.  Psychiatric:        Mood and Affect: Mood normal.     (all labs ordered are listed, but only abnormal results are displayed) Labs Reviewed  CBC - Abnormal; Notable for the following components:      Result Value   WBC 13.6 (*)    Hemoglobin 10.9 (*)    HCT 34.2 (*)    All other components within normal limits  CULTURE, BLOOD (ROUTINE X 2)  CULTURE, BLOOD (ROUTINE X 2)  MRSA NEXT GEN BY PCR, NASAL  BASIC METABOLIC PANEL WITH GFR  HIV  ANTIBODY (ROUTINE TESTING W REFLEX)  I-STAT CG4 LACTIC ACID, ED  I-STAT CG4 LACTIC ACID, ED    EKG: None  Radiology: No results found.   Procedures   Medications Ordered in the ED  sodium chloride  0.9 % bolus 1,000 mL (has no administration in time range)  lactated ringers  bolus 1,000 mL (has no administration in time range)  HYDROmorphone  (DILAUDID ) injection 0.5-2 mg (has no administration in time range)  oxyCODONE  (Oxy IR/ROXICODONE ) immediate release tablet 5-10 mg (has no administration in time range)  methocarbamol  (ROBAXIN ) injection 1,000 mg (has no administration in time range)  methocarbamol  (ROBAXIN ) tablet 1,000 mg (has no administration in time range)  acetaminophen  (TYLENOL ) tablet 1,000 mg (1,000 mg Oral Given 08/15/23 1842)  prochlorperazine  (COMPAZINE ) injection 5-10 mg (has no administration in time range)  phenol (CHLORASEPTIC) mouth spray 2 spray (has no administration in time range)  menthol -cetylpyridinium (CEPACOL) lozenge 3 mg (has no administration in time range)  magic mouthwash (has no administration in time range)  polycarbophil (FIBERCON) tablet 625 mg (has no administration in time range)  sodium chloride  (OCEAN) 0.65 % nasal spray 1-2 spray (has no administration in time range)  naphazoline-glycerin  (CLEAR EYES REDNESS) ophth solution 1-2 drop (has no administration in time range)  0.9 %  sodium chloride  infusion (has no administration in time range)  lactated ringers  infusion (has no administration in time range)  acetaminophen  (TYLENOL ) tablet 650 mg (has no administration in time range)    Or  acetaminophen  (TYLENOL ) suppository 650 mg (has no administration in time range)  diphenhydrAMINE  (BENADRYL ) 12.5 MG/5ML elixir 12.5 mg (has no administration in time range)    Or  diphenhydrAMINE  (BENADRYL ) injection 12.5 mg (has no administration in time range)  bisacodyl  (DULCOLAX) suppository 10 mg (has no administration in time range)  ondansetron   (ZOFRAN -ODT) disintegrating tablet 4 mg (has no administration in time range)    Or  ondansetron  (ZOFRAN ) injection 4 mg (has no administration in time range)  simethicone  (MYLICON) chewable tablet 40 mg (has no administration in time range)  metoprolol  tartrate (LOPRESSOR ) injection 5 mg (has no administration in time range)  Chlorhexidine  Gluconate Cloth 2 % PADS 6 each (has no administration in time range)  acetaminophen  (TYLENOL ) tablet 1,000 mg (has no administration in time range)  celecoxib  (CELEBREX ) capsule 200 mg (has no administration in time range)  gabapentin  (NEURONTIN ) capsule 200 mg (has no administration in time range)  lactated ringers  bolus 1,000 mL (1,000 mLs Intravenous New Bag/Given 08/15/23 1802)  morphine  (PF) 4 MG/ML injection 4 mg (4 mg Intravenous Given 08/15/23 1803)                                    Medical Decision Making Amount and/or Complexity of Data Reviewed Labs: ordered.  Risk Prescription drug management. Decision regarding hospitalization.   Patient presented to the ED for persistent symptoms associated with recent breast abscess.  Patient had aspiration in the ED yesterday by general surgery.  She started having fevers and recurrent drainage.  Patient does have leukocytosis but no signs of any lactic acidosis.  Doubt evolving sepsis at this time.  Patient was given IV fluids pain medications and antibiotics.  She was seen by Dr. Sheldon in the ED and the patient was admitted to the hospital for further treatment     Final diagnoses:  Breast abscess    ED Discharge Orders     None          Randol Simmonds, MD 08/15/23 2301

## 2023-08-15 NOTE — H&P (Signed)
 Jenny Hudson  07/21/1970 [id number]  CARE TEAM:  PCP: Dwight Trula SHAUNNA, MD  Outpatient Care Team: Patient Care Team: Dwight Trula SHAUNNA, MD as PCP - General (Internal Medicine) Baxter Drivers, MD as Referring Physician (Emergency Medicine) Tyree Nanetta SAILOR, RN as Oncology Nurse Navigator Ebbie Cough, MD as Consulting Physician (General Surgery) Odean Potts, MD as Consulting Physician (Hematology and Oncology) Shannon Agent, MD as Consulting Physician (Radiation Oncology)  Inpatient Treatment Team: Treatment Team:  [place], Md, MD Theodoro Delrae PEDLAR, RN [facility], Md, MD Odean Potts, MD Les Randolm CROME, Paramedic   This patient is a 53 y.o.female who presents today for surgical evaluation at the request of Dr. Randol, [facility] Emergency Department.   Chief complaint / Reason for evaluation: Worsening left breast swelling with probable abscess  53 year old female.  Diagnosed with breast cancer.  Underwent left breast lumpectomy and sentinel lymph node dissection last month by Dr. Ebbie 7/23.  Close outpatient follow-up.  Had early axillary seroma aspirated.  Oncotype concerning for some increased risk and felt to benefit from post adjuvant radiation therapy.  Due to see Dr. Shannon later this week.  Patient had worsening swelling and discomfort of left breast.  Came in yesterday with seroma aspirated by Dr. Vernetta.  Concern of some purulence of given IV antibiotics and sent with oral antibiotics.  Patient felt initially better but then through the night felt worse.  Nausea and vomiting.  I recommended ER evaluation.  Patient notes she has began to have some drainage from her incision as well.  Intermittent.  Husband at bedside.  She denies smoking.  Does not recall any history of MRSA or other infections.  Otherwise rather physically active.   Assessment  Jenny Hudson Nevin  54 y.o. female       Problem List:  Principal Problem:   Abscess of left breast Active Problems:    Nausea and vomiting   Dehydration   GERD (gastroesophageal reflux disease)   Malignant neoplasm of upper-outer quadrant of left breast in female, estrogen receptor positive (HCC)   Worsening breast swelling concerning for abscess  Plan:  IV antibiotics.  IV vancomycin  and piperacillin /tazobactam.  IV fluid resuscitation.  Nausea and pain control.  Patient did have some spontaneous drainage and feeling a little bit better.  However the cavity is not fully decompressed.  Very sensitive.  Did have some food today but vomited.  I think she would benefit from more aggressive drainage, possibly in the operating room.  Too sensitive to tolerate at the bedside and have recurrence with aspiration.  Dr. Ebbie had been aware and was wondering if it would come to that as well.  He is coming onto the [facility], LD[facility] surgical hospitalist inpatient service in the morning anyway, so after being at the same hospital where he will be working for evaluation and will make final plans.  I have tentatively posted we will make her n.p.o. in case surgery is needed.  -monitor electrolytes & replace as needed  Keep K>4, Mg>2, Phos>3  -VTE prophylaxis- SCDs.  Anticoagulation prophyllaxis SQ as appropriate.  Hold off on chemical VTE prophylaxis for now given bruising swelling and need for probably surgery/more aggressive drainage in the morning  -mobilize as tolerated to help recovery.  Enlist therapies in moderate/high risk patients as appropriate  I updated the patient's status to the patient and spouse recommendations were made.  Questions were answered.  They expressed understanding & appreciation.  -Disposition:  Disposition:  The patient  is from: Home Anticipate discharge to:  Home Anticipated Date of Discharge is:  August 12,2025   Barriers to discharge:  Pending Clinical improvement (more likely than not) and Need for inpatient procedure/study  Patient currently is NOT MEDICALLY STABLE for  discharge from the hospital from a surgery standpoint.     I reviewed nursing notes, hospitalist notes, last 24 h vitals and pain scores, last 48 h intake and output, last 24 h labs and trends, and last 24 h imaging results. I have reviewed this patient's available data, including medical history, events of note, test results, etc as part of my evaluation.  A significant portion of that time was spent in counseling.  Care during the described time interval was provided by me.  This care required moderate level of medical decision making.  08/15/2023  Jenny KYM Schultze, MD, FACS, MASCRS Esophageal, Gastrointestinal & Colorectal Surgery Robotic and Minimally Invasive Surgery  [facility] [address] [place], [place] [address] [phone] Fax [phone] Main  CONTACT INFORMATION: Weekday (9AM-5PM): Call [facility] main office at [phone] Weeknight (5PM-9AM) or Weekend/Holiday: Check EPIC Web Links tab & use AMION (password  [email or web address]) for General Surgery [facility] coverage  Please, DO NOT use SecureChat  (it is not reliable communication to reach operating surgeons & will lead to a delay in care).   Epic staff messaging available for outptient concerns needing 1-2 business day response.      08/15/2023      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 animals   Epilepsy (HCC)    as child; has been on phenobarbital and tegretal in the past, at around 53 yo, had repeat EEG which was normal, and was tapered off seizure medications   Erosive gastritis 03/15/2012   Family history of breast cancer    Family history of melanoma    Family history of stomach cancer    Pulse irregularity    Seasonal allergies     Past Surgical History:  Procedure Laterality Date   AXILLARY SENTINEL NODE BIOPSY Left 07/20/2023   Procedure: BIOPSY, LYMPH NODE, SENTINEL, AXILLARY;   Surgeon: Ebbie Cough, MD;  Location: [facility];  Service: General;  Laterality: Left;  LMA w/PEC BLOCK LEFT BREAST SEED GUIDED LUMPECTOMY LEFT AXILLARY SENTINEL NODE BIOPSY   BREAST BIOPSY     BREAST BIOPSY Left 06/22/2023   US  LT BREAST BX W LOC DEV 1ST LESION IMG BX SPEC US  GUIDE 06/22/2023 [facility] MAMMOGRAPHY   BREAST BIOPSY Left 07/16/2023   US  LT RADIOACTIVE SEED LOC 07/16/2023 [facility] MAMMOGRAPHY   BREAST LUMPECTOMY WITH RADIOACTIVE SEED LOCALIZATION Left 07/20/2023   Procedure: BREAST LUMPECTOMY WITH RADIOACTIVE SEED LOCALIZATION;  Surgeon: Ebbie Cough, MD;  Location: [facility];  Service: General;  Laterality: Left;   DENTAL SURGERY  05/23/2017   DILATION AND CURETTAGE OF UTERUS  2008   ESOPHAGOGASTRODUODENOSCOPY N/A 03/15/2012   Procedure: ESOPHAGOGASTRODUODENOSCOPY (EGD);  Surgeon: Lupita FORBES Commander, MD;  Location: [facility]COPY;  Service: Endoscopy;  Laterality: N/A;   HAND SURGERY  2001   repair of tendons, nerves after glass laceerated her hand.    HERNIA REPAIR  age 6   right inguinal.    TUBAL LIGATION  2000    Social History   Socioeconomic History   Marital status: Married    Spouse name: Justene Jensen   Number of  children: 4   Years of education: RN   Highest education level: Not on file  Occupational History   Occupation: RN E.D [employer]    Employer: [employer]    Employer: health system management  Tobacco Use   Smoking status: Former    Current packs/day: 0.00    Average packs/day: 1 pack/day for 20.0 years (20.0 ttl pk-yrs)    Types: Cigarettes, E-cigarettes    Start date: 08/08/1991    Quit date: 08/08/2011    Years since quitting: 12.0   Smokeless tobacco: Never   Tobacco comments:    quit jan 2012  Substance and Sexual Activity   Alcohol use: Yes    Comment: occasionally    Drug use: No   Sexual activity: Not on file  Other Topics Concern   Not on file  Social History Narrative   Not on file   Social  Drivers of Health   Financial Resource Strain: Not on file  Food Insecurity: No Food Insecurity (06/30/2023)   Hunger Vital Sign    Worried About Running Out of Food in the Last Year: Never true    Ran Out of Food in the Last Year: Never true  Transportation Needs: No Transportation Needs (06/30/2023)   PRAPARE - Administrator, Civil Service (Medical): No    Lack of Transportation (Non-Medical): No  Physical Activity: Not on file  Stress: Not on file  Social Connections: Not on file  Intimate Partner Violence: Not At Risk (06/30/2023)   Humiliation, Afraid, Rape, and Kick questionnaire    Fear of Current or Ex-Partner: No    Emotionally Abused: No    Physically Abused: No    Sexually Abused: No    Family History  Problem Relation Age of Onset   Breast cancer Mother 97       passed away in September 11, 2003   Hypertension Father    Hyperlipidemia Father    Asthma Father        heavy smoker   GER disease Father    Stomach cancer Maternal Uncle        late 40s-early 25s   Lung cancer Paternal Uncle        smoker   Melanoma Maternal Grandmother        dx in her late 30s-early 35s    Current Facility-Administered Medications  Medication Dose Route Frequency Provider Last Rate Last Admin   0.9 %  sodium chloride  infusion   Intravenous Continuous Sheldon Standing, MD       0.9 %  sodium chloride  infusion   Intravenous Q8H PRN Sheldon Standing, MD       acetaminophen  (TYLENOL ) tablet 650 mg  650 mg Oral Q6H PRN Sheldon Standing, MD       Or   acetaminophen  (TYLENOL ) suppository 650 mg  650 mg Rectal Q6H PRN Sheldon Standing, MD       acetaminophen  (TYLENOL ) tablet 1,000 mg  1,000 mg Oral Q6H Sheldon Standing, MD       bisacodyl  (DULCOLAX) suppository 10 mg  10 mg Rectal Q12H PRN Sheldon Standing, MD       diphenhydrAMINE  (BENADRYL ) 12.5 MG/5ML elixir 12.5 mg  12.5 mg Oral Q6H PRN Sheldon Standing, MD       Or   diphenhydrAMINE  (BENADRYL ) injection 12.5 mg  12.5 mg Intravenous Q6H PRN Sheldon Standing,  MD       HYDROmorphone  (DILAUDID ) injection 0.5-2 mg  0.5-2 mg Intravenous Q2H PRN Sheldon Standing, MD  lactated ringers  bolus 1,000 mL  1,000 mL Intravenous Q8H PRN Sheldon Standing, MD       lactated ringers  infusion   Intravenous Continuous Sheldon Standing, MD       magic mouthwash  15 mL Oral QID PRN Sheldon Standing, MD       menthol -cetylpyridinium (CEPACOL) lozenge 3 mg  1 lozenge Oral PRN Sheldon Standing, MD       methocarbamol  (ROBAXIN ) injection 1,000 mg  1,000 mg Intravenous Q6H PRN Sheldon Standing, MD       methocarbamol  (ROBAXIN ) tablet 1,000 mg  1,000 mg Oral Q6H PRN Sheldon Standing, MD       metoprolol  tartrate (LOPRESSOR ) injection 5 mg  5 mg Intravenous Q6H PRN Sheldon Standing, MD       naphazoline-glycerin  (CLEAR EYES REDNESS) ophth solution 1-2 drop  1-2 drop Both Eyes QID PRN Sheldon Standing, MD       ondansetron  (ZOFRAN -ODT) disintegrating tablet 4 mg  4 mg Oral Q6H PRN Sheldon Standing, MD       Or   ondansetron  (ZOFRAN ) injection 4 mg  4 mg Intravenous Q6H PRN Sheldon Standing, MD       oxyCODONE  (Oxy IR/ROXICODONE ) immediate release tablet 5-10 mg  5-10 mg Oral Q4H PRN Sheldon Standing, MD       phenol (CHLORASEPTIC) mouth spray 2 spray  2 spray Mouth/Throat PRN Sheldon Standing, MD       polycarbophil (FIBERCON) tablet 625 mg  625 mg Oral BID Sheldon Standing, MD       prochlorperazine  (COMPAZINE ) injection 5-10 mg  5-10 mg Intravenous Q4H PRN Sheldon Standing, MD       simethicone  [facility]) chewable tablet 40 mg  40 mg Oral Q6H PRN Sheldon Standing, MD       sodium chloride  (OCEAN) 0.65 % nasal spray 1-2 spray  1-2 spray Each Nare Q6H PRN Sheldon Standing, MD       sodium chloride  0.9 % bolus 1,000 mL  1,000 mL Intravenous Q8H PRN Sheldon Standing, MD       Current Outpatient Medications  Medication Sig Dispense Refill   albuterol (PROVENTIL HFA;VENTOLIN HFA) 108 (90 Base) MCG/ACT inhaler Inhale into the lungs every 6 (six) hours as needed for wheezing or shortness of breath.     cetirizine (ZYRTEC) 10 MG  tablet Take 10 mg by mouth daily.     cyclobenzaprine (FLEXERIL) 10 MG tablet Take 10 mg by mouth at bedtime as needed. (Patient not taking: No sig reported)     docusate sodium  (COLACE) 100 MG capsule Take 1 capsule (100 mg total) by mouth every 12 (twelve) hours. (Patient not taking: No sig reported) 60 capsule 0   doxycycline  (VIBRA -TABS) 100 MG tablet Take 1 tablet (100 mg total) by mouth 2 (two) times daily. 20 tablet 0   esomeprazole (NEXIUM) 40 MG capsule Take 40 mg by mouth daily at 12 noon.     fluticasone-salmeterol (ADVAIR  DISKUS) 250-50 MCG/ACT AEPB Inhale 1 puff into the lungs in the morning and at bedtime. 180 each 6   gabapentin  (NEURONTIN ) 300 MG capsule Take 600 mg by mouth 3 (three) times daily.     ibuprofen  (ADVIL ,MOTRIN ) 600 MG tablet TAKE 1 TABLET BY MOUTH FOUR TIMES A DAY FOR 5 DAYS THEN EVERY 6 HOURS IF NEEDED  0   levocetirizine (XYZAL) 2.5 MG/5ML solution Take 2.5 mg by mouth every evening.     meloxicam  (MOBIC ) 7.5 MG tablet Take 7.5 mg by mouth daily.     montelukast  (SINGULAIR )  10 MG tablet Take 1 tablet (10 mg total) by mouth at bedtime. 30 tablet 11   Multiple Vitamins-Minerals (MULTIVITAMIN WITH MINERALS) tablet Take 1 tablet by mouth daily.     norethindrone-ethinyl estradiol-FE (LOESTRIN FE) 1-20 MG-MCG tablet Take 1 tablet by mouth daily. (Patient not taking: No sig reported)     oxyCODONE  (OXY IR/ROXICODONE ) 5 MG immediate release tablet Take 1 tablet (5 mg total) by mouth every 6 (six) hours as needed for moderate pain (pain score 4-6) or severe pain (pain score 7-10). 20 tablet 0   sertraline (ZOLOFT) 100 MG tablet Take 100 mg by mouth daily.     traMADol  (ULTRAM ) 50 MG tablet Take 1 tablet (50 mg total) by mouth every 6 (six) hours as needed. 10 tablet 0     Allergies  Allergen Reactions   Sulfa Antibiotics Rash    ROS:   All other systems reviewed & are negative except per HPI or as noted below: Constitutional:  No fevers, chills, sweats.  Weight  stable Eyes:  No vision changes, No discharge HENT:  No sore throats, nasal drainage Lymph: No neck swelling, No bruising easily Pulmonary:  No cough, productive sputum CV: No orthopnea, PND  Patient walks 30 minutes without difficulty.  No exertional chest/neck/shoulder/arm pain.  GI:  No personal nor family history of GI/colon cancer, inflammatory bowel disease, irritable bowel syndrome, allergy such as Celiac Sprue, dietary/dairy problems, colitis, ulcers nor gastritis.  No recent sick contacts/gastroenteritis.  No travel outside the country.  No changes in diet.  Renal: No UTIs, No hematuria Genital:  No drainage, bleeding, masses Musculoskeletal: No severe joint pain.  Good ROM major joints Skin:  No sores or lesions Heme/Lymph:  No easy bleeding.  No swollen lymph nodes   BP 116/75 (BP Location: Left Arm)   Pulse (!) 108   Temp 100.3 F (37.9 C) (Oral)   Resp 18   LMP 06/16/2023 (Approximate) Comment: neg UPT 07/20/23  SpO2 94%   Physical Exam:  Constitutional: Not cachectic.  Hygeine adequate.  Vitals signs as above.   Eyes: Pupils reactive, normal extraocular movements. Sclera nonicteric Neuro: CN II-XII intact.  No major focal sensory defects.  No major motor deficits. Lymph: No head/neck/groin lymphadenopathy Psych:  No severe agitation.  No severe anxiety.  Judgment & insight Adequate, Oriented x4, HENT: Normocephalic, Mucus membranes moist.  No thrush.   Neck: Supple, No tracheal deviation.  No obvious thyromegaly  Left breast with swelling and erythema involving 50% of the breast.  12 x 10 cm region..  Left upper outer transverse incision mostly closed with pinhole opening with some seropurulent drainage with pressure.  No definite peau d'orange.  Axilla with normal healing ridge and no obvious drainage  Chest: No pain to chest wall compression.  Good respiratory excursion.  No audible wheezing CV:  Pulses intact.  regular rhythm.  No major extremity edema Abdomen:   Flat Hernia: Not present. Diastasis recti: Not present. Soft.   Nondistended.  Nontender.  No hepatomegaly.  No splenomegaly Rectal: (Deferred) Ext: No obvious deformity or contracture.  Edema: Not present.  No cyanosis Skin: No major subcutaneous nodules.  Warm and dry Musculoskeletal: Severe joint rigidity not present.  No obvious clubbing.  No digital petechiae.     Results:   Labs: Results for orders placed or performed during the hospital encounter of 08/15/23 (from the past 48 hours)  I-Stat CG4 Lactic Acid     Status: None   Collection Time: 08/15/23  6:06  PM  Result Value Ref Range   Lactic Acid, Venous 0.6 0.5 - 1.9 mmol/L    Imaging / Studies: MM Breast Surgical Specimen Result Date: 07/20/2023 CLINICAL DATA:  Post excision of a left breast lesion following radioactive seed localization. Assess surgical specimen. EXAM: SPECIMEN RADIOGRAPH OF THE LEFT BREAST COMPARISON:  Previous exam(s). FINDINGS: Status post excision of the left breast. The radioactive seed and biopsy marker clip are present, completely intact, and were marked for pathology. IMPRESSION: Specimen radiograph of the left breast. Electronically Signed   By: Alm Parkins M.D.   On: 07/20/2023 13:54    Medications / Allergies: per chart  Antibiotics: Anti-infectives (From admission, onward)    Start     Dose/Rate Route Frequency Ordered Stop   08/15/23 1715  cefTRIAXone  (ROCEPHIN ) 1 g in sodium chloride  0.9 % 100 mL IVPB  Status:  Discontinued        1 g 200 mL/hr over 30 Minutes Intravenous  Once 08/15/23 1707 08/15/23 1743         Note: Portions of this report may have been transcribed using voice recognition software. Every effort was made to ensure accuracy; however, inadvertent computerized transcription errors may be present.   Any transcriptional errors that result from this process are unintentional.    Jenny KYM Schultze, MD, FACS, MASCRS Esophageal, Gastrointestinal & Colorectal  Surgery Robotic and Minimally Invasive Surgery  [facility] [address], [address] [place], [place] [address] [phone] Fax [phone] Main  CONTACT INFORMATION: Weekday (9AM-5PM): Call [facility] main office at [phone] Weeknight (5PM-9AM) or Weekend/Holiday: Check EPIC Web Links tab & use AMION (password  [email or web address]) for General Surgery [facility] coverage  Please, DO NOT use SecureChat  (it is not reliable communication to reach operating surgeons & will lead to a delay in care).   Epic staff messaging available for outptient concerns needing 1-2 business day response.       08/15/2023  6:42 PM

## 2023-08-15 NOTE — ED Triage Notes (Signed)
 Pt CA recent lumpectomy and running fevers. Pt having drainage from left breast. Also having new vomiting.

## 2023-08-15 NOTE — Telephone Encounter (Signed)
 Patient with history of left breast cancer status post lumpectomy & lymph node sentinel lymph node biopsy 7/15 Dr. Ebbie.  Followed up with small seroma aspirated 7/22.  Better 7/28.  Referral made to consider post adjuvant therapies  Felt worse with left breast swelling and discomfort.  Came in the ER yesterday.  Given IV antibiotics.  Seroma aspirated in breast by Dr. Vernetta with some purulence.  Clinically felt much better.  Sent home on antibiotics.  Patient calls the following day, today with recurrent fevers and nausea vomiting, feeling worse.  I recommend she come the emergency department.  Most likely will need IV antibiotics, IV fluids, and possible more aggressive drainage in the operating room.  Given the fact that Dr. Ebbie will be at the [facility] this upcoming week running the surgical hospital service, recommend she come to [facility] so that he can help take care of her.

## 2023-08-16 ENCOUNTER — Encounter (HOSPITAL_COMMUNITY): Admission: EM | Disposition: A | Discharge status: Home / Self Care | Payer: Self-pay | Source: Home / Self Care

## 2023-08-16 ENCOUNTER — Other Ambulatory Visit: Payer: Self-pay

## 2023-08-16 ENCOUNTER — Encounter (HOSPITAL_COMMUNITY): Payer: Self-pay

## 2023-08-16 ENCOUNTER — Telehealth: Payer: Self-pay | Admitting: Radiation Oncology

## 2023-08-16 ENCOUNTER — Inpatient Hospital Stay (HOSPITAL_COMMUNITY): Admitting: Anesthesiology

## 2023-08-16 HISTORY — PX: INCISION AND DRAINAGE ABSCESS: SHX5864

## 2023-08-16 LAB — GLUCOSE, CAPILLARY: Glucose-Capillary: 69 mg/dL — ABNORMAL LOW (ref 70–99)

## 2023-08-16 LAB — SURGICAL PCR SCREEN
MRSA, PCR: NEGATIVE
Staphylococcus aureus: POSITIVE — AB

## 2023-08-16 SURGERY — INCISION AND DRAINAGE, ABSCESS
Anesthesia: General | Laterality: Left

## 2023-08-16 MED ORDER — CHLORHEXIDINE GLUCONATE 0.12 % MT SOLN
15.0000 mL | Freq: Once | OROMUCOSAL | Status: AC
  Administered 2023-08-16 (×2): 15 mL via OROMUCOSAL

## 2023-08-16 MED ORDER — BUPIVACAINE-EPINEPHRINE (PF) 0.25% -1:200000 IJ SOLN
INTRAMUSCULAR | Status: AC
  Filled 2023-08-16: qty 30

## 2023-08-16 MED ORDER — VANCOMYCIN HCL 1750 MG/350ML IV SOLN
1750.0000 mg | INTRAVENOUS | Status: DC
  Administered 2023-08-17 (×2): 1750 mg via INTRAVENOUS
  Filled 2023-08-16: qty 350

## 2023-08-16 MED ORDER — LACTATED RINGERS IV SOLN: INTRAVENOUS | Status: DC

## 2023-08-16 MED ORDER — 0.9 % SODIUM CHLORIDE (POUR BTL) OPTIME
TOPICAL | Status: DC | PRN
Start: 2023-08-16 — End: 2023-08-16
  Administered 2023-08-16 (×2): 1000 mL

## 2023-08-16 MED ORDER — MIDAZOLAM HCL 2 MG/2ML IJ SOLN
INTRAMUSCULAR | Status: AC
  Filled 2023-08-16: qty 2

## 2023-08-16 MED ORDER — PROPOFOL 10 MG/ML IV BOLUS
INTRAVENOUS | Status: DC | PRN
  Administered 2023-08-16 (×2): 120 mg via INTRAVENOUS

## 2023-08-16 MED ORDER — DEXAMETHASONE SODIUM PHOSPHATE 10 MG/ML IJ SOLN
INTRAMUSCULAR | Status: DC | PRN
  Administered 2023-08-16 (×2): 10 mg via INTRAVENOUS

## 2023-08-16 MED ORDER — BACITRACIN ZINC 500 UNIT/GM EX OINT
TOPICAL_OINTMENT | CUTANEOUS | Status: DC | PRN
  Administered 2023-08-16 (×2): 1 via TOPICAL

## 2023-08-16 MED ORDER — ONDANSETRON HCL 4 MG/2ML IJ SOLN
INTRAMUSCULAR | Status: DC | PRN
  Administered 2023-08-16 (×2): 4 mg via INTRAVENOUS

## 2023-08-16 MED ORDER — MUPIROCIN 2 % EX OINT
1.0000 | TOPICAL_OINTMENT | Freq: Two times a day (BID) | CUTANEOUS | Status: DC
  Administered 2023-08-16 – 2023-08-17 (×6): 1 via NASAL
  Filled 2023-08-16: qty 22

## 2023-08-16 MED ORDER — PROPOFOL 10 MG/ML IV BOLUS
INTRAVENOUS | Status: AC
  Filled 2023-08-16: qty 20

## 2023-08-16 MED ORDER — PIPERACILLIN-TAZOBACTAM 3.375 G IVPB
3.3750 g | Freq: Three times a day (TID) | INTRAVENOUS | Status: DC
  Administered 2023-08-16 – 2023-08-17 (×6): 3.375 g via INTRAVENOUS
  Filled 2023-08-16 (×4): qty 50

## 2023-08-16 MED ORDER — LIDOCAINE HCL (CARDIAC) PF 100 MG/5ML IV SOSY
PREFILLED_SYRINGE | INTRAVENOUS | Status: DC | PRN
  Administered 2023-08-16 (×2): 60 mg via INTRAVENOUS

## 2023-08-16 MED ORDER — CHLORHEXIDINE GLUCONATE CLOTH 2 % EX PADS
6.0000 | MEDICATED_PAD | Freq: Every day | CUTANEOUS | Status: DC
  Administered 2023-08-16 – 2023-08-17 (×4): 6 via TOPICAL

## 2023-08-16 MED ORDER — FENTANYL CITRATE (PF) 100 MCG/2ML IJ SOLN
INTRAMUSCULAR | Status: AC
  Filled 2023-08-16: qty 2

## 2023-08-16 MED ORDER — FENTANYL CITRATE (PF) 250 MCG/5ML IJ SOLN
INTRAMUSCULAR | Status: DC | PRN
  Administered 2023-08-16 (×2): 50 ug via INTRAVENOUS

## 2023-08-16 MED ORDER — MIDAZOLAM HCL 2 MG/2ML IJ SOLN
INTRAMUSCULAR | Status: DC | PRN
  Administered 2023-08-16 (×2): 2 mg via INTRAVENOUS

## 2023-08-16 MED ORDER — BACITRACIN ZINC 500 UNIT/GM EX OINT
TOPICAL_OINTMENT | CUTANEOUS | Status: AC
Start: 2023-08-16 — End: 2023-08-16
  Filled 2023-08-16: qty 28.35

## 2023-08-16 MED ORDER — VANCOMYCIN HCL 2000 MG/400ML IV SOLN
2000.0000 mg | Freq: Once | INTRAVENOUS | Status: AC
  Administered 2023-08-16 (×2): 2000 mg via INTRAVENOUS
  Filled 2023-08-16: qty 400

## 2023-08-16 MED ORDER — BUPIVACAINE-EPINEPHRINE 0.25% -1:200000 IJ SOLN
INTRAMUSCULAR | Status: DC | PRN
  Administered 2023-08-16 (×2): 3 mL

## 2023-08-16 SURGICAL SUPPLY — 37 items
BAG COUNTER SPONGE SURGICOUNT (BAG) IMPLANT
BIOPATCH BLUE 3/4IN DISK W/1.5 (GAUZE/BANDAGES/DRESSINGS) IMPLANT
BLADE HEX COATED 2.75 (ELECTRODE) ×1 IMPLANT
BLADE SURG SZ10 CARB STEEL (BLADE) ×1 IMPLANT
COVER SURGICAL LIGHT HANDLE (MISCELLANEOUS) ×1 IMPLANT
DERMABOND ADVANCED .7 DNX12 (GAUZE/BANDAGES/DRESSINGS) IMPLANT
DRAIN CHANNEL 19F RND (DRAIN) IMPLANT
DRAPE LAPAROSCOPIC ABDOMINAL (DRAPES) IMPLANT
DRAPE LAPAROTOMY T 102X78X121 (DRAPES) IMPLANT
DRAPE LAPAROTOMY TRNSV 102X78 (DRAPES) IMPLANT
DRAPE SHEET LG 3/4 BI-LAMINATE (DRAPES) IMPLANT
DRSG TEGADERM 6X8 (GAUZE/BANDAGES/DRESSINGS) IMPLANT
ELECT REM PT RETURN 15FT ADLT (MISCELLANEOUS) ×1 IMPLANT
EVACUATOR SILICONE 100CC (DRAIN) IMPLANT
GAUZE SPONGE 4X4 12PLY STRL (GAUZE/BANDAGES/DRESSINGS) ×1 IMPLANT
GLOVE BIO SURGEON STRL SZ7 (GLOVE) ×3 IMPLANT
GLOVE BIOGEL PI IND STRL 7.0 (GLOVE) ×1 IMPLANT
GLOVE BIOGEL PI IND STRL 7.5 (GLOVE) ×3 IMPLANT
GOWN STRL REUS W/ TWL LRG LVL3 (GOWN DISPOSABLE) ×2 IMPLANT
GOWN STRL REUS W/ TWL XL LVL3 (GOWN DISPOSABLE) ×1 IMPLANT
KIT BASIN OR (CUSTOM PROCEDURE TRAY) ×1 IMPLANT
KIT TURNOVER KIT A (KITS) ×1 IMPLANT
MARKER SKIN DUAL TIP RULER LAB (MISCELLANEOUS) ×1 IMPLANT
NDL HYPO 25X1 1.5 SAFETY (NEEDLE) ×1 IMPLANT
NEEDLE HYPO 25X1 1.5 SAFETY (NEEDLE) ×1 IMPLANT
NS IRRIG 1000ML POUR BTL (IV SOLUTION) ×1 IMPLANT
PACK BASIC VI WITH GOWN DISP (CUSTOM PROCEDURE TRAY) ×1 IMPLANT
PENCIL SMOKE EVACUATOR (MISCELLANEOUS) IMPLANT
SPIKE FLUID TRANSFER (MISCELLANEOUS) IMPLANT
SPONGE T-LAP 4X18 ~~LOC~~+RFID (SPONGE) IMPLANT
STAPLER SKIN PROX 35W (STAPLE) IMPLANT
SUT ETHILON 2 0 PS N (SUTURE) IMPLANT
SUT ETHILON 3 0 PS 1 (SUTURE) IMPLANT
SUT MNCRL AB 4-0 PS2 18 (SUTURE) IMPLANT
SUT VIC AB 3-0 SH 18 (SUTURE) IMPLANT
SYR CONTROL 10ML LL (SYRINGE) ×1 IMPLANT
TOWEL OR 17X26 10 PK STRL BLUE (TOWEL DISPOSABLE) ×1 IMPLANT

## 2023-08-16 NOTE — Transfer of Care (Signed)
 Immediate Anesthesia Transfer of Care Note  Patient: Jenny Hudson  Procedure(s) Performed: INCISION AND DRAINAGE, ABSCESS (Left)  Patient Location: PACU  Anesthesia Type:General  Level of Consciousness: drowsy and patient cooperative  Airway & Oxygen Therapy: Patient Spontanous Breathing and Patient connected to face mask oxygen  Post-op Assessment: Report given to RN and Post -op Vital signs reviewed and stable  Post vital signs: Reviewed and stable  Last Vitals:  Vitals Value Taken Time  BP 112/65 08/16/23 10:57  Temp    Pulse 95 08/16/23 11:00  Resp 14 08/16/23 11:00  SpO2 100 % 08/16/23 11:00  Vitals shown include unfiled device data.  Last Pain:  Vitals:   08/16/23 0947  TempSrc: Oral  PainSc: 7       Patients Stated Pain Goal: 2 (08/16/23 0947)  Complications: No notable events documented.

## 2023-08-16 NOTE — Telephone Encounter (Signed)
 Left message for patient to call back to reschedule upcoming appointment per provider's request.

## 2023-08-16 NOTE — Op Note (Signed)
 Preoperative diagnosis: Left breast abscess Postoperative diagnosis: Same as above Procedure: Incision and drainage of left breast abscess Surgeon: Dr. Adina Bury Anesthesia: General Specimens: Cultures to microbiology Estimated blood loss: Minimal Complications: None Drains: 19 French Blake drain to left breast lumpectomy site Sponge needle count was correct completion Disposition recovery stable condition  Indications: This is a 53 year old female who underwent a left lumpectomy and sentinel lymph node biopsy on July 15.  She returns with a delayed abscess.  This was attempted to be treated over the weekend conservatively but that has not been successful.  I discussed going to the operating room for drainage today.  Procedure: After informed consent was obtained she was taken to the operating.  She was given antibiotics.  SCDs were placed.  She was placed under general anesthesia without complication.  She was prepped and draped in standard sterile surgical fashion.  Surgical timeout was then performed.  I have titrated lidocaine  and her old incision.  I then opened this for some of the old incision and was able to culture some purulence.  This was completely evacuated.  This was irrigated.  This was a fairly large cavity that I had close down before.  I ended up electing just to place a close suction drain.  I brought this out from the skin remote from the incision.  I then sutured this in place with a 2-0 nylon suture.  I then closed the skin with 3-0 Vicryl once with 3 oh nylons externally.  She tolerated this well and was transferred recovery stable.

## 2023-08-16 NOTE — Progress Notes (Signed)
*   Day of Surgery *   Subjective/Chief Complaint: Feels ok   Objective: Vital signs in last 24 hours: Temp:  [98.2 F (36.8 C)-100.3 F (37.9 C)] 99.8 F (37.7 C) (08/11 0554) Pulse Rate:  [77-108] 95 (08/11 0554) Resp:  [15-18] 16 (08/11 0554) BP: (105-116)/(58-75) 113/58 (08/11 0554) SpO2:  [94 %-97 %] 95 % (08/11 0554)    Intake/Output from previous day: 08/10 0701 - 08/11 0700 In: 1433.3 [P.O.:360; I.V.:1073.3] Out: -  Intake/Output this shift: No intake/output data recorded.  Left breast with some erythema and purulent drainage  Lab Results:  Recent Labs    08/14/23 1312 08/15/23 1805  WBC 17.4* 13.6*  HGB 11.7* 10.9*  HCT 37.9 34.2*  PLT 233 226   BMET Recent Labs    08/14/23 1312 08/15/23 1805  NA 134* 134*  K 3.6 3.8  CL 100 105  CO2 17* 21*  GLUCOSE 93 108*  BUN 19 14  CREATININE 0.80 0.77  CALCIUM  8.4* 8.5*   PT/INR No results for input(s): LABPROT, INR in the last 72 hours. ABG No results for input(s): PHART, HCO3 in the last 72 hours.  Invalid input(s): PCO2, PO2  Studies/Results: No results found.  Anti-infectives: Anti-infectives (From admission, onward)    Start     Dose/Rate Route Frequency Ordered Stop   08/16/23 0930  vancomycin  (VANCOREADY) IVPB 2000 mg/400 mL        2,000 mg 200 mL/hr over 120 Minutes Intravenous  Once 08/16/23 0832     08/16/23 0930  piperacillin -tazobactam (ZOSYN ) IVPB 3.375 g        3.375 g 12.5 mL/hr over 240 Minutes Intravenous Every 8 hours 08/16/23 0832     08/15/23 1715  cefTRIAXone  (ROCEPHIN ) 1 g in sodium chloride  0.9 % 100 mL IVPB  Status:  Discontinued        1 g 200 mL/hr over 30 Minutes Intravenous  Once 08/15/23 1707 08/15/23 1743       Assessment/Plan: Left breast abscess s/p lumpectomy/sn -I wrote for abx- not written for yet -I think just needs operative drainage and discussed closure, penrose vs closed suction drain -will let rad onc know -npo for surgery  today    Donnice Bury 08/16/2023

## 2023-08-16 NOTE — Anesthesia Preprocedure Evaluation (Signed)
 Anesthesia Evaluation  Patient identified by MRN, date of birth, ID band Patient awake    Reviewed: Allergy & Precautions, NPO status , Patient's Chart, lab work & pertinent test results  History of Anesthesia Complications Negative for: history of anesthetic complications  Airway Mallampati: II  TM Distance: >3 FB Neck ROM: Full    Dental no notable dental hx. (+) Teeth Intact   Pulmonary neg pulmonary ROS, asthma , neg sleep apnea, neg COPD, Patient abstained from smoking.Not current smoker, former smoker   Pulmonary exam normal breath sounds clear to auscultation       Cardiovascular Exercise Tolerance: Good METS(-) hypertension(-) CAD and (-) Past MI negative cardio ROS (-) dysrhythmias  Rhythm:Regular Rate:Normal - Systolic murmurs    Neuro/Psych neg Seizures Seizures hx in past negative neurological ROS  negative psych ROS   GI/Hepatic PUD,GERD  Controlled,,(+)     (-) substance abuse  Had some vomiting yesterday, none today. Tolerated broth yesterday   Endo/Other  neg diabetes    Renal/GU negative Renal ROS     Musculoskeletal   Abdominal   Peds  Hematology   Anesthesia Other Findings Past Medical History: No date: Anemia No date: Asthma     Comment:  allergy induced, allergic to long haired animals No date: Epilepsy (HCC)     Comment:  as child; has been on phenobarbital and tegretal in the               past, at around 53 yo, had repeat EEG which was normal,               and was tapered off seizure medications 03/15/2012: Erosive gastritis No date: Family history of breast cancer No date: Family history of melanoma No date: Family history of stomach cancer No date: Pulse irregularity No date: Seasonal allergies  Reproductive/Obstetrics                              Anesthesia Physical Anesthesia Plan  ASA: 2  Anesthesia Plan: General   Post-op Pain Management:  Ofirmev  IV (intra-op)*   Induction: Intravenous  PONV Risk Score and Plan: 3 and Ondansetron , Dexamethasone  and Midazolam   Airway Management Planned: LMA  Additional Equipment: None  Intra-op Plan:   Post-operative Plan: Extubation in OR  Informed Consent: I have reviewed the patients History and Physical, chart, labs and discussed the procedure including the risks, benefits and alternatives for the proposed anesthesia with the patient or authorized representative who has indicated his/her understanding and acceptance.     Dental advisory given  Plan Discussed with: CRNA and Surgeon  Anesthesia Plan Comments: (Discussed risks of anesthesia with patient, including PONV, sore throat, lip/dental/eye damage. Rare risks discussed as well, such as cardiorespiratory and neurological sequelae, and allergic reactions. Discussed the role of CRNA in patient's perioperative care. Patient understands.)         Anesthesia Quick Evaluation

## 2023-08-16 NOTE — Anesthesia Postprocedure Evaluation (Signed)
 Anesthesia Post Note  Patient: Jenny Hudson  Procedure(s) Performed: INCISION AND DRAINAGE, ABSCESS (Left)     Patient location during evaluation: PACU Anesthesia Type: General Level of consciousness: awake and alert Pain management: pain level controlled Vital Signs Assessment: post-procedure vital signs reviewed and stable Respiratory status: spontaneous breathing, nonlabored ventilation, respiratory function stable and patient connected to nasal cannula oxygen Cardiovascular status: blood pressure returned to baseline and stable Postop Assessment: no apparent nausea or vomiting Anesthetic complications: no   No notable events documented.  Last Vitals:  Vitals:   08/16/23 1130 08/16/23 1139  BP:  99/73  Pulse: 77 79  Resp: 11 16  Temp:  36.7 C  SpO2: 96% 98%    Last Pain:  Vitals:   08/16/23 1143  TempSrc:   PainSc: 7                  Rome Ade

## 2023-08-16 NOTE — Progress Notes (Signed)
 Pharmacy Antibiotic Note  Jenny Hudson is a 53 y.o. female admitted on 08/15/2023 with cellulitis.  Pharmacy has been consulted for vancomycin  and Zosyn  dosing.  S/p lumpectomy procedure in July. Then developed swelling and tenderness in breast with fever. Needle aspiration done but pain and drainage worsening. Was given doxycycline  at the time. Plan to take to OR for I&D today.  Plan: Give vancomycin  2g IV x 1, then start vancomycin  1,750mg  IV Q24h  Start Zosyn  3.375 gm IV q8h (4 hour infusion) Monitor clinical picture, renal function, vanc levels prn F/U I&D, C&S, abx deescalation / LOT  Temp (24hrs), Avg:99.4 F (37.4 C), Min:98.2 F (36.8 C), Max:100.3 F (37.9 C)  Recent Labs  Lab 08/14/23 1312 08/15/23 1805 08/15/23 1806  WBC 17.4* 13.6*  --   CREATININE 0.80 0.77  --   LATICACIDVEN  --   --  0.6    CrCl cannot be calculated (Unknown ideal weight.).    Allergies  Allergen Reactions   Sulfa Antibiotics Rash   Antimicrobials this admission: PTA doxy Vancomycin  8/11 >> Zosyn  8/11 >>  Dose adjustments this admission:   Microbiology results: 8/10 BCx: ngtd 8/10 MRSA PCR: positive for staph (MSSA)  Thank you for allowing pharmacy to be a part of this patient's care.  Rankin Dee, PharmD, BCPS, BCIDP Clinical Pharmacist 08/16/2023 8:35 AM

## 2023-08-16 NOTE — Plan of Care (Signed)

## 2023-08-17 ENCOUNTER — Ambulatory Visit: Admitting: Radiation Oncology

## 2023-08-17 ENCOUNTER — Encounter: Payer: Self-pay | Admitting: *Deleted

## 2023-08-17 ENCOUNTER — Encounter (HOSPITAL_COMMUNITY): Payer: Self-pay | Admitting: General Surgery

## 2023-08-17 ENCOUNTER — Ambulatory Visit

## 2023-08-17 MED ORDER — DOXYCYCLINE HYCLATE 100 MG PO TABS: 100.0000 mg | ORAL_TABLET | Freq: Two times a day (BID) | ORAL | 0 refills | Status: DC

## 2023-08-17 NOTE — Progress Notes (Addendum)
 Antibiotics are infusing. Patient is aware of need to continue before discharge is complete. Discharge instructions were reviewed with the patient. She denied questions or concerns at this time.Dressing supplies provided for JP/incision site. Pt states that provider will remove during follow up visit.

## 2023-08-17 NOTE — Discharge Summary (Signed)
 Physician Discharge Summary  Patient ID: Jenny Hudson MRN: [id number] DOB/AGE: 1970/09/23 53 y.o.  Admit date: 08/15/2023 Discharge date: 08/17/2023  Admission Diagnoses: Left breast abscess s/p lumpectomy  Discharge Diagnoses:  Principal Problem:   Abscess of left breast Active Problems:   Nausea and vomiting   Dehydration   GERD (gastroesophageal reflux disease)   Malignant neoplasm of upper-outer quadrant of left breast in female, estrogen receptor positive (HCC)   Discharged Condition: good  Hospital Course: 8 yof s/p lumpectomy/sn biopsy. Had small seroma immediately after surgery that was aspirated. Then doing well but later developed infection. Was attempted to be managed with abx only but this was not successful. I took to OR and did drainage with culture showing gpcs so far.  She is doing well and I will dc her.  Consults: None  Significant Diagnostic Studies: none  Treatments: surgery: left breast abscess drainage  Discharge Exam: Blood pressure 96/73, pulse (!) 51, temperature (!) 97.5 F (36.4 C), temperature source Oral, resp. rate 17, height 5' 7 (1.702 m), weight 93.4 kg, last menstrual period 07/21/2023, SpO2 98%. Drain serous  Disposition: Discharge disposition: 01-Home or Self Care        Allergies as of 08/17/2023       Reactions   Sulfa Antibiotics Rash        Medication List     TAKE these medications    acetaminophen  500 MG tablet Commonly known as: TYLENOL  Take 500-1,000 mg by mouth every 8 (eight) hours as needed.   Advair  Diskus 250-50 MCG/ACT Aepb Generic drug: fluticasone-salmeterol Inhale 1 puff into the lungs in the morning and at bedtime.   Airsupra 90-80 MCG/ACT Aero Generic drug: Albuterol-Budesonide Inhale 2 puffs into the lungs every 2 (two) hours as needed (shortness of breath).   doxycycline  100 MG tablet Commonly known as: VIBRA -TABS Take 1 tablet (100 mg total) by mouth 2 (two) times daily. What changed:  Another medication with the same name was added. Make sure you understand how and when to take each.   doxycycline  100 MG tablet Commonly known as: VIBRA -TABS Take 1 tablet (100 mg total) by mouth 2 (two) times daily. What changed: You were already taking a medication with the same name, and this prescription was added. Make sure you understand how and when to take each.   EPINEPHrine  0.3 mg/0.3 mL Soaj injection Commonly known as: EPI-PEN Inject 0.3 mg into the muscle as needed for anaphylaxis.   esomeprazole 40 MG capsule Commonly known as: NEXIUM Take 40 mg by mouth daily at 12 noon.   ibuprofen  600 MG tablet Commonly known as: ADVIL  TAKE 1 TABLET BY MOUTH FOUR TIMES A DAY FOR 5 DAYS THEN EVERY 6 HOURS IF NEEDED   levocetirizine 2.5 MG/5ML solution Commonly known as: XYZAL Take 2.5 mg by mouth every evening.   montelukast  10 MG tablet Commonly known as: SINGULAIR  Take 1 tablet (10 mg total) by mouth at bedtime.   multivitamin with minerals tablet Take 1 tablet by mouth daily.   oxyCODONE  5 MG immediate release tablet Commonly known as: Oxy IR/ROXICODONE  Take 1 tablet (5 mg total) by mouth every 6 (six) hours as needed for moderate pain (pain score 4-6) or severe pain (pain score 7-10).   sertraline 100 MG tablet Commonly known as: ZOLOFT Take 100 mg by mouth daily.        Follow-up Information     Ebbie Cough, MD Follow up on 08/27/2023.   Specialty: General Surgery Why: 1045 am Contact  information: [address] [place] [address] [phone]                 Signed: Donnice Bury 08/17/2023, 9:19 AM

## 2023-08-17 NOTE — Progress Notes (Signed)
   08/17/23 1020  TOC Brief Assessment  Patient has primary care physician Yes  Home environment has been reviewed resides in private residence with spouse  Prior level of function: Independent  Prior/Current Home Services No current home services  Social Drivers of Health Review SDOH reviewed no interventions necessary  Readmission risk has been reviewed Yes  Transition of care needs no transition of care needs at this time

## 2023-08-18 ENCOUNTER — Ambulatory Visit: Admitting: Radiation Oncology

## 2023-08-18 ENCOUNTER — Ambulatory Visit

## 2023-08-18 NOTE — Assessment & Plan Note (Signed)
 06/22/2023:Screening mammogram detected left breast asymmetry/mass measuring 1.1 cm, axilla negative, ultrasound biopsy: Grade 2 IDC ER 99%, PR 100%, Ki67 2%, HER2 negative   Recommendations: 1. 07/20/23: Left Lumpectomy: Grade 2 IDC with DCIS 2.5 cm, ER 99%, PR 100%, Ki67 2%, HER2 negative 2. Oncotype DX testing: 11 (ROR 3%) 3. Adjuvant radiation therapy followed by 4. Adjuvant antiestrogen therapy

## 2023-08-19 ENCOUNTER — Telehealth: Payer: Self-pay | Admitting: *Deleted

## 2023-08-19 ENCOUNTER — Inpatient Hospital Stay: Attending: Hematology and Oncology | Admitting: Hematology and Oncology

## 2023-08-19 DIAGNOSIS — Z170 Estrogen receptor positive status [ER+]: Secondary | ICD-10-CM

## 2023-08-19 DIAGNOSIS — C50412 Malignant neoplasm of upper-outer quadrant of left female breast: Secondary | ICD-10-CM

## 2023-08-19 NOTE — Telephone Encounter (Signed)
 Received call from patient stating she is not feeling up to coming in for her appt with Dr. Gudena but would still like to do a telephone visit. I have changed the visit type to telephone and pt is aware.

## 2023-08-19 NOTE — Progress Notes (Signed)
 HEMATOLOGY-ONCOLOGY TELEPHONE VISIT PROGRESS NOTE  I connected with our patient on 08/19/23 at  1:15 PM EDT by telephone and verified that I am speaking with the correct person using two identifiers.  I discussed the limitations, risks, security and privacy concerns of performing an evaluation and management service by telephone and the availability of in person appointments.  I also discussed with the patient that there may be a patient responsible charge related to this service. The patient expressed understanding and agreed to proceed.   History of Present Illness: Follow-up after surgery to discuss pathology report  History of Present Illness She is a 53 year old female with breast cancer who presents for follow-up after recent hospitalization and surgery.  She underwent an incision and drainage procedure on her breast during her recent hospitalization. Her fever has improved since the procedure, but she is experiencing postoperative pain. Her oncotype test was performed, and she was informed of the results. All lymph nodes are clear.    Oncology History  Malignant neoplasm of upper-outer quadrant of left breast in female, estrogen receptor positive (HCC)  06/22/2023 Initial Diagnosis   Screening mammogram detected left breast asymmetry/mass measuring 1.1 cm, axilla negative, ultrasound biopsy: Grade 2 IDC ER 99%, PR 100%, Ki67 2%, HER2 negative   06/30/2023 Cancer Staging   Staging form: Breast, AJCC 8th Edition - Clinical stage from 06/30/2023: Stage IA (cT1c, cN0, cM0, G2, ER+, PR+, HER2-) - Signed by Odean Potts, MD on 06/30/2023 Stage prefix: Initial diagnosis Histologic grading system: 3 grade system Laterality: Left Staged by: Pathologist and managing physician Stage used in treatment planning: Yes National guidelines used in treatment planning: Yes Type of national guideline used in treatment planning: NCCN     REVIEW OF SYSTEMS:   Constitutional: Denies fevers, chills or  abnormal weight loss All other systems were reviewed with the patient and are negative. Observations/Objective:     Assessment Plan:  Malignant neoplasm of upper-outer quadrant of left breast in female, estrogen receptor positive (HCC) 06/22/2023:Screening mammogram detected left breast asymmetry/mass measuring 1.1 cm, axilla negative, ultrasound biopsy: Grade 2 IDC ER 99%, PR 100%, Ki67 2%, HER2 negative   Recommendations: 1. 07/20/23: Left Lumpectomy: Grade 2 IDC with DCIS 2.5 cm, ER 99%, PR 100%, Ki67 2%, HER2 negative 0/3 lymph nodes negative 2. Oncotype DX testing: 11 (ROR 3%) 3. Adjuvant radiation therapy followed by 4. Adjuvant antiestrogen therapy   Hospitalization: 08/15/2023-08/17/2023: Left breast abscess postlumpectomy   Return to clinic after radiation is complete  I discussed the assessment and treatment plan with the patient. The patient was provided an opportunity to ask questions and all were answered. The patient agreed with the plan and demonstrated an understanding of the instructions. The patient was advised to call back or seek an in-person evaluation if the symptoms worsen or if the condition fails to improve as anticipated.   I provided 20 minutes of non-face-to-face time during this encounter.  This includes time for charting and coordination of care   Naomi MARLA Odean, MD

## 2023-08-20 LAB — CULTURE, BLOOD (ROUTINE X 2)
Culture: NO GROWTH
Culture: NO GROWTH
Special Requests: ADEQUATE
Special Requests: ADEQUATE

## 2023-08-21 LAB — AEROBIC/ANAEROBIC CULTURE W GRAM STAIN (SURGICAL/DEEP WOUND)

## 2023-08-27 DIAGNOSIS — Z170 Estrogen receptor positive status [ER+]: Secondary | ICD-10-CM | POA: Diagnosis not present

## 2023-08-27 DIAGNOSIS — C50912 Malignant neoplasm of unspecified site of left female breast: Secondary | ICD-10-CM | POA: Diagnosis not present

## 2023-08-27 DIAGNOSIS — N951 Menopausal and female climacteric states: Secondary | ICD-10-CM | POA: Diagnosis not present

## 2023-08-27 DIAGNOSIS — T8140XD Infection following a procedure, unspecified, subsequent encounter: Secondary | ICD-10-CM | POA: Diagnosis not present

## 2023-08-27 DIAGNOSIS — N611 Abscess of the breast and nipple: Secondary | ICD-10-CM | POA: Diagnosis not present

## 2023-08-31 NOTE — Progress Notes (Signed)
 Location of Breast Cancer:  Malignant neoplasm of upper-outer quadrant of left breast in female, estrogen receptor positive (HCC)   Histology per Pathology Report:    Receptor Status: ER(positive), PR (positive), Her2-neu (neagative +1 ), Ki-67(2%)  Did patient present with symptoms (if so, please note symptoms) or was this found on screening mammography?: Screening mammography  Past/Anticipated interventions by surgeon, if jwb:ozqu    Past/Anticipated interventions by medical oncology, if any:  Dr. Odean   Lymphedema issues, if any:  no She reports some cording but no to lymphedema. Pain issues, if any:  no   SAFETY ISSUES: Prior radiation? no Pacemaker/ICD? no Possible current pregnancy?no Is the patient on methotrexate? no  Current Complaints / other details:      BP 109/79 (BP Location: Right Arm)   Pulse 85   Temp 98 F (36.7 C) (Oral)   Resp 18   Ht 5' 7 (1.702 m)   Wt 204 lb 3.2 oz (92.6 kg)   LMP 07/21/2023 (Exact Date)   SpO2 96%   BMI 31.98 kg/m

## 2023-08-31 NOTE — Progress Notes (Signed)
 Radiation Oncology         [phone] ________________________________  Name: Jenny Hudson MRN: [id number]  Date: 09/02/2023  DOB: 1970/01/12  Re-Evaluation Note  CC: Dwight Trula SHAUNNA, MD  Odean Potts, MD  No diagnosis found.  Diagnosis:   The encounter diagnosis was Malignant neoplasm of upper-outer quadrant of left breast in female, estrogen receptor positive (HCC).   Stage IA (cT1c, cN0, cM0) Left Breast UOQ, Invasive and in situ ductal carcinoma, ER+ / PR+ / Her2-, Grade 2  Narrative:  The patient returns today to discuss radiation treatment options. She was seen in the multidisciplinary breast clinic on 06/30/23.  In the interval since she was last seen, patient presented for a consult visit with Dr. Gudena on 06/30/23 to discuss further treatment options. Upon discussion, patient will proceed with a breast conserving surgery followed by Oncotype testing, radiation and antiestrogen therapy.   She opted to proceed with a left breast lumpectomy with radioactive seed localization, w sentinel lymph nodal biopsy on 07/20/23 under the care of Dr. Ebbie. Pathology from the procedure revealed: grade 2 Invasive moderately differentiated ductal adenocarcinoma and LCIS and solid and cribriform DCIS without necrosis present. Tumor measures 2.5 x 0.9 x 0.9 cm and is negative for angiolymphatic invasion; all margins free of carcinoma [invasive carcinoma 3 mm from posterior margin, 7 mm from lateral margin and 7 mm from medial margin; DCIS is 0.9 mm from posterior margin, 3 mm from medial margin, 5 mm from lateral margin and 6 mm from inferior margin. Prognostic indicators significant for: estrogen receptor 99% positive with strong staining; progesterone receptor 100% positive with strong staining; Proliferation marker Ki67 at 2%; Her2 status negative 1+; Grade 2. All examined lymph nodes are negative for any malignancy.      Oncotype DX was obtained on the final surgical sample and the  recurrence score of 11 predicts a risk of recurrence outside the breast over the next 9 years of 3%, if the patient's only systemic therapy is an antiestrogen for 5 years.  It also predicts no significant benefit from chemotherapy.  Patient presented for a follow up with Dr. Ebbie on 07/27/23 where he aspirated 35 cc of fluid from small developing seroma. She returned on 08/10/23 where her seroma had resolved and she was directed to stop wearing her compression bra.     On 08/14/23, she presented at the [facility] complaining of left breast pain and a fever. Due to her history she was deferred to the ED where she was found to have an abscess/cellulitis with acute swelling and redness. Dr. Vernetta had aspirated 30 cc of purulent material and prescribed antibiotics. Patient was then discharged. However, she returned to the ED on 8/10 with drainage from the incision accompanied by nausea and vomiting. She was admitted for inpatient care and underwent an abscess drainage and JP drain placement on 8/11 with Dr. Ebbie.         She presented for a follow up with Dr. ebbie on 08/27/23 where she reported recovering quite well. JP drain was also removed at that time. No indication of infection was noted at that time.    On review of systems, the patient reports ***. She denies *** and any other symptoms.    Allergies:  is allergic to sulfa antibiotics.  Meds: Current Outpatient Medications  Medication Sig Dispense Refill   acetaminophen  (TYLENOL ) 500 MG tablet Take 500-1,000 mg by mouth every 8 (eight) hours as needed.     AIRSUPRA 90-80  MCG/ACT AERO Inhale 2 puffs into the lungs every 2 (two) hours as needed (shortness of breath).     doxycycline  (VIBRA -TABS) 100 MG tablet Take 1 tablet (100 mg total) by mouth 2 (two) times daily. 20 tablet 0   doxycycline  (VIBRA -TABS) 100 MG tablet Take 1 tablet (100 mg total) by mouth 2 (two) times daily. 14 tablet 0   EPINEPHrine  0.3 mg/0.3 mL IJ SOAJ injection Inject  0.3 mg into the muscle as needed for anaphylaxis.     esomeprazole (NEXIUM) 40 MG capsule Take 40 mg by mouth daily at 12 noon.     fluticasone-salmeterol (ADVAIR  DISKUS) 250-50 MCG/ACT AEPB Inhale 1 puff into the lungs in the morning and at bedtime. 180 each 6   ibuprofen  (ADVIL ,MOTRIN ) 600 MG tablet TAKE 1 TABLET BY MOUTH FOUR TIMES A DAY FOR 5 DAYS THEN EVERY 6 HOURS IF NEEDED  0   levocetirizine (XYZAL) 2.5 MG/5ML solution Take 2.5 mg by mouth every evening.     montelukast  (SINGULAIR ) 10 MG tablet Take 1 tablet (10 mg total) by mouth at bedtime. 30 tablet 11   Multiple Vitamins-Minerals (MULTIVITAMIN WITH MINERALS) tablet Take 1 tablet by mouth daily.     oxyCODONE  (OXY IR/ROXICODONE ) 5 MG immediate release tablet Take 1 tablet (5 mg total) by mouth every 6 (six) hours as needed for moderate pain (pain score 4-6) or severe pain (pain score 7-10). 20 tablet 0   sertraline (ZOLOFT) 100 MG tablet Take 100 mg by mouth daily.     No current facility-administered medications for this encounter.    Physical Findings: The patient is in no acute distress. Patient is alert and oriented.  vitals were not taken for this visit.  No significant changes. Lungs are clear to auscultation bilaterally. Heart has regular rate and rhythm. No palpable cervical, supraclavicular, or axillary adenopathy. Abdomen soft, non-tender, normal bowel sounds. *** Breast: no palpable mass, nipple discharge or bleeding. *** Breast: ***  Lab Findings: Lab Results  Component Value Date   WBC 13.6 (H) 08/15/2023   HGB 10.9 (L) 08/15/2023   HCT 34.2 (L) 08/15/2023   MCV 85.7 08/15/2023   PLT 226 08/15/2023    Radiographic Findings: No results found.  Impression:  Stage IA (cT1c, cN0, cM0) Left Breast UOQ, Invasive and in situ ductal carcinoma, ER+ / PR+ / Her2-, Grade 2  ***  Plan:  Patient is scheduled for CT simulation {date/later today}. ***  -----------------------------------  Lynwood CHARM Nasuti, PhD,  MD   This document serves as a record of services personally performed by Lynwood Nasuti, MD. It was created on his behalf by Reymundo Cartwright, a trained medical scribe. The creation of this record is based on the scribe's personal observations and the provider's statements to them. This document has been checked and approved by the attending provider.

## 2023-09-02 ENCOUNTER — Ambulatory Visit
Admission: RE | Admit: 2023-09-02 | Discharge: 2023-09-02 | Disposition: A | Discharge status: Home / Self Care | Source: Ambulatory Visit | Attending: Radiation Oncology | Admitting: Radiation Oncology

## 2023-09-02 ENCOUNTER — Encounter: Payer: Self-pay | Admitting: Radiation Oncology

## 2023-09-02 VITALS — BP 109/79 | HR 85 | Temp 98.0°F | Resp 18 | Ht 67.0 in | Wt 204.2 lb

## 2023-09-02 DIAGNOSIS — Z9851 Tubal ligation status: Secondary | ICD-10-CM | POA: Diagnosis not present

## 2023-09-02 DIAGNOSIS — Z7951 Long term (current) use of inhaled steroids: Secondary | ICD-10-CM | POA: Insufficient documentation

## 2023-09-02 DIAGNOSIS — C50412 Malignant neoplasm of upper-outer quadrant of left female breast: Secondary | ICD-10-CM | POA: Insufficient documentation

## 2023-09-02 DIAGNOSIS — Z79899 Other long term (current) drug therapy: Secondary | ICD-10-CM | POA: Diagnosis not present

## 2023-09-02 DIAGNOSIS — Z1732 Human epidermal growth factor receptor 2 negative status: Secondary | ICD-10-CM | POA: Insufficient documentation

## 2023-09-02 DIAGNOSIS — Z1721 Progesterone receptor positive status: Secondary | ICD-10-CM | POA: Diagnosis not present

## 2023-09-02 DIAGNOSIS — Z170 Estrogen receptor positive status [ER+]: Secondary | ICD-10-CM | POA: Diagnosis not present

## 2023-09-09 ENCOUNTER — Encounter: Payer: Self-pay | Admitting: *Deleted

## 2023-09-09 ENCOUNTER — Ambulatory Visit
Admission: RE | Admit: 2023-09-09 | Discharge: 2023-09-09 | Disposition: A | Discharge status: Home / Self Care | Source: Ambulatory Visit | Attending: Radiation Oncology | Admitting: Radiation Oncology

## 2023-09-09 DIAGNOSIS — Z170 Estrogen receptor positive status [ER+]: Secondary | ICD-10-CM | POA: Insufficient documentation

## 2023-09-09 DIAGNOSIS — Z510 Encounter for antineoplastic radiation therapy: Secondary | ICD-10-CM | POA: Diagnosis not present

## 2023-09-09 DIAGNOSIS — C50412 Malignant neoplasm of upper-outer quadrant of left female breast: Secondary | ICD-10-CM | POA: Insufficient documentation

## 2023-09-13 ENCOUNTER — Other Ambulatory Visit: Payer: Self-pay

## 2023-09-13 DIAGNOSIS — Z510 Encounter for antineoplastic radiation therapy: Secondary | ICD-10-CM | POA: Diagnosis not present

## 2023-09-13 DIAGNOSIS — Z170 Estrogen receptor positive status [ER+]: Secondary | ICD-10-CM | POA: Diagnosis not present

## 2023-09-13 DIAGNOSIS — C50412 Malignant neoplasm of upper-outer quadrant of left female breast: Secondary | ICD-10-CM

## 2023-09-13 LAB — RAD ONC ARIA SESSION SUMMARY
Course Elapsed Days: 0
Plan Fractions Treated to Date: 1
Plan Prescribed Dose Per Fraction: 2.67 Gy
Plan Total Fractions Prescribed: 15
Plan Total Prescribed Dose: 40.05 Gy
Reference Point Dosage Given to Date: 2.67 Gy
Reference Point Session Dosage Given: 2.67 Gy
Session Number: 1

## 2023-09-14 ENCOUNTER — Other Ambulatory Visit: Payer: Self-pay

## 2023-09-14 ENCOUNTER — Ambulatory Visit
Admission: RE | Admit: 2023-09-14 | Discharge: 2023-09-14 | Disposition: A | Discharge status: Home / Self Care | Source: Ambulatory Visit | Attending: Radiation Oncology | Admitting: Radiation Oncology

## 2023-09-14 DIAGNOSIS — C50412 Malignant neoplasm of upper-outer quadrant of left female breast: Secondary | ICD-10-CM

## 2023-09-14 DIAGNOSIS — Z510 Encounter for antineoplastic radiation therapy: Secondary | ICD-10-CM | POA: Diagnosis not present

## 2023-09-14 DIAGNOSIS — Z170 Estrogen receptor positive status [ER+]: Secondary | ICD-10-CM | POA: Diagnosis not present

## 2023-09-14 LAB — RAD ONC ARIA SESSION SUMMARY
Course Elapsed Days: 1
Plan Fractions Treated to Date: 2
Plan Prescribed Dose Per Fraction: 2.67 Gy
Plan Total Fractions Prescribed: 15
Plan Total Prescribed Dose: 40.05 Gy
Reference Point Dosage Given to Date: 5.34 Gy
Reference Point Session Dosage Given: 2.67 Gy
Session Number: 2

## 2023-09-14 MED ORDER — ALRA NON-METALLIC DEODORANT (RAD-ONC)
1.0000 | Freq: Once | TOPICAL | Status: AC
  Administered 2023-09-14: 1 via TOPICAL

## 2023-09-14 MED ORDER — RADIAPLEXRX EX GEL: Freq: Once | CUTANEOUS | Status: AC

## 2023-09-15 ENCOUNTER — Ambulatory Visit
Admission: RE | Admit: 2023-09-15 | Discharge: 2023-09-15 | Disposition: A | Discharge status: Home / Self Care | Source: Ambulatory Visit | Attending: Radiation Oncology | Admitting: Radiation Oncology

## 2023-09-15 ENCOUNTER — Other Ambulatory Visit: Payer: Self-pay

## 2023-09-15 DIAGNOSIS — Z510 Encounter for antineoplastic radiation therapy: Secondary | ICD-10-CM | POA: Diagnosis not present

## 2023-09-15 DIAGNOSIS — Z170 Estrogen receptor positive status [ER+]: Secondary | ICD-10-CM | POA: Diagnosis not present

## 2023-09-15 DIAGNOSIS — C50412 Malignant neoplasm of upper-outer quadrant of left female breast: Secondary | ICD-10-CM | POA: Diagnosis not present

## 2023-09-15 LAB — RAD ONC ARIA SESSION SUMMARY
Course Elapsed Days: 2
Plan Fractions Treated to Date: 3
Plan Prescribed Dose Per Fraction: 2.67 Gy
Plan Total Fractions Prescribed: 15
Plan Total Prescribed Dose: 40.05 Gy
Reference Point Dosage Given to Date: 8.01 Gy
Reference Point Session Dosage Given: 2.67 Gy
Session Number: 3

## 2023-09-16 ENCOUNTER — Other Ambulatory Visit: Payer: Self-pay

## 2023-09-16 ENCOUNTER — Ambulatory Visit
Admission: RE | Admit: 2023-09-16 | Discharge: 2023-09-16 | Disposition: A | Discharge status: Home / Self Care | Source: Ambulatory Visit | Attending: Radiation Oncology | Admitting: Radiation Oncology

## 2023-09-16 DIAGNOSIS — Z510 Encounter for antineoplastic radiation therapy: Secondary | ICD-10-CM | POA: Diagnosis not present

## 2023-09-16 DIAGNOSIS — C50412 Malignant neoplasm of upper-outer quadrant of left female breast: Secondary | ICD-10-CM | POA: Diagnosis not present

## 2023-09-16 DIAGNOSIS — Z170 Estrogen receptor positive status [ER+]: Secondary | ICD-10-CM | POA: Diagnosis not present

## 2023-09-16 LAB — RAD ONC ARIA SESSION SUMMARY
Course Elapsed Days: 3
Plan Fractions Treated to Date: 4
Plan Prescribed Dose Per Fraction: 2.67 Gy
Plan Total Fractions Prescribed: 15
Plan Total Prescribed Dose: 40.05 Gy
Reference Point Dosage Given to Date: 10.68 Gy
Reference Point Session Dosage Given: 2.67 Gy
Session Number: 4

## 2023-09-17 ENCOUNTER — Other Ambulatory Visit: Payer: Self-pay

## 2023-09-17 ENCOUNTER — Ambulatory Visit
Admission: RE | Admit: 2023-09-17 | Discharge: 2023-09-17 | Disposition: A | Discharge status: Home / Self Care | Source: Ambulatory Visit | Attending: Radiation Oncology | Admitting: Radiation Oncology

## 2023-09-17 DIAGNOSIS — Z170 Estrogen receptor positive status [ER+]: Secondary | ICD-10-CM | POA: Diagnosis not present

## 2023-09-17 DIAGNOSIS — C50412 Malignant neoplasm of upper-outer quadrant of left female breast: Secondary | ICD-10-CM | POA: Diagnosis not present

## 2023-09-17 DIAGNOSIS — Z510 Encounter for antineoplastic radiation therapy: Secondary | ICD-10-CM | POA: Diagnosis not present

## 2023-09-17 LAB — RAD ONC ARIA SESSION SUMMARY
Course Elapsed Days: 4
Plan Fractions Treated to Date: 5
Plan Prescribed Dose Per Fraction: 2.67 Gy
Plan Total Fractions Prescribed: 15
Plan Total Prescribed Dose: 40.05 Gy
Reference Point Dosage Given to Date: 13.35 Gy
Reference Point Session Dosage Given: 2.67 Gy
Session Number: 5

## 2023-09-20 ENCOUNTER — Ambulatory Visit
Admission: RE | Admit: 2023-09-20 | Discharge: 2023-09-20 | Disposition: A | Discharge status: Home / Self Care | Source: Ambulatory Visit | Attending: Radiation Oncology | Admitting: Radiation Oncology

## 2023-09-20 ENCOUNTER — Other Ambulatory Visit: Payer: Self-pay

## 2023-09-20 DIAGNOSIS — Z170 Estrogen receptor positive status [ER+]: Secondary | ICD-10-CM | POA: Diagnosis not present

## 2023-09-20 DIAGNOSIS — C50412 Malignant neoplasm of upper-outer quadrant of left female breast: Secondary | ICD-10-CM | POA: Diagnosis not present

## 2023-09-20 DIAGNOSIS — Z510 Encounter for antineoplastic radiation therapy: Secondary | ICD-10-CM | POA: Diagnosis not present

## 2023-09-20 LAB — RAD ONC ARIA SESSION SUMMARY
Course Elapsed Days: 7
Plan Fractions Treated to Date: 6
Plan Prescribed Dose Per Fraction: 2.67 Gy
Plan Total Fractions Prescribed: 15
Plan Total Prescribed Dose: 40.05 Gy
Reference Point Dosage Given to Date: 16.02 Gy
Reference Point Session Dosage Given: 2.67 Gy
Session Number: 6

## 2023-09-21 ENCOUNTER — Ambulatory Visit
Admission: RE | Admit: 2023-09-21 | Discharge: 2023-09-21 | Disposition: A | Discharge status: Home / Self Care | Source: Ambulatory Visit | Attending: Radiation Oncology | Admitting: Radiation Oncology

## 2023-09-21 ENCOUNTER — Other Ambulatory Visit: Payer: Self-pay

## 2023-09-21 DIAGNOSIS — Z510 Encounter for antineoplastic radiation therapy: Secondary | ICD-10-CM | POA: Diagnosis not present

## 2023-09-21 DIAGNOSIS — C50412 Malignant neoplasm of upper-outer quadrant of left female breast: Secondary | ICD-10-CM | POA: Diagnosis not present

## 2023-09-21 DIAGNOSIS — Z170 Estrogen receptor positive status [ER+]: Secondary | ICD-10-CM | POA: Diagnosis not present

## 2023-09-21 LAB — RAD ONC ARIA SESSION SUMMARY
Course Elapsed Days: 8
Plan Fractions Treated to Date: 7
Plan Prescribed Dose Per Fraction: 2.67 Gy
Plan Total Fractions Prescribed: 15
Plan Total Prescribed Dose: 40.05 Gy
Reference Point Dosage Given to Date: 18.69 Gy
Reference Point Session Dosage Given: 2.67 Gy
Session Number: 7

## 2023-09-22 ENCOUNTER — Ambulatory Visit
Admission: RE | Admit: 2023-09-22 | Discharge: 2023-09-22 | Disposition: A | Discharge status: Home / Self Care | Source: Ambulatory Visit | Attending: Radiation Oncology | Admitting: Radiation Oncology

## 2023-09-22 ENCOUNTER — Other Ambulatory Visit: Payer: Self-pay

## 2023-09-22 DIAGNOSIS — C50412 Malignant neoplasm of upper-outer quadrant of left female breast: Secondary | ICD-10-CM | POA: Diagnosis not present

## 2023-09-22 DIAGNOSIS — Z510 Encounter for antineoplastic radiation therapy: Secondary | ICD-10-CM | POA: Diagnosis not present

## 2023-09-22 DIAGNOSIS — Z170 Estrogen receptor positive status [ER+]: Secondary | ICD-10-CM | POA: Diagnosis not present

## 2023-09-22 LAB — RAD ONC ARIA SESSION SUMMARY
Course Elapsed Days: 9
Plan Fractions Treated to Date: 8
Plan Prescribed Dose Per Fraction: 2.67 Gy
Plan Total Fractions Prescribed: 15
Plan Total Prescribed Dose: 40.05 Gy
Reference Point Dosage Given to Date: 21.36 Gy
Reference Point Session Dosage Given: 2.67 Gy
Session Number: 8

## 2023-09-23 ENCOUNTER — Other Ambulatory Visit: Payer: Self-pay

## 2023-09-23 ENCOUNTER — Ambulatory Visit
Admission: RE | Admit: 2023-09-23 | Discharge: 2023-09-23 | Disposition: A | Discharge status: Home / Self Care | Source: Ambulatory Visit | Attending: Radiation Oncology | Admitting: Radiation Oncology

## 2023-09-23 DIAGNOSIS — Z170 Estrogen receptor positive status [ER+]: Secondary | ICD-10-CM | POA: Diagnosis not present

## 2023-09-23 DIAGNOSIS — Z510 Encounter for antineoplastic radiation therapy: Secondary | ICD-10-CM | POA: Diagnosis not present

## 2023-09-23 DIAGNOSIS — C50412 Malignant neoplasm of upper-outer quadrant of left female breast: Secondary | ICD-10-CM | POA: Diagnosis not present

## 2023-09-23 LAB — RAD ONC ARIA SESSION SUMMARY
Course Elapsed Days: 10
Plan Fractions Treated to Date: 9
Plan Prescribed Dose Per Fraction: 2.67 Gy
Plan Total Fractions Prescribed: 15
Plan Total Prescribed Dose: 40.05 Gy
Reference Point Dosage Given to Date: 24.03 Gy
Reference Point Session Dosage Given: 2.67 Gy
Session Number: 9

## 2023-09-24 ENCOUNTER — Ambulatory Visit

## 2023-09-27 ENCOUNTER — Other Ambulatory Visit: Payer: Self-pay

## 2023-09-27 ENCOUNTER — Ambulatory Visit
Admission: RE | Admit: 2023-09-27 | Discharge: 2023-09-27 | Disposition: A | Discharge status: Home / Self Care | Source: Ambulatory Visit | Attending: Radiation Oncology | Admitting: Radiation Oncology

## 2023-09-27 DIAGNOSIS — Z170 Estrogen receptor positive status [ER+]: Secondary | ICD-10-CM | POA: Diagnosis not present

## 2023-09-27 DIAGNOSIS — C50412 Malignant neoplasm of upper-outer quadrant of left female breast: Secondary | ICD-10-CM | POA: Diagnosis not present

## 2023-09-27 DIAGNOSIS — Z510 Encounter for antineoplastic radiation therapy: Secondary | ICD-10-CM | POA: Diagnosis not present

## 2023-09-27 LAB — RAD ONC ARIA SESSION SUMMARY
Course Elapsed Days: 14
Plan Fractions Treated to Date: 10
Plan Prescribed Dose Per Fraction: 2.67 Gy
Plan Total Fractions Prescribed: 15
Plan Total Prescribed Dose: 40.05 Gy
Reference Point Dosage Given to Date: 26.7 Gy
Reference Point Session Dosage Given: 2.67 Gy
Session Number: 10

## 2023-09-28 ENCOUNTER — Ambulatory Visit: Admitting: Radiation Oncology

## 2023-09-28 ENCOUNTER — Ambulatory Visit

## 2023-09-29 ENCOUNTER — Other Ambulatory Visit: Payer: Self-pay

## 2023-09-29 ENCOUNTER — Ambulatory Visit
Admission: RE | Admit: 2023-09-29 | Discharge: 2023-09-29 | Disposition: A | Discharge status: Home / Self Care | Source: Ambulatory Visit | Attending: Radiation Oncology | Admitting: Radiation Oncology

## 2023-09-29 ENCOUNTER — Ambulatory Visit
Admission: RE | Admit: 2023-09-29 | Discharge: 2023-09-29 | Discharge status: Home / Self Care | Source: Ambulatory Visit | Attending: Radiation Oncology | Admitting: Radiation Oncology

## 2023-09-29 DIAGNOSIS — C50412 Malignant neoplasm of upper-outer quadrant of left female breast: Secondary | ICD-10-CM | POA: Diagnosis not present

## 2023-09-29 DIAGNOSIS — Z510 Encounter for antineoplastic radiation therapy: Secondary | ICD-10-CM | POA: Diagnosis not present

## 2023-09-29 DIAGNOSIS — Z170 Estrogen receptor positive status [ER+]: Secondary | ICD-10-CM | POA: Diagnosis not present

## 2023-09-29 LAB — RAD ONC ARIA SESSION SUMMARY
Course Elapsed Days: 16
Plan Fractions Treated to Date: 11
Plan Prescribed Dose Per Fraction: 2.67 Gy
Plan Total Fractions Prescribed: 15
Plan Total Prescribed Dose: 40.05 Gy
Reference Point Dosage Given to Date: 29.37 Gy
Reference Point Session Dosage Given: 2.67 Gy
Session Number: 11

## 2023-09-30 ENCOUNTER — Ambulatory Visit
Admission: RE | Admit: 2023-09-30 | Discharge: 2023-09-30 | Disposition: A | Discharge status: Home / Self Care | Source: Ambulatory Visit | Attending: Radiation Oncology | Admitting: Radiation Oncology

## 2023-09-30 ENCOUNTER — Other Ambulatory Visit: Payer: Self-pay

## 2023-09-30 DIAGNOSIS — C50412 Malignant neoplasm of upper-outer quadrant of left female breast: Secondary | ICD-10-CM | POA: Diagnosis not present

## 2023-09-30 DIAGNOSIS — Z510 Encounter for antineoplastic radiation therapy: Secondary | ICD-10-CM | POA: Diagnosis not present

## 2023-09-30 DIAGNOSIS — Z170 Estrogen receptor positive status [ER+]: Secondary | ICD-10-CM | POA: Diagnosis not present

## 2023-09-30 LAB — RAD ONC ARIA SESSION SUMMARY
Course Elapsed Days: 17
Plan Fractions Treated to Date: 12
Plan Prescribed Dose Per Fraction: 2.67 Gy
Plan Total Fractions Prescribed: 15
Plan Total Prescribed Dose: 40.05 Gy
Reference Point Dosage Given to Date: 32.04 Gy
Reference Point Session Dosage Given: 2.67 Gy
Session Number: 12

## 2023-10-01 ENCOUNTER — Ambulatory Visit
Admission: RE | Admit: 2023-10-01 | Discharge: 2023-10-01 | Disposition: A | Discharge status: Home / Self Care | Source: Ambulatory Visit | Attending: Radiation Oncology | Admitting: Radiation Oncology

## 2023-10-01 ENCOUNTER — Other Ambulatory Visit: Payer: Self-pay

## 2023-10-01 DIAGNOSIS — C50412 Malignant neoplasm of upper-outer quadrant of left female breast: Secondary | ICD-10-CM | POA: Diagnosis not present

## 2023-10-01 DIAGNOSIS — Z510 Encounter for antineoplastic radiation therapy: Secondary | ICD-10-CM | POA: Diagnosis not present

## 2023-10-01 DIAGNOSIS — Z170 Estrogen receptor positive status [ER+]: Secondary | ICD-10-CM | POA: Diagnosis not present

## 2023-10-01 LAB — RAD ONC ARIA SESSION SUMMARY
Course Elapsed Days: 18
Plan Fractions Treated to Date: 13
Plan Prescribed Dose Per Fraction: 2.67 Gy
Plan Total Fractions Prescribed: 15
Plan Total Prescribed Dose: 40.05 Gy
Reference Point Dosage Given to Date: 34.71 Gy
Reference Point Session Dosage Given: 2.67 Gy
Session Number: 13

## 2023-10-04 ENCOUNTER — Other Ambulatory Visit: Payer: Self-pay

## 2023-10-04 ENCOUNTER — Ambulatory Visit
Admission: RE | Admit: 2023-10-04 | Discharge: 2023-10-04 | Disposition: A | Source: Ambulatory Visit | Attending: Radiation Oncology | Admitting: Radiation Oncology

## 2023-10-04 ENCOUNTER — Inpatient Hospital Stay (HOSPITAL_BASED_OUTPATIENT_CLINIC_OR_DEPARTMENT_OTHER): Admitting: Hematology and Oncology

## 2023-10-04 VITALS — BP 110/62 | HR 96 | Temp 98.2°F | Resp 17 | Ht 67.0 in | Wt 209.8 lb

## 2023-10-04 DIAGNOSIS — Z510 Encounter for antineoplastic radiation therapy: Secondary | ICD-10-CM | POA: Diagnosis not present

## 2023-10-04 DIAGNOSIS — Z1732 Human epidermal growth factor receptor 2 negative status: Secondary | ICD-10-CM | POA: Insufficient documentation

## 2023-10-04 DIAGNOSIS — Z170 Estrogen receptor positive status [ER+]: Secondary | ICD-10-CM | POA: Insufficient documentation

## 2023-10-04 DIAGNOSIS — C50412 Malignant neoplasm of upper-outer quadrant of left female breast: Secondary | ICD-10-CM | POA: Insufficient documentation

## 2023-10-04 DIAGNOSIS — Z1721 Progesterone receptor positive status: Secondary | ICD-10-CM | POA: Insufficient documentation

## 2023-10-04 DIAGNOSIS — Z923 Personal history of irradiation: Secondary | ICD-10-CM | POA: Insufficient documentation

## 2023-10-04 LAB — RAD ONC ARIA SESSION SUMMARY
Course Elapsed Days: 21
Plan Fractions Treated to Date: 14
Plan Prescribed Dose Per Fraction: 2.67 Gy
Plan Total Fractions Prescribed: 15
Plan Total Prescribed Dose: 40.05 Gy
Reference Point Dosage Given to Date: 37.38 Gy
Reference Point Session Dosage Given: 2.67 Gy
Session Number: 14

## 2023-10-04 MED ORDER — TAMOXIFEN CITRATE 20 MG PO TABS: 20.0000 mg | ORAL_TABLET | Freq: Every day | ORAL | 3 refills | Status: AC

## 2023-10-04 NOTE — Assessment & Plan Note (Signed)
 06/22/2023:Screening mammogram detected left breast asymmetry/mass measuring 1.1 cm, axilla negative, ultrasound biopsy: Grade 2 IDC ER 99%, PR 100%, Ki67 2%, HER2 negative   Recommendations: 1. 07/20/23: Left Lumpectomy: Grade 2 IDC with DCIS 2.5 cm, ER 99%, PR 100%, Ki67 2%, HER2 negative 0/3 lymph nodes negative 2. Oncotype DX testing: 11 (ROR 3%) 3. Adjuvant radiation 09/15/2023-10/16/2023 4. Adjuvant antiestrogen therapy with anastrozole 1 mg p.o. daily x 7 years to start 11/06/2023   Hospitalization: 08/15/2023-08/17/2023: Left breast abscess postlumpectomy    Return to clinic in 3 months for survivorship clinic

## 2023-10-04 NOTE — Progress Notes (Signed)
 Patient Care Team: Dwight Trula SQUIBB, MD as PCP - General (Internal Medicine) Baxter Drivers, MD as Referring Physician (Emergency Medicine) Tyree Nanetta SAILOR, RN as Oncology Nurse Navigator Ebbie Cough, MD as Consulting Physician (General Surgery) Odean Potts, MD as Consulting Physician (Hematology and Oncology) Shannon Agent, MD as Consulting Physician (Radiation Oncology)  DIAGNOSIS:  Encounter Diagnosis  Name Primary?   Malignant neoplasm of upper-outer quadrant of left breast in female, estrogen receptor positive (HCC) Yes    SUMMARY OF ONCOLOGIC HISTORY: Oncology History  Malignant neoplasm of upper-outer quadrant of left breast in female, estrogen receptor positive (HCC)  06/22/2023 Initial Diagnosis   Screening mammogram detected left breast asymmetry/mass measuring 1.1 cm, axilla negative, ultrasound biopsy: Grade 2 IDC ER 99%, PR 100%, Ki67 2%, HER2 negative   06/30/2023 Cancer Staging   Staging form: Breast, AJCC 8th Edition - Clinical stage from 06/30/2023: Stage IA (cT1c, cN0, cM0, G2, ER+, PR+, HER2-) - Signed by Odean Potts, MD on 06/30/2023 Stage prefix: Initial diagnosis Histologic grading system: 3 grade system Laterality: Left Staged by: Pathologist and managing physician Stage used in treatment planning: Yes National guidelines used in treatment planning: Yes Type of national guideline used in treatment planning: NCCN     CHIEF COMPLIANT: Follow-up after radiation  HISTORY OF PRESENT ILLNESS:  History of Present Illness Jenny Hudson is a 53 year old female with breast cancer undergoing radiation therapy who presents for follow-up.  She is undergoing radiation therapy for breast cancer, which has been delayed due to illness and machine issues, now expected to complete on October 7th. She is experiencing significant fatigue, which she describes as a new level beyond her usual tiredness. Her menstrual cycles are irregular, and she is  perimenopausal.     ALLERGIES:  is allergic to sulfa antibiotics.  MEDICATIONS:  Current Outpatient Medications  Medication Sig Dispense Refill   acetaminophen  (TYLENOL ) 500 MG tablet Take 500-1,000 mg by mouth every 8 (eight) hours as needed.     AIRSUPRA 90-80 MCG/ACT AERO Inhale 2 puffs into the lungs every 2 (two) hours as needed (shortness of breath).     doxycycline  (VIBRA -TABS) 100 MG tablet Take 1 tablet (100 mg total) by mouth 2 (two) times daily. 20 tablet 0   doxycycline  (VIBRA -TABS) 100 MG tablet Take 1 tablet (100 mg total) by mouth 2 (two) times daily. 14 tablet 0   EPINEPHrine  0.3 mg/0.3 mL IJ SOAJ injection Inject 0.3 mg into the muscle as needed for anaphylaxis.     esomeprazole (NEXIUM) 40 MG capsule Take 40 mg by mouth daily at 12 noon.     fluticasone-salmeterol (ADVAIR  DISKUS) 250-50 MCG/ACT AEPB Inhale 1 puff into the lungs in the morning and at bedtime. 180 each 6   ibuprofen  (ADVIL ,MOTRIN ) 600 MG tablet TAKE 1 TABLET BY MOUTH FOUR TIMES A DAY FOR 5 DAYS THEN EVERY 6 HOURS IF NEEDED  0   levocetirizine (XYZAL) 2.5 MG/5ML solution Take 2.5 mg by mouth every evening.     montelukast  (SINGULAIR ) 10 MG tablet Take 1 tablet (10 mg total) by mouth at bedtime. 30 tablet 11   Multiple Vitamins-Minerals (MULTIVITAMIN WITH MINERALS) tablet Take 1 tablet by mouth daily.     oxyCODONE  (OXY IR/ROXICODONE ) 5 MG immediate release tablet Take 1 tablet (5 mg total) by mouth every 6 (six) hours as needed for moderate pain (pain score 4-6) or severe pain (pain score 7-10). 20 tablet 0   sertraline (ZOLOFT) 100 MG tablet Take 100 mg by mouth  daily.     No current facility-administered medications for this visit.    PHYSICAL EXAMINATION: ECOG PERFORMANCE STATUS: 1 - Symptomatic but completely ambulatory  Vitals:   10/04/23 1505  BP: 110/62  Pulse: 96  Resp: 17  Temp: 98.2 F (36.8 C)  SpO2: 98%   Filed Weights   10/04/23 1505  Weight: 209 lb 12.8 oz (95.2 kg)     LABORATORY DATA:  I have reviewed the data as listed    Latest Ref Rng & Units 08/15/2023    6:05 PM 08/14/2023    1:12 PM 06/30/2023   12:20 PM  CMP  Glucose 70 - 99 mg/dL 891  93  851   BUN 6 - 20 mg/dL 14  19  14    Creatinine 0.44 - 1.00 mg/dL 9.22  9.19  9.24   Sodium 135 - 145 mmol/L 134  134  137   Potassium 3.5 - 5.1 mmol/L 3.8  3.6  3.9   Chloride 98 - 111 mmol/L 105  100  104   CO2 22 - 32 mmol/L 21  17  27    Calcium  8.9 - 10.3 mg/dL 8.5  8.4  9.0   Total Protein 6.5 - 8.1 g/dL  7.4  7.5   Total Bilirubin 0.0 - 1.2 mg/dL  1.0  0.3   Alkaline Phos 38 - 126 U/L  57  50   AST 15 - 41 U/L  20  15   ALT 0 - 44 U/L  19  14     Lab Results  Component Value Date   WBC 13.6 (H) 08/15/2023   HGB 10.9 (L) 08/15/2023   HCT 34.2 (L) 08/15/2023   MCV 85.7 08/15/2023   PLT 226 08/15/2023   NEUTROABS 14.3 (H) 08/14/2023    ASSESSMENT & PLAN:  Malignant neoplasm of upper-outer quadrant of left breast in female, estrogen receptor positive (HCC) 06/22/2023:Screening mammogram detected left breast asymmetry/mass measuring 1.1 cm, axilla negative, ultrasound biopsy: Grade 2 IDC ER 99%, PR 100%, Ki67 2%, HER2 negative   Recommendations: 1. 07/20/23: Left Lumpectomy: Grade 2 IDC with DCIS 2.5 cm, ER 99%, PR 100%, Ki67 2%, HER2 negative 0/3 lymph nodes negative 2. Oncotype DX testing: 11 (ROR 3%) 3. Adjuvant radiation 09/15/2023-10/16/2023 4. Adjuvant antiestrogen therapy with tamoxifen 20 mg. daily x 1 years to start 11/06/2023 (this could be changed to anastrozole once she is in menopause)   Hospitalization: 08/15/2023-08/17/2023: Left breast abscess postlumpectomy    Tamoxifen counseling: We discussed the risks and benefits of tamoxifen. These include but not limited to insomnia, hot flashes, mood changes, vaginal dryness, and weight gain. Although rare, serious side effects including endometrial cancer, risk of blood clots were also discussed. We strongly believe that the benefits far  outweigh the risks. Patient understands these risks and consented to starting treatment.    Return to clinic in 3 months for survivorship clinic    No orders of the defined types were placed in this encounter.  The patient has a good understanding of the overall plan. she agrees with it. she will call with any problems that may develop before the next visit here. Total time spent: 30 mins including face to face time and time spent for planning, charting and co-ordination of care   Naomi MARLA Chad, MD 10/04/23

## 2023-10-05 ENCOUNTER — Ambulatory Visit: Admission: RE | Admit: 2023-10-05 | Source: Ambulatory Visit

## 2023-10-05 ENCOUNTER — Ambulatory Visit

## 2023-10-06 ENCOUNTER — Ambulatory Visit

## 2023-10-06 ENCOUNTER — Other Ambulatory Visit: Payer: Self-pay

## 2023-10-06 DIAGNOSIS — C50412 Malignant neoplasm of upper-outer quadrant of left female breast: Secondary | ICD-10-CM | POA: Diagnosis not present

## 2023-10-06 DIAGNOSIS — Z510 Encounter for antineoplastic radiation therapy: Secondary | ICD-10-CM | POA: Diagnosis not present

## 2023-10-06 DIAGNOSIS — Z170 Estrogen receptor positive status [ER+]: Secondary | ICD-10-CM | POA: Insufficient documentation

## 2023-10-06 LAB — RAD ONC ARIA SESSION SUMMARY
Course Elapsed Days: 23
Plan Fractions Treated to Date: 15
Plan Prescribed Dose Per Fraction: 2.67 Gy
Plan Total Fractions Prescribed: 15
Plan Total Prescribed Dose: 40.05 Gy
Reference Point Dosage Given to Date: 40.05 Gy
Reference Point Session Dosage Given: 2.67 Gy
Session Number: 15

## 2023-10-07 ENCOUNTER — Other Ambulatory Visit: Payer: Self-pay

## 2023-10-07 ENCOUNTER — Ambulatory Visit

## 2023-10-07 DIAGNOSIS — Z510 Encounter for antineoplastic radiation therapy: Secondary | ICD-10-CM | POA: Diagnosis not present

## 2023-10-07 DIAGNOSIS — Z170 Estrogen receptor positive status [ER+]: Secondary | ICD-10-CM | POA: Diagnosis not present

## 2023-10-07 DIAGNOSIS — C50412 Malignant neoplasm of upper-outer quadrant of left female breast: Secondary | ICD-10-CM | POA: Diagnosis not present

## 2023-10-07 LAB — RAD ONC ARIA SESSION SUMMARY
Course Elapsed Days: 24
Plan Fractions Treated to Date: 1
Plan Prescribed Dose Per Fraction: 2 Gy
Plan Total Fractions Prescribed: 5
Plan Total Prescribed Dose: 10 Gy
Reference Point Dosage Given to Date: 2 Gy
Reference Point Session Dosage Given: 2 Gy
Session Number: 16

## 2023-10-08 ENCOUNTER — Ambulatory Visit
Admission: RE | Admit: 2023-10-08 | Discharge: 2023-10-08 | Disposition: A | Discharge status: Home / Self Care | Source: Ambulatory Visit | Attending: Radiation Oncology | Admitting: Radiation Oncology

## 2023-10-08 ENCOUNTER — Ambulatory Visit

## 2023-10-08 ENCOUNTER — Other Ambulatory Visit: Payer: Self-pay

## 2023-10-08 DIAGNOSIS — C50412 Malignant neoplasm of upper-outer quadrant of left female breast: Secondary | ICD-10-CM | POA: Diagnosis not present

## 2023-10-08 LAB — RAD ONC ARIA SESSION SUMMARY
Course Elapsed Days: 25
Plan Fractions Treated to Date: 2
Plan Prescribed Dose Per Fraction: 2 Gy
Plan Total Fractions Prescribed: 5
Plan Total Prescribed Dose: 10 Gy
Reference Point Dosage Given to Date: 4 Gy
Reference Point Session Dosage Given: 2 Gy
Session Number: 17

## 2023-10-11 ENCOUNTER — Ambulatory Visit

## 2023-10-11 ENCOUNTER — Other Ambulatory Visit: Payer: Self-pay

## 2023-10-11 ENCOUNTER — Ambulatory Visit
Admission: RE | Admit: 2023-10-11 | Discharge: 2023-10-11 | Disposition: A | Discharge status: Home / Self Care | Source: Ambulatory Visit | Attending: Radiation Oncology | Admitting: Radiation Oncology

## 2023-10-11 DIAGNOSIS — C50412 Malignant neoplasm of upper-outer quadrant of left female breast: Secondary | ICD-10-CM | POA: Diagnosis not present

## 2023-10-11 DIAGNOSIS — Z170 Estrogen receptor positive status [ER+]: Secondary | ICD-10-CM | POA: Diagnosis not present

## 2023-10-11 LAB — RAD ONC ARIA SESSION SUMMARY
Course Elapsed Days: 28
Plan Fractions Treated to Date: 3
Plan Prescribed Dose Per Fraction: 2 Gy
Plan Total Fractions Prescribed: 5
Plan Total Prescribed Dose: 10 Gy
Reference Point Dosage Given to Date: 6 Gy
Reference Point Session Dosage Given: 2 Gy
Session Number: 18

## 2023-10-12 ENCOUNTER — Ambulatory Visit
Admission: RE | Admit: 2023-10-12 | Discharge: 2023-10-12 | Disposition: A | Source: Ambulatory Visit | Attending: Radiation Oncology | Admitting: Radiation Oncology

## 2023-10-12 ENCOUNTER — Other Ambulatory Visit: Payer: Self-pay

## 2023-10-12 ENCOUNTER — Ambulatory Visit

## 2023-10-12 ENCOUNTER — Ambulatory Visit
Admission: RE | Admit: 2023-10-12 | Discharge: 2023-10-12 | Disposition: A | Discharge status: Home / Self Care | Source: Ambulatory Visit | Attending: Radiation Oncology | Admitting: Radiation Oncology

## 2023-10-12 DIAGNOSIS — C50412 Malignant neoplasm of upper-outer quadrant of left female breast: Secondary | ICD-10-CM | POA: Diagnosis not present

## 2023-10-12 LAB — RAD ONC ARIA SESSION SUMMARY
Course Elapsed Days: 29
Plan Fractions Treated to Date: 4
Plan Prescribed Dose Per Fraction: 2 Gy
Plan Total Fractions Prescribed: 5
Plan Total Prescribed Dose: 10 Gy
Reference Point Dosage Given to Date: 8 Gy
Reference Point Session Dosage Given: 2 Gy
Session Number: 19

## 2023-10-13 ENCOUNTER — Other Ambulatory Visit: Payer: Self-pay

## 2023-10-13 ENCOUNTER — Ambulatory Visit
Admission: RE | Admit: 2023-10-13 | Discharge: 2023-10-13 | Disposition: A | Discharge status: Home / Self Care | Source: Ambulatory Visit | Attending: Radiation Oncology | Admitting: Radiation Oncology

## 2023-10-13 DIAGNOSIS — C50412 Malignant neoplasm of upper-outer quadrant of left female breast: Secondary | ICD-10-CM | POA: Diagnosis not present

## 2023-10-13 DIAGNOSIS — Z170 Estrogen receptor positive status [ER+]: Secondary | ICD-10-CM | POA: Diagnosis not present

## 2023-10-13 DIAGNOSIS — Z510 Encounter for antineoplastic radiation therapy: Secondary | ICD-10-CM | POA: Diagnosis not present

## 2023-10-13 LAB — RAD ONC ARIA SESSION SUMMARY
Course Elapsed Days: 30
Plan Fractions Treated to Date: 5
Plan Prescribed Dose Per Fraction: 2 Gy
Plan Total Fractions Prescribed: 5
Plan Total Prescribed Dose: 10 Gy
Reference Point Dosage Given to Date: 10 Gy
Reference Point Session Dosage Given: 2 Gy
Session Number: 20

## 2023-10-15 NOTE — Radiation Completion Notes (Addendum)
  Radiation Oncology         [phone] ________________________________  Name: Jenny Hudson MRN: [id number]  Date of Service: 10/13/2023  DOB: 1970-09-14  End of Treatment Note  Diagnosis: Stage IA (cT1c, cN0, cM0) Left Breast UOQ, Invasive and in situ ductal carcinoma, ER+ / PR+ / Her2-, Grade 2  Intent: Curative     ==========DELIVERED PLANS==========  First Treatment Date: 2023-09-13 Last Treatment Date: 2023-10-13   Plan Name: Breast_L_BH Site: Breast, Left Technique: 3D Mode: Photon Dose Per Fraction: 2.67 Gy Prescribed Dose (Delivered / Prescribed): 40.05 Gy / 40.05 Gy Prescribed Fxs (Delivered / Prescribed): 15 / 15   Plan Name: Brst_L_Bst_BH Site: Breast, Left Technique: 3D Mode: Photon Dose Per Fraction: 2 Gy Prescribed Dose (Delivered / Prescribed): 10 Gy / 10 Gy Prescribed Fxs (Delivered / Prescribed): 5 / 5     ====================================   The patient tolerated radiation. She developed fatigue and anticipated skin changes in the treatment field. Dr. Shannon recommended triple antibiotic ointment along the axillary and inframammary fold area as a precaution.   The patient will return in one month and will continue follow up with Dr. Gudena as well.      Jenny Due, PA-C

## 2023-11-04 DIAGNOSIS — J321 Chronic frontal sinusitis: Secondary | ICD-10-CM | POA: Diagnosis not present

## 2023-11-04 DIAGNOSIS — C50912 Malignant neoplasm of unspecified site of left female breast: Secondary | ICD-10-CM | POA: Diagnosis not present

## 2023-11-04 DIAGNOSIS — N951 Menopausal and female climacteric states: Secondary | ICD-10-CM | POA: Diagnosis not present

## 2023-11-04 DIAGNOSIS — F419 Anxiety disorder, unspecified: Secondary | ICD-10-CM | POA: Diagnosis not present

## 2023-11-08 ENCOUNTER — Ambulatory Visit: Payer: Self-pay | Attending: General Surgery

## 2023-11-08 VITALS — Wt 210.0 lb

## 2023-11-08 DIAGNOSIS — Z483 Aftercare following surgery for neoplasm: Secondary | ICD-10-CM | POA: Insufficient documentation

## 2023-11-08 NOTE — Therapy (Signed)
 OUTPATIENT PHYSICAL THERAPY SOZO SCREENING NOTE   Patient Name: Jenny Hudson MRN: [id number] DOB:May 19, 1970, 53 y.o., female Today's Date: 11/08/2023  PCP: Dwight Trula SHAUNNA, MD REFERRING PROVIDER: Ebbie Cough, MD   PT End of Session - 11/08/23 1003     Visit Number 2   # unchanged due to screen only   PT Start Time 1001    PT Stop Time 1005    PT Time Calculation (min) 4 min    Activity Tolerance Patient tolerated treatment well    Behavior During Therapy [facility] for tasks assessed/performed          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 animals   Epilepsy (HCC)    as child; has been on phenobarbital and tegretal in the past, at around 53 yo, had repeat EEG which was normal, and was tapered off seizure medications   Erosive gastritis 03/15/2012   Family history of breast cancer    Family history of melanoma    Family history of stomach cancer    Pulse irregularity    Seasonal allergies    Past Surgical History:  Procedure Laterality Date   AXILLARY SENTINEL NODE BIOPSY Left 07/20/2023   Procedure: BIOPSY, LYMPH NODE, SENTINEL, AXILLARY;  Surgeon: Ebbie Cough, MD;  Location: [facility];  Service: General;  Laterality: Left;  LMA w/PEC BLOCK LEFT BREAST SEED GUIDED LUMPECTOMY LEFT AXILLARY SENTINEL NODE BIOPSY   BREAST BIOPSY     BREAST BIOPSY Left 06/22/2023   US  LT BREAST BX W LOC DEV 1ST LESION IMG BX SPEC US  GUIDE 06/22/2023 [facility] MAMMOGRAPHY   BREAST BIOPSY Left 07/16/2023   US  LT RADIOACTIVE SEED LOC 07/16/2023 [facility] MAMMOGRAPHY   BREAST LUMPECTOMY WITH RADIOACTIVE SEED LOCALIZATION Left 07/20/2023   Procedure: BREAST LUMPECTOMY WITH RADIOACTIVE SEED LOCALIZATION;  Surgeon: Ebbie Cough, MD;  Location: [facility];  Service: General;  Laterality: Left;   DENTAL SURGERY  05/23/2017   DILATION AND CURETTAGE OF UTERUS  2008   ESOPHAGOGASTRODUODENOSCOPY N/A  03/15/2012   Procedure: ESOPHAGOGASTRODUODENOSCOPY (EGD);  Surgeon: Lupita FORBES Commander, MD;  Location: [facility];  Service: Endoscopy;  Laterality: N/A;   HAND SURGERY  2001   repair of tendons, nerves after glass laceerated her hand.    HERNIA REPAIR  age 16   right inguinal.    INCISION AND DRAINAGE ABSCESS Left 08/16/2023   Procedure: INCISION AND DRAINAGE, ABSCESS;  Surgeon: Ebbie Cough, MD;  Location: [facility];  Service: General;  Laterality: Left;   TUBAL LIGATION  2000   Patient Active Problem List   Diagnosis Date Noted   Abscess of left breast 08/15/2023   Allergic rhinitis due to animal hair and dander 08/15/2023   Allergic rhinitis due to pollen 08/15/2023   Chronic allergic conjunctivitis 08/15/2023   Chronic low back pain 08/15/2023   Lumbar spondylosis 08/15/2023   Mild intermittent asthma 08/15/2023   Malignant neoplasm of upper-outer quadrant of left breast in female, estrogen receptor positive (HCC) 06/29/2023   Genetic testing 08/14/2014   Family history of breast cancer    Family history of stomach cancer    Family history of melanoma    Asymptomatic bacteriuria 11/16/2012   Intractable vomiting 11/16/2012   Erosive gastritis 03/15/2012   Abdominal pain, acute, epigastric 03/14/2012   Nausea and vomiting 03/14/2012   Dehydration 03/14/2012   BV (bacterial vaginosis) 03/14/2012   GERD (gastroesophageal reflux disease) 03/14/2012  PVC (premature ventricular contraction) 07/23/2010    REFERRING DIAG: left breast cancer at risk for lymphedema  THERAPY DIAG: Aftercare following surgery for neoplasm  PERTINENT HISTORY: Patient was diagnosed on 06/22/23 with left grade 2. It measures 7 mm and is located in the upper-outer quadrant. It is ER,PR+, HER2- with a Ki67 of 2%. Underwent L breast lumpectomy and SLNB 0/4 on 07/20/23  PRECAUTIONS: left UE Lymphedema risk, None  SUBJECTIVE: Pt returns for her first 3 month L-Dex screen.   PAIN:  Are you having pain?  No  SOZO SCREENING: Patient was assessed today using the SOZO machine to determine the lymphedema index score. This was compared to her baseline score. It was determined that she is within the recommended range when compared to her baseline and no further action is needed at this time. She will continue SOZO screenings. These are done every 3 months for 2 years post operatively followed by every 6 months for 2 years, and then annually.   L-DEX FLOWSHEETS - 11/08/23 1000       L-DEX LYMPHEDEMA SCREENING   Measurement Type Unilateral    L-DEX MEASUREMENT EXTREMITY Upper Extremity    POSITION  Standing    DOMINANT SIDE Right    At Risk Side Left    BASELINE SCORE (UNILATERAL) -1.1    L-DEX SCORE (UNILATERAL) -2.7    VALUE CHANGE (UNILAT) -1.6            Aden Berwyn Caldron, PTA 11/08/2023, 10:05 AM

## 2023-11-12 ENCOUNTER — Encounter: Payer: Self-pay | Admitting: Radiation Oncology

## 2023-11-15 ENCOUNTER — Ambulatory Visit: Admitting: Radiation Oncology

## 2023-11-15 ENCOUNTER — Ambulatory Visit
Admission: RE | Admit: 2023-11-15 | Discharge: 2023-11-15 | Disposition: A | Discharge status: Home / Self Care | Source: Ambulatory Visit | Attending: Radiation Oncology | Admitting: Radiation Oncology

## 2023-11-15 NOTE — Progress Notes (Incomplete)
 Radiation Oncology         [phone] ________________________________  Name: Jenny Hudson MRN: [id number]  Date: 11/15/2023  DOB: 01-12-1970  Follow-Up Visit Note  CC: Dwight Trula SHAUNNA, MD  Dwight Trula SHAUNNA, MD  No diagnosis found.  Diagnosis:   The encounter diagnosis was Malignant neoplasm of upper-outer quadrant of left breast in female, estrogen receptor positive (HCC).   Stage IA (cT1c, cN0, cM0) Left Breast UOQ, Invasive and in situ ductal carcinoma, ER+ / PR+ / Her2-, Grade 2  Interval Since Last Radiation: 1 month and 2 days  Intent: Curative  Radiation Treatment Dates: First Treatment Date: 2023-09-13 -- Last Treatment Date: 2023-10-13 Site/Dose/Technique/Mode:  Plan Name: Breast_L_BH Site: Breast, Left Technique: 3D Mode: Photon Dose Per Fraction: 2.67 Gy Prescribed Dose (Delivered / Prescribed): 40.05 Gy / 40.05 Gy Prescribed Fxs (Delivered / Prescribed): 15 / 15   Plan Name: Brst_L_Bst_BH Site: Breast, Left Technique: 3D Mode: Photon Dose Per Fraction: 2 Gy Prescribed Dose (Delivered / Prescribed): 10 Gy / 10 Gy Prescribed Fxs (Delivered / Prescribed): 5 / 5  Narrative:  The patient returns today for routine follow-up. She tolerated radiation therapy relatively well overall other than fatigue and anticipated skin changes in the treatment field. She was given triple antibiotic ointment for this as a precaution and instructed to apply it along the axillary and inframammary fold areas.  While undergoing radiation therapy, she followed up with Dr. Gudena on 10/04/23. She did endorse significant fatigue at that time, and also reported having irregular menstrual cycles (perimenopausal status).  With regards to antiestrogen therapy, she agreed with try tamoxifen which she began taking on 11/01. Dr. Odean would like to change this to anastrozole once she reaches menopause.     No other significant oncologic interval history since the patient completed radiation  therapy. She will continue to present to OP rehab for SOZO screenings to assess her lymphedema risk.   ***                          Allergies:  is allergic to sulfa antibiotics.  Meds: Current Outpatient Medications  Medication Sig Dispense Refill   acetaminophen  (TYLENOL ) 500 MG tablet Take 500-1,000 mg by mouth every 8 (eight) hours as needed.     AIRSUPRA 90-80 MCG/ACT AERO Inhale 2 puffs into the lungs every 2 (two) hours as needed (shortness of breath).     doxycycline  (VIBRA -TABS) 100 MG tablet Take 1 tablet (100 mg total) by mouth 2 (two) times daily. 20 tablet 0   doxycycline  (VIBRA -TABS) 100 MG tablet Take 1 tablet (100 mg total) by mouth 2 (two) times daily. 14 tablet 0   EPINEPHrine  0.3 mg/0.3 mL IJ SOAJ injection Inject 0.3 mg into the muscle as needed for anaphylaxis.     esomeprazole (NEXIUM) 40 MG capsule Take 40 mg by mouth daily at 12 noon.     fluticasone-salmeterol (ADVAIR  DISKUS) 250-50 MCG/ACT AEPB Inhale 1 puff into the lungs in the morning and at bedtime. 180 each 6   ibuprofen  (ADVIL ,MOTRIN ) 600 MG tablet TAKE 1 TABLET BY MOUTH FOUR TIMES A DAY FOR 5 DAYS THEN EVERY 6 HOURS IF NEEDED  0   levocetirizine (XYZAL) 2.5 MG/5ML solution Take 2.5 mg by mouth every evening.     montelukast  (SINGULAIR ) 10 MG tablet Take 1 tablet (10 mg total) by mouth at bedtime. 30 tablet 11   Multiple Vitamins-Minerals (MULTIVITAMIN WITH MINERALS) tablet Take 1 tablet by mouth  daily.     oxyCODONE  (OXY IR/ROXICODONE ) 5 MG immediate release tablet Take 1 tablet (5 mg total) by mouth every 6 (six) hours as needed for moderate pain (pain score 4-6) or severe pain (pain score 7-10). 20 tablet 0   sertraline (ZOLOFT) 100 MG tablet Take 100 mg by mouth daily.     tamoxifen (NOLVADEX) 20 MG tablet Take 1 tablet (20 mg total) by mouth daily. 90 tablet 3   No current facility-administered medications for this encounter.    Physical Findings: The patient is in no acute distress. Patient is alert  and oriented.  vitals were not taken for this visit. .  No significant changes. Lungs are clear to auscultation bilaterally. Heart has regular rate and rhythm. No palpable cervical, supraclavicular, or axillary adenopathy. Abdomen soft, non-tender, normal bowel sounds.  Right Breast: no palpable mass, nipple discharge or bleeding. Left Breast: ***  Lab Findings: Lab Results  Component Value Date   WBC 13.6 (H) 08/15/2023   HGB 10.9 (L) 08/15/2023   HCT 34.2 (L) 08/15/2023   MCV 85.7 08/15/2023   PLT 226 08/15/2023    Radiographic Findings: No results found.  Impression: Stage IA (cT1c, cN0, cM0) Left Breast UOQ, Invasive and in situ ductal carcinoma, ER+ / PR+ / Her2-, Grade 2  The patient is recovering from the effects of radiation.  ***  Plan:  ***   *** minutes of total time was spent for this patient encounter, including preparation, face-to-face counseling with the patient and coordination of care, physical exam, and documentation of the encounter. ____________________________________  Lynwood CHARM Nasuti, PhD, MD  This document serves as a record of services personally performed by Lynwood Nasuti, MD. It was created on his behalf by Dorthy Fuse, a trained medical scribe. The creation of this record is based on the scribe's personal observations and the provider's statements to them. This document has been checked and approved by the attending provider.

## 2023-11-22 ENCOUNTER — Telehealth: Payer: Self-pay | Admitting: Radiation Oncology

## 2023-11-22 ENCOUNTER — Ambulatory Visit: Admitting: Radiation Oncology

## 2023-11-22 ENCOUNTER — Ambulatory Visit
Admission: RE | Admit: 2023-11-22 | Discharge: 2023-11-22 | Disposition: A | Discharge status: Home / Self Care | Source: Ambulatory Visit | Attending: Radiation Oncology | Admitting: Radiation Oncology

## 2023-11-22 NOTE — Telephone Encounter (Signed)
 11/17 @ 4:33 pm Left voicemail to r/s patient for her missed follow up appt on today.

## 2023-11-22 NOTE — Progress Notes (Incomplete)
 Radiation Oncology         [phone] ________________________________  Name: Jenny Hudson MRN: [id number]  Date: 11/22/2023  DOB: [birth year]  Follow-Up Visit Note  CC: Dwight Trula SHAUNNA, MD  Dwight Trula SHAUNNA, MD  No diagnosis found. ***  Diagnosis:   Stage IA (cT1c, cN0, cM0) Left Breast UOQ, Invasive and in situ ductal carcinoma, ER+ / PR+ / Her2-, Grade 2  Interval Since Last Radiation: 1 month and 9 days  Intent: Curative  Radiation Treatment Dates: First Treatment Date: 2023-09-13 -- Last Treatment Date: 2023-10-13 Site/Dose/Technique/Mode:  Plan Name: Breast_L_BH Site: Breast, Left Technique: 3D Mode: Photon Dose Per Fraction: 2.67 Gy Prescribed Dose (Delivered / Prescribed): 40.05 Gy / 40.05 Gy Prescribed Fxs (Delivered / Prescribed): 15 / 15   Plan Name: Brst_L_Bst_BH Site: Breast, Left Technique: 3D Mode: Photon Dose Per Fraction: 2 Gy Prescribed Dose (Delivered / Prescribed): 10 Gy / 10 Gy Prescribed Fxs (Delivered / Prescribed): 5 / 5  Narrative:  The patient returns today for routine follow-up visit after completing radiation therapy. She tolerated radiation therapy relatively well overall other than fatigue and anticipated skin changes in the treatment field. She was given triple antibiotic ointment for this as a precaution and instructed to apply it along the axillary and inframammary fold areas.  While undergoing radiation therapy, she followed up with Dr. Gudena on 10/04/23. She did endorse significant fatigue at that time, and also reported having irregular menstrual cycles (perimenopausal status).  With regards to antiestrogen therapy, she agreed to proceed with tamoxifen which she began taking on 11/01. Dr. Odean would like to change this to anastrozole once she reaches menopause.     No other significant oncologic interval history since the patient completed radiation therapy. She will continue to present to OP rehab for SOZO screenings to assess her  lymphedema risk.   ***                          Allergies:  is allergic to sulfa antibiotics.  Meds: Current Outpatient Medications  Medication Sig Dispense Refill   acetaminophen  (TYLENOL ) 500 MG tablet Take 500-1,000 mg by mouth every 8 (eight) hours as needed.     AIRSUPRA 90-80 MCG/ACT AERO Inhale 2 puffs into the lungs every 2 (two) hours as needed (shortness of breath).     doxycycline  (VIBRA -TABS) 100 MG tablet Take 1 tablet (100 mg total) by mouth 2 (two) times daily. 20 tablet 0   doxycycline  (VIBRA -TABS) 100 MG tablet Take 1 tablet (100 mg total) by mouth 2 (two) times daily. 14 tablet 0   EPINEPHrine  0.3 mg/0.3 mL IJ SOAJ injection Inject 0.3 mg into the muscle as needed for anaphylaxis.     esomeprazole (NEXIUM) 40 MG capsule Take 40 mg by mouth daily at 12 noon.     fluticasone-salmeterol (ADVAIR  DISKUS) 250-50 MCG/ACT AEPB Inhale 1 puff into the lungs in the morning and at bedtime. 180 each 6   ibuprofen  (ADVIL ,MOTRIN ) 600 MG tablet TAKE 1 TABLET BY MOUTH FOUR TIMES A DAY FOR 5 DAYS THEN EVERY 6 HOURS IF NEEDED  0   levocetirizine (XYZAL) 2.5 MG/5ML solution Take 2.5 mg by mouth every evening.     montelukast  (SINGULAIR ) 10 MG tablet Take 1 tablet (10 mg total) by mouth at bedtime. 30 tablet 11   Multiple Vitamins-Minerals (MULTIVITAMIN WITH MINERALS) tablet Take 1 tablet by mouth daily.     oxyCODONE  (OXY IR/ROXICODONE ) 5 MG immediate release tablet  Take 1 tablet (5 mg total) by mouth every 6 (six) hours as needed for moderate pain (pain score 4-6) or severe pain (pain score 7-10). 20 tablet 0   sertraline (ZOLOFT) 100 MG tablet Take 100 mg by mouth daily.     tamoxifen (NOLVADEX) 20 MG tablet Take 1 tablet (20 mg total) by mouth daily. 90 tablet 3   No current facility-administered medications for this encounter.    Physical Findings: The patient is in no acute distress. Patient is alert and oriented.  vitals were not taken for this visit. .  No significant changes. Lungs  are clear to auscultation bilaterally. Heart has regular rate and rhythm. No palpable cervical, supraclavicular, or axillary adenopathy. Abdomen soft, non-tender, normal bowel sounds.  Right Breast: no palpable mass, nipple discharge or bleeding. Left Breast: ***  Lab Findings: Lab Results  Component Value Date   WBC 13.6 (H) 08/15/2023   HGB 10.9 (L) 08/15/2023   HCT 34.2 (L) 08/15/2023   MCV 85.7 08/15/2023   PLT 226 08/15/2023    Radiographic Findings: No results found.  Impression/Plan: Stage IA (cT1c, cN0, cM0) Left Breast UOQ, Invasive and in situ ductal carcinoma, ER+ / PR+ / Her2-, Grade 2  Patient has healed well from the effects of her radiation treatment. She will continue on Tamoxifen under the care of Dr. Odean. She is scheduled to see Morna Kendall for survivorship clinic on 01/13/2023. Radiation follow-up PRN. We appreciate the opportunity to take part in this patient's care. She has been advised to call back with any questions or concerns.    20 minutes of total time was spent for this patient encounter, including preparation, face-to-face counseling with the patient and coordination of care, physical exam, and documentation of the encounter. ____________________________________   Leeroy Due, PA-C   This document serves as a record of services personally performed by Leeroy Due, PA-C. It was created on her behalf by Dorthy Fuse, a trained medical scribe. The creation of this record is based on the scribe's personal observations and the provider's statements to them. This document has been checked and approved by the attending provider.

## 2023-11-25 ENCOUNTER — Ambulatory Visit
Admission: RE | Admit: 2023-11-25 | Discharge: 2023-11-25 | Disposition: A | Discharge status: Home / Self Care | Source: Ambulatory Visit | Attending: Radiation Oncology | Admitting: Radiation Oncology

## 2023-11-25 ENCOUNTER — Encounter: Payer: Self-pay | Admitting: Radiation Oncology

## 2023-11-25 VITALS — BP 125/73 | HR 84 | Temp 97.3°F | Resp 18 | Ht 67.0 in | Wt 213.4 lb

## 2023-11-25 DIAGNOSIS — C50412 Malignant neoplasm of upper-outer quadrant of left female breast: Secondary | ICD-10-CM

## 2023-11-25 NOTE — Progress Notes (Signed)
 MOET MIKULSKI is here today for follow up post radiation to the breast.   Breast Side: Left   They completed their radiation on: 10/13/23   Does the patient complain of any of the following: Post radiation skin issues:  Skin has healed  Breast Tenderness: Discomfort at times.  Breast Swelling: No Lymphadema: No Range of Motion limitations: No Fatigue post radiation: Yes Appetite good/fair/poor: good   Additional comments if applicable:  Patient started tamoxifen on 11/06/23. Patient having hot and cold flashes.   BP 125/73 (BP Location: Left Arm, Patient Position: Sitting)   Pulse 84   Temp (!) 97.3 F (36.3 C) (Temporal)   Resp 18   Ht 5' 7 (1.702 m)   Wt 213 lb 6 oz (96.8 kg)   SpO2 96%   BMI 33.42 kg/m

## 2023-11-25 NOTE — Progress Notes (Incomplete)
 Radiation Oncology         [phone] ________________________________  Name: Jenny Hudson MRN: [id number]  Date: 11/25/2023  DOB: 1970-09-05  Follow-Up Visit Note  CC: Dwight Trula SHAUNNA, MD  Dwight Trula SHAUNNA, MD    ICD-10-CM   1. Malignant neoplasm of upper-outer quadrant of left breast in female, estrogen receptor positive (HCC)  C50.412    Z17.0        Diagnosis:   Stage IA (cT1c, cN0, cM0) Left Breast UOQ, Invasive and in situ ductal carcinoma, ER+ / PR+ / Her2-, Grade 2  Interval Since Last Radiation: 1 month  Intent: Curative  Radiation Treatment Dates: First Treatment Date: 2023-09-13 -- Last Treatment Date: 2023-10-13 Site/Dose/Technique/Mode:  Plan Name: Breast_L_BH Site: Breast, Left Technique: 3D Mode: Photon Dose Per Fraction: 2.67 Gy Prescribed Dose (Delivered / Prescribed): 40.05 Gy / 40.05 Gy Prescribed Fxs (Delivered / Prescribed): 15 / 15   Plan Name: Brst_L_Bst_BH Site: Breast, Left Technique: 3D Mode: Photon Dose Per Fraction: 2 Gy Prescribed Dose (Delivered / Prescribed): 10 Gy / 10 Gy Prescribed Fxs (Delivered / Prescribed): 5 / 5  Narrative:  The patient returns today for routine follow-up visit after completing radiation therapy. She tolerated radiation therapy relatively well overall other than fatigue and anticipated skin changes in the treatment field. She was given triple antibiotic ointment for this as a precaution and instructed to apply it along the axillary and inframammary fold areas.  While undergoing radiation therapy, she followed up with Dr. Gudena on 10/04/23. She did endorse significant fatigue at that time, and also reported having irregular menstrual cycles (perimenopausal status).  With regards to antiestrogen therapy, she agreed to proceed with tamoxifen which she began taking on 11/01. Dr. Odean would like to change this to anastrozole once she reaches menopause.     No other significant oncologic interval history since the  patient completed radiation therapy. She will continue to present to OP rehab for SOZO screenings to assess her lymphedema risk.   Patient reports to be doing well overall today. She notes ***                          Allergies:  is allergic to sulfa antibiotics.  Meds: Current Outpatient Medications  Medication Sig Dispense Refill   acetaminophen  (TYLENOL ) 500 MG tablet Take 500-1,000 mg by mouth every 8 (eight) hours as needed.     AIRSUPRA 90-80 MCG/ACT AERO Inhale 2 puffs into the lungs every 2 (two) hours as needed (shortness of breath).     EPINEPHrine  0.3 mg/0.3 mL IJ SOAJ injection Inject 0.3 mg into the muscle as needed for anaphylaxis.     esomeprazole (NEXIUM) 40 MG capsule Take 40 mg by mouth daily at 12 noon.     fluticasone-salmeterol (ADVAIR  DISKUS) 250-50 MCG/ACT AEPB Inhale 1 puff into the lungs in the morning and at bedtime. 180 each 6   ibuprofen  (ADVIL ,MOTRIN ) 600 MG tablet TAKE 1 TABLET BY MOUTH FOUR TIMES A DAY FOR 5 DAYS THEN EVERY 6 HOURS IF NEEDED  0   levocetirizine (XYZAL) 2.5 MG/5ML solution Take 2.5 mg by mouth every evening.     montelukast  (SINGULAIR ) 10 MG tablet Take 1 tablet (10 mg total) by mouth at bedtime. 30 tablet 11   Multiple Vitamins-Minerals (MULTIVITAMIN WITH MINERALS) tablet Take 1 tablet by mouth daily.     sertraline (ZOLOFT) 100 MG tablet Take 100 mg by mouth daily.     tamoxifen (NOLVADEX)  20 MG tablet Take 1 tablet (20 mg total) by mouth daily. 90 tablet 3   No current facility-administered medications for this encounter.    Physical Findings: The patient is in no acute distress. Patient is alert and oriented.  height is 5' 7 (1.702 m) and weight is 213 lb 6 oz (96.8 kg). Her temporal temperature is 97.3 F (36.3 C) (abnormal). Her blood pressure is 125/73 and her pulse is 84. Her respiration is 18 and oxygen saturation is 96%. .  No significant changes. Lungs are clear to auscultation bilaterally. Heart has regular rate and rhythm. No  palpable cervical, supraclavicular, or axillary adenopathy. Abdomen soft, non-tender, normal bowel sounds.  Right Breast: no palpable mass, nipple discharge or bleeding. Left Breast: ***  Lab Findings: Lab Results  Component Value Date   WBC 13.6 (H) 08/15/2023   HGB 10.9 (L) 08/15/2023   HCT 34.2 (L) 08/15/2023   MCV 85.7 08/15/2023   PLT 226 08/15/2023    Radiographic Findings: No results found.  Impression/Plan: Stage IA (cT1c, cN0, cM0) Left Breast UOQ, Invasive and in situ ductal carcinoma, ER+ / PR+ / Her2-, Grade 2  Patient has healed well from the effects of her radiation treatment. She will continue on Tamoxifen under the care of Dr. Odean. She is scheduled to see Morna Kendall for survivorship clinic on 01/13/2023. Radiation follow-up PRN. We appreciate the opportunity to take part in this patient's care. She has been advised to call back with any questions or concerns.    20 minutes of total time was spent for this patient encounter, including preparation, face-to-face counseling with the patient and coordination of care, physical exam, and documentation of the encounter. ____________________________________   Leeroy Due, PA-C   This document serves as a record of services personally performed by Leeroy Due, PA-C. It was created on her behalf by Dorthy Fuse, a trained medical scribe. The creation of this record is based on the scribe's personal observations and the provider's statements to them. This document has been checked and approved by the attending provider.

## 2024-01-13 ENCOUNTER — Encounter: Payer: Self-pay | Admitting: Adult Health

## 2024-01-13 ENCOUNTER — Inpatient Hospital Stay: Attending: Adult Health | Admitting: Adult Health

## 2024-01-13 ENCOUNTER — Inpatient Hospital Stay

## 2024-01-13 VITALS — BP 140/76 | HR 85 | Temp 98.2°F | Resp 18 | Wt 215.4 lb

## 2024-01-13 DIAGNOSIS — Z882 Allergy status to sulfonamides status: Secondary | ICD-10-CM | POA: Diagnosis not present

## 2024-01-13 DIAGNOSIS — G40909 Epilepsy, unspecified, not intractable, without status epilepticus: Secondary | ICD-10-CM | POA: Insufficient documentation

## 2024-01-13 DIAGNOSIS — Z1721 Progesterone receptor positive status: Secondary | ICD-10-CM | POA: Insufficient documentation

## 2024-01-13 DIAGNOSIS — C50412 Malignant neoplasm of upper-outer quadrant of left female breast: Secondary | ICD-10-CM | POA: Diagnosis not present

## 2024-01-13 DIAGNOSIS — N6489 Other specified disorders of breast: Secondary | ICD-10-CM | POA: Diagnosis not present

## 2024-01-13 DIAGNOSIS — Z1732 Human epidermal growth factor receptor 2 negative status: Secondary | ICD-10-CM | POA: Diagnosis not present

## 2024-01-13 DIAGNOSIS — Z800 Family history of malignant neoplasm of digestive organs: Secondary | ICD-10-CM | POA: Insufficient documentation

## 2024-01-13 DIAGNOSIS — Z79810 Long term (current) use of selective estrogen receptor modulators (SERMs): Secondary | ICD-10-CM | POA: Insufficient documentation

## 2024-01-13 DIAGNOSIS — Z825 Family history of asthma and other chronic lower respiratory diseases: Secondary | ICD-10-CM | POA: Diagnosis not present

## 2024-01-13 DIAGNOSIS — Z170 Estrogen receptor positive status [ER+]: Secondary | ICD-10-CM | POA: Diagnosis not present

## 2024-01-13 DIAGNOSIS — Z808 Family history of malignant neoplasm of other organs or systems: Secondary | ICD-10-CM | POA: Insufficient documentation

## 2024-01-13 DIAGNOSIS — Z923 Personal history of irradiation: Secondary | ICD-10-CM | POA: Insufficient documentation

## 2024-01-13 DIAGNOSIS — F419 Anxiety disorder, unspecified: Secondary | ICD-10-CM | POA: Diagnosis not present

## 2024-01-13 DIAGNOSIS — Z801 Family history of malignant neoplasm of trachea, bronchus and lung: Secondary | ICD-10-CM | POA: Insufficient documentation

## 2024-01-13 DIAGNOSIS — J45909 Unspecified asthma, uncomplicated: Secondary | ICD-10-CM | POA: Diagnosis not present

## 2024-01-13 DIAGNOSIS — Z83438 Family history of other disorder of lipoprotein metabolism and other lipidemia: Secondary | ICD-10-CM | POA: Diagnosis not present

## 2024-01-13 DIAGNOSIS — Z803 Family history of malignant neoplasm of breast: Secondary | ICD-10-CM | POA: Diagnosis not present

## 2024-01-13 DIAGNOSIS — Z8249 Family history of ischemic heart disease and other diseases of the circulatory system: Secondary | ICD-10-CM | POA: Diagnosis not present

## 2024-01-13 DIAGNOSIS — Z79899 Other long term (current) drug therapy: Secondary | ICD-10-CM | POA: Insufficient documentation

## 2024-01-13 DIAGNOSIS — F17290 Nicotine dependence, other tobacco product, uncomplicated: Secondary | ICD-10-CM | POA: Diagnosis not present
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
## 2024-01-24 LAB — GUARDANT REVEAL

## 2024-01-25 ENCOUNTER — Encounter: Payer: Self-pay | Admitting: Adult Health

## 2024-02-14 ENCOUNTER — Ambulatory Visit: Attending: General Surgery

## 2024-07-13 ENCOUNTER — Inpatient Hospital Stay

## 2024-07-13 ENCOUNTER — Inpatient Hospital Stay: Admitting: Hematology and Oncology
